# Patient Record
Sex: Female | Born: 1960 | Race: White | Hispanic: No | Marital: Single | Smoking: Never smoker
Health system: Southern US, Community
[De-identification: ages and names within clinical notes are randomized; demographics above are authoritative.]

## PROBLEM LIST (undated history)

## (undated) ENCOUNTER — Emergency Department (HOSPITAL_COMMUNITY)
Admission: EM | Disposition: A | Discharge status: Home / Self Care | Payer: Medicare Other | Attending: Otolaryngology | Admitting: Otolaryngology

## (undated) DIAGNOSIS — R51 Headache: Secondary | ICD-10-CM

## (undated) DIAGNOSIS — R0602 Shortness of breath: Secondary | ICD-10-CM

## (undated) DIAGNOSIS — M797 Fibromyalgia: Secondary | ICD-10-CM

## (undated) DIAGNOSIS — G4730 Sleep apnea, unspecified: Secondary | ICD-10-CM

## (undated) DIAGNOSIS — G709 Myoneural disorder, unspecified: Secondary | ICD-10-CM

## (undated) DIAGNOSIS — F32A Depression, unspecified: Secondary | ICD-10-CM

## (undated) DIAGNOSIS — N179 Acute kidney failure, unspecified: Secondary | ICD-10-CM

## (undated) DIAGNOSIS — M1990 Unspecified osteoarthritis, unspecified site: Secondary | ICD-10-CM

## (undated) DIAGNOSIS — J189 Pneumonia, unspecified organism: Secondary | ICD-10-CM

## (undated) DIAGNOSIS — G8929 Other chronic pain: Secondary | ICD-10-CM

## (undated) DIAGNOSIS — F419 Anxiety disorder, unspecified: Secondary | ICD-10-CM

## (undated) DIAGNOSIS — F329 Major depressive disorder, single episode, unspecified: Secondary | ICD-10-CM

## (undated) DIAGNOSIS — J069 Acute upper respiratory infection, unspecified: Secondary | ICD-10-CM

## (undated) DIAGNOSIS — C539 Malignant neoplasm of cervix uteri, unspecified: Secondary | ICD-10-CM

## (undated) HISTORY — PX: TRACHEOSTOMY: SUR1362

## (undated) HISTORY — PX: NECK SURGERY: SHX720

---

## 2001-10-10 ENCOUNTER — Encounter: Payer: Self-pay | Admitting: General Practice

## 2001-10-10 ENCOUNTER — Ambulatory Visit (HOSPITAL_COMMUNITY): Admission: RE | Admit: 2001-10-10 | Discharge: 2001-10-10 | Payer: Self-pay | Admitting: General Practice

## 2001-11-15 ENCOUNTER — Ambulatory Visit (HOSPITAL_COMMUNITY): Admission: RE | Admit: 2001-11-15 | Discharge: 2001-11-15 | Payer: Self-pay | Admitting: Orthopedic Surgery

## 2002-09-14 ENCOUNTER — Inpatient Hospital Stay (HOSPITAL_COMMUNITY): Admission: EM | Admit: 2002-09-14 | Discharge: 2002-09-16 | Payer: Self-pay | Admitting: Emergency Medicine

## 2002-09-14 ENCOUNTER — Encounter: Payer: Self-pay | Admitting: Emergency Medicine

## 2006-09-22 ENCOUNTER — Emergency Department (HOSPITAL_COMMUNITY): Admission: EM | Admit: 2006-09-22 | Discharge: 2006-09-22 | Payer: Self-pay | Admitting: Emergency Medicine

## 2006-09-24 ENCOUNTER — Inpatient Hospital Stay (HOSPITAL_COMMUNITY): Admission: EM | Admit: 2006-09-24 | Discharge: 2006-09-27 | Payer: Self-pay | Admitting: Emergency Medicine

## 2006-09-24 ENCOUNTER — Ambulatory Visit: Payer: Self-pay | Admitting: Family Medicine

## 2006-11-01 ENCOUNTER — Ambulatory Visit (HOSPITAL_COMMUNITY): Admission: RE | Admit: 2006-11-01 | Discharge: 2006-11-01 | Payer: Self-pay | Admitting: Neurological Surgery

## 2008-10-03 ENCOUNTER — Inpatient Hospital Stay (HOSPITAL_COMMUNITY): Admission: EM | Admit: 2008-10-03 | Discharge: 2008-10-06 | Payer: Self-pay | Admitting: Emergency Medicine

## 2008-10-03 ENCOUNTER — Ambulatory Visit: Payer: Self-pay | Admitting: Family Medicine
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
## 2011-03-15 NOTE — Discharge Summary (Signed)
NAMESHAMAR, KRACKE             ACCOUNT NO.:  [id number]   MEDICAL RECORD NO.:  [id number]          PATIENT TYPE:  INP   LOCATION:  5532                         FACILITY:  [facility]   PHYSICIAN:  Pearlean Brownie, M.D.DATE OF BIRTH:  Jul 12, 1961   DATE OF ADMISSION:  10/03/2008  DATE OF DISCHARGE:  10/05/2008                               DISCHARGE SUMMARY   REASON FOR HOSPITALIZATION:  Presyncopal event, myalgias, possible  aspiration pneumonia, and dehydration.   FOLLOW UP APPOINTMENTS:  The patient is to follow up with Dr. Steva Ready,  phone number is [phone].  She will need an appointment in 1-3 days  after discharge.  I did call the office and leave a message regarding  this since it was a Sunday.  The patient was also instructed to call  tomorrow for an appointment.   DISCHARGE MEDICATIONS:  1. Percocet 10/325 mg 1 tablet three times a day.  2. Zanaflex 8 mg 1 tablet three times a day.  3. MS Contin 30 mg 1 tablet two times a day.  4. Valium 5 mg 1 tablet three times a day.   These prescriptions were given as she has a pain contract with her  orthopedist and she is to be continued on all of her home medications,  he is the one who prescribed this.  The patient is to stop taking her  Celebrex as well as her Toprol-XL.  She will need to discuss taking the  Toprol-XL with her primary care doctor as her blood pressure was low  during this admission.  We want to make sure that blood pressure does  not drop because she was taking this medicine.   LABORATORY DATA:  The patient does have two blood cultures that were  drawn, December 4, that are currently no growth to date.  A BMET drawn  December 6 on discharge was completely normal.  Sodium 142, potassium  4.3, chloride 109, bicarb 27, glucose 88, BUN 6, creatinine 0.92,  calcium 8.9.  CBC, on December 4 was normal.  White blood cell count  9.1, her hemoglobin was a little bit low at 11.4, platelets were 283.  Urinalysis showed  small leukocyte esterase, no nitrites, large blood, 15  ketones, small bilirubin, specific gravity 1.035.  Microscopic showed  many squamous epithelial cells, many bacteria, hyaline casts, and  oxalate crystals.  Urine pregnancy test was negative.  Chest x-ray done  on December 4, showed right middle and lower lobe air space disease  consistent with pneumonia.  Follow up plain films recommended.  Films on  December 5, showed minimal improvement in right lower lobe pneumonia,  subsegmental atelectasis, right middle lobe was again noted.  Head CT  done December 4, was negative for bleed or other acute intracranial  process.  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   DISCHARGE DIAGNOSES:  Dehydration, hypotension, questionable pneumonia,  chronic pain, and acute renal failure.   DISCHARGE CONDITION:  The patient is stable.  Upon discharge, she says  that she is quite sleepy; however, she is afebrile and off antibiotics,  and feels like  she could take her medicine and sleep at home as well as  here in the hospital.   HOSPITAL COURSE:  1. Presyncope/dehydration/hypotension.  The patient was admitted for a      presyncopal episode, also was found to be very dehydrated.  She was      rehydrated with a few boluses as well as IV fluids, and currently      is taking p.o. quite well, and drinking normally and eating      normally.  She was a little hypotensive on admission; however, this      resolved status post hydration.  This was presumed to be due to      dehydration.  2. Possible pneumonia.  The patient did have chest x-rays consistent      with pneumonia; however, she was given a Z-Pak the week prior to      her admission.  She was given a couple days of azithromycin and      ceftriaxone here, but they were stopped the day prior to discharge      as she remained afebrile with a normal white blood cell count.  It      is possible that she has a virus  that is causing her coarse cough,      making her look sleepy and tired.  Discussed with Dr. Deirdre Priest      prior to discharge.  We will not send her home on any more      antibiotics as she was previously treated for this and seems to be      doing better in regards to breathing and having no fever.  She did      receive the pneumonia vaccine here in the hospital.  3. Acute renal failure.  The patient's creatinine initially was 1.6;      however, on discharge was 0.92 and resolved after rehydration, this      is likely secondary to dehydration.  4. Chronic pain.  The patient with a pain contract with her      orthopedist.  We continued her on home medications.  She will be      discharged on her home medications as well without any      prescriptions as she has already had these at home.  These      medications are, Percocet 10/325 three times a day, Zanaflex 8 mg      three times a day, MS Contin 30 mg two times a day, and Valium 5 mg      three times a day.  For prophylaxis, the patient was put on Lovenox      here in the hospital.   DISPOSITION:  The patient did ambulate here with Dr. Deirdre Priest without  too much of a problem and the decision was made to discharge her home  today.  She will follow up with Dr. Steva Ready, her primary doctor.   FOLLOW UP ISSUES:  1. The patient had blood cultures to follow in the hospital.  2. The patient will need follow up in the next couple days for her      possible pneumonia/cough just to make sure she is doing okay.  The      patient was informed that she does have fevers let us know.   DISCHARGE MEDICATIONS:  1. Percocet 10/325 mg 3 tablets a day.  2. Zanaflex 8 mg 1 tablet three times a day.  3. MS Contin 30 mg 1 tablet 3  times a day.  4. Valium 5 mg 1 tablet three times a day.      Alanda Amass, M.D.  Electronically Signed      Pearlean Brownie, M.D.  Electronically Signed    JH/MEDQ  D:  10/05/2008  T:  10/05/2008  Job:   [id number]

## 2011-03-15 NOTE — Discharge Summary (Signed)
NAMETRULEE, HAMSTRA             ACCOUNT NO.:  [id number]   MEDICAL RECORD NO.:  [id number]          PATIENT TYPE:  INP   LOCATION:  5532                         FACILITY:  [facility]   PHYSICIAN:  Pearlean Brownie, M.D.DATE OF BIRTH:  29-Mar-1961   DATE OF ADMISSION:  10/03/2008  DATE OF DISCHARGE:  10/06/2008                               DISCHARGE SUMMARY   ADDENDUM:  The patient was scheduled to be discharged on October 05, 2008, but was held overnight due to her elevated blood pressures of  192/102, 172/96, and 173/98.  She was started on metoprolol 25 mg b.i.d.  with a subsequent morning blood pressure of 151/100.  After speaking  with her primary care physician's office, we did restart her amlodipine  5 mg 1-1/2 tablets per day as well as adding lisinopril 10 mg daily for  blood pressure control.  We decided not to increase the metoprolol dose  to 50 mg b.i.d. as her previous prescription indicated as her heart rate  was maintained in the 60s-70s.  I did speak with Ms. Knapper's primary  care physician, Dr. Richardean Sale office who indicated that she was seen on  July 21, 2008.  Prescriptions prescribed by Dr. Steva Ready include  pravastatin, metoprolol 50 mg b.i.d., and amlodipine 5 mg 1-1/2 tablets  daily.  I did make a followup appointment for her for Wednesday,  October 08, 2008, at 9:45 a.m.  We spoke with Dr. Thad Ranger at [facility] and verified that Ms. Samet has a Optician, dispensing with him and  that he prescribed the following:  1. Celebrex 200 mg daily.  2. MS Contin 30 mg p.o. b.i.d.  3. Zanaflex 8 mg t.i.d.  4. Oxycodone 10/325 one p.o. t.i.d. #90.   Ms. Michalski was last seen on September 22, 2008, at his office.  I  discussed with Ms. Manson Passey and her husband her blood pressure and blood  pressure medicines, explained her new regimen until she sees her primary  care physician.  She denied any chest pain, any increased shortness of  breath, any headaches, or any  vision changes.  She was instructed to  call the emergency department, call her PCP, or go to the emergency  department if she experiences chest pain, shortness of breath,  headaches, vision changes, or any other concerns.      Helane Rima, MD  Electronically Signed      Pearlean Brownie, M.D.  Electronically Signed    EW/MEDQ  D:  10/06/2008  T:  10/07/2008  Job:  [id number]
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
## 2011-03-18 NOTE — Op Note (Signed)
[facility]  Patient:    Whitney, Marks Visit Number: [id number] MRN: [id number]          Service Type: DSU Location: DAY Attending Physician:  Skip Mayer Dictated by:   Georges Lynch Darrelyn Hillock, M.D. Proc. Date: 11/15/01 Admit Date:  11/15/2001 Discharge Date: 11/15/2001                             Operative Report  SURGEON:  Windy Fast A. Darrelyn Hillock, M.D.  ASSISTANT:  Nurse.  PREOPERATIVE DIAGNOSIS:  Adhesive capsulitis with bursitis, left shoulder.  POSTOPERATIVE DIAGNOSIS:  Adhesive capsulitis with bursitis, left shoulder.  OPERATION: 1. Closed manipulation under general anesthesia of the left shoulder. 2. Injection left shoulder with 1 cc of Depo-Medrol, 5 cc of 0.5% Marcaine    with epinephrine.  DESCRIPTION OF PROCEDURE:  Under general anesthesia, a closed manipulation was carried out of the left shoulder with ease. Following this, I then did a sterile prep of her right shoulder and injected her with 1 cc of Depo-Medrol, 5 cc of 0.5% Marcaine. She did return to the recovery room with no problems. She will be placed in a sling and have ice to the shoulder and she will be seen in the office to start therapy on Monday, January 20, or prior to that for some problems. Dictated by:   Georges Lynch Darrelyn Hillock, M.D. Attending Physician:  Skip Mayer DD:  11/15/01 TD:  11/18/01 Job: [id number]
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
## 2011-08-04 LAB — BASIC METABOLIC PANEL
BUN: 11 mg/dL (ref 6–23)
Calcium: 8.9 mg/dL (ref 8.4–10.5)
Calcium: 9.1 mg/dL (ref 8.4–10.5)
Chloride: 112 mEq/L (ref 96–112)
Creatinine, Ser: 0.84 mg/dL (ref 0.4–1.2)
Creatinine, Ser: 0.85 mg/dL (ref 0.4–1.2)
GFR calc Af Amer: >60 mL/min (ref >60)
Glucose, Bld: 116 mg/dL — ABNORMAL HIGH (ref 70–99)
Potassium: 3.6 mEq/L (ref 3.5–5.1)
Potassium: 4.2 mEq/L (ref 3.5–5.1)
Potassium: 4.3 mEq/L (ref 3.5–5.1)

## 2011-08-04 LAB — URINALYSIS, ROUTINE W REFLEX MICROSCOPIC
Glucose, UA: NEGATIVE mg/dL
Ketones, ur: 15 mg/dL — AB
Specific Gravity, Urine: 1.035 — ABNORMAL HIGH (ref 1.005–1.030)
Urobilinogen, UA: 1 mg/dL (ref 0.0–1.0)
pH: 6 (ref 5.0–8.0)

## 2011-08-04 LAB — CULTURE, BLOOD (ROUTINE X 2): Culture: NO GROWTH

## 2011-08-04 LAB — CBC
MCHC: 33.9 g/dL (ref 30.0–36.0)
MCHC: 34.8 g/dL (ref 30.0–36.0)
MCV: 90.3 fL (ref 78.0–100.0)
RBC: 3.73 MIL/uL — ABNORMAL LOW (ref 3.87–5.11)
RDW: 13 % (ref 11.5–15.5)
RDW: 13.1 % (ref 11.5–15.5)
WBC: 9.1 10*3/uL (ref 4.0–10.5)

## 2011-08-04 LAB — POCT I-STAT, CHEM 8
BUN: 22 mg/dL (ref 6–23)
Creatinine, Ser: 1.6 mg/dL — ABNORMAL HIGH (ref 0.4–1.2)
Hemoglobin: 13.9 g/dL (ref 12.0–15.0)
Potassium: 3 mEq/L — ABNORMAL LOW (ref 3.5–5.1)
Sodium: 141 mEq/L (ref 135–145)

## 2011-08-04 LAB — DIFFERENTIAL
Eosinophils Absolute: 0.7 10*3/uL (ref 0.0–0.7)
Lymphocytes Relative: 30 % (ref 12–46)
Neutrophils Relative %: 58 % (ref 43–77)

## 2011-08-04 LAB — POCT PREGNANCY, URINE: Preg Test, Ur: NEGATIVE

## 2011-10-30 ENCOUNTER — Emergency Department (HOSPITAL_COMMUNITY): Payer: Medicare Other

## 2011-10-30 ENCOUNTER — Other Ambulatory Visit: Payer: Self-pay

## 2011-10-30 ENCOUNTER — Inpatient Hospital Stay (HOSPITAL_COMMUNITY): Payer: Medicare Other

## 2011-10-30 ENCOUNTER — Inpatient Hospital Stay (HOSPITAL_COMMUNITY)
Admission: EM | Admit: 2011-10-30 | Discharge: 2011-11-04 | DRG: 871 | Disposition: A | Discharge status: Home Health | Payer: Medicare Other | Attending: Internal Medicine | Admitting: Internal Medicine

## 2011-10-30 DIAGNOSIS — G9340 Encephalopathy, unspecified: Secondary | ICD-10-CM

## 2011-10-30 DIAGNOSIS — R6520 Severe sepsis without septic shock: Secondary | ICD-10-CM

## 2011-10-30 DIAGNOSIS — J690 Pneumonitis due to inhalation of food and vomit: Secondary | ICD-10-CM

## 2011-10-30 DIAGNOSIS — S279XXS Injury of unspecified intrathoracic organ, sequela: Secondary | ICD-10-CM

## 2011-10-30 DIAGNOSIS — R4182 Altered mental status, unspecified: Secondary | ICD-10-CM | POA: Diagnosis present

## 2011-10-30 DIAGNOSIS — R5081 Fever presenting with conditions classified elsewhere: Secondary | ICD-10-CM | POA: Diagnosis present

## 2011-10-30 DIAGNOSIS — J988 Other specified respiratory disorders: Secondary | ICD-10-CM | POA: Diagnosis present

## 2011-10-30 DIAGNOSIS — A419 Sepsis, unspecified organism: Secondary | ICD-10-CM

## 2011-10-30 DIAGNOSIS — Z79899 Other long term (current) drug therapy: Secondary | ICD-10-CM

## 2011-10-30 DIAGNOSIS — Z8701 Personal history of pneumonia (recurrent): Secondary | ICD-10-CM

## 2011-10-30 DIAGNOSIS — J9600 Acute respiratory failure, unspecified whether with hypoxia or hypercapnia: Secondary | ICD-10-CM

## 2011-10-30 DIAGNOSIS — M129 Arthropathy, unspecified: Secondary | ICD-10-CM | POA: Diagnosis present

## 2011-10-30 DIAGNOSIS — J13 Pneumonia due to Streptococcus pneumoniae: Secondary | ICD-10-CM | POA: Diagnosis present

## 2011-10-30 DIAGNOSIS — J9503 Malfunction of tracheostomy stoma: Secondary | ICD-10-CM

## 2011-10-30 DIAGNOSIS — G8921 Chronic pain due to trauma: Secondary | ICD-10-CM

## 2011-10-30 DIAGNOSIS — R6521 Severe sepsis with septic shock: Secondary | ICD-10-CM

## 2011-10-30 DIAGNOSIS — J189 Pneumonia, unspecified organism: Secondary | ICD-10-CM

## 2011-10-30 DIAGNOSIS — R0603 Acute respiratory distress: Secondary | ICD-10-CM

## 2011-10-30 DIAGNOSIS — J129 Viral pneumonia, unspecified: Secondary | ICD-10-CM

## 2011-10-30 DIAGNOSIS — IMO0002 Reserved for concepts with insufficient information to code with codable children: Secondary | ICD-10-CM | POA: Diagnosis not present

## 2011-10-30 DIAGNOSIS — I959 Hypotension, unspecified: Secondary | ICD-10-CM | POA: Diagnosis present

## 2011-10-30 DIAGNOSIS — R509 Fever, unspecified: Secondary | ICD-10-CM

## 2011-10-30 DIAGNOSIS — J80 Acute respiratory distress syndrome: Secondary | ICD-10-CM

## 2011-10-30 DIAGNOSIS — J984 Other disorders of lung: Secondary | ICD-10-CM

## 2011-10-30 DIAGNOSIS — J398 Other specified diseases of upper respiratory tract: Secondary | ICD-10-CM | POA: Diagnosis present

## 2011-10-30 HISTORY — DX: Pneumonia, unspecified organism: J18.9

## 2011-10-30 HISTORY — DX: Acute upper respiratory infection, unspecified: J06.9

## 2011-10-30 HISTORY — DX: Sleep apnea, unspecified: G47.30

## 2011-10-30 HISTORY — DX: Malignant neoplasm of cervix uteri, unspecified: C53.9

## 2011-10-30 HISTORY — DX: Unspecified osteoarthritis, unspecified site: M19.90

## 2011-10-30 HISTORY — DX: Depression, unspecified: F32.A

## 2011-10-30 HISTORY — DX: Major depressive disorder, single episode, unspecified: F32.9

## 2011-10-30 HISTORY — DX: Headache: R51

## 2011-10-30 HISTORY — DX: Fibromyalgia: M79.7

## 2011-10-30 HISTORY — DX: Shortness of breath: R06.02

## 2011-10-30 HISTORY — DX: Anxiety disorder, unspecified: F41.9

## 2011-10-30 HISTORY — DX: Myoneural disorder, unspecified: G70.9

## 2011-10-30 HISTORY — DX: Other chronic pain: G89.29

## 2011-10-30 LAB — PRO B NATRIURETIC PEPTIDE: Pro B Natriuretic peptide (BNP): 1850 pg/mL — ABNORMAL HIGH (ref 0–125)

## 2011-10-30 LAB — BLOOD GAS, ARTERIAL
Acid-base deficit: 4.3 mmol/L — ABNORMAL HIGH (ref 0.0–2.0)
Acid-base deficit: 7.4 mmol/L — ABNORMAL HIGH (ref 0.0–2.0)
Acid-base deficit: 7.4 mmol/L — ABNORMAL HIGH (ref 0.0–2.0)
Bicarbonate: 19.6 mEq/L — ABNORMAL LOW (ref 20.0–24.0)
Bicarbonate: 17 mEq/L — ABNORMAL LOW (ref 20.0–24.0)
Drawn by: 347671
FIO2: 1 %
FIO2: 0.4 %
FIO2: 0.4 %
MECHVT: 500 mL
MECHVT: 400 mL
MECHVT: 350 mL
O2 Saturation: 98.5 %
O2 Saturation: 99.4 %
Patient temperature: 98.6
RATE: 32 resp/min
TCO2: 18 mmol/L (ref 0–100)
TCO2: 15.8 mmol/L (ref 0–100)
TCO2: 15.9 mmol/L (ref 0–100)
TCO2: 16.3 mmol/L (ref 0–100)
pCO2 arterial: 34.1 mmHg — ABNORMAL LOW (ref 35.0–45.0)
pCO2 arterial: 30.9 mmHg — ABNORMAL LOW (ref 35.0–45.0)
pCO2 arterial: 32.4 mmHg — ABNORMAL LOW (ref 35.0–45.0)
pH, Arterial: 7.378 (ref 7.350–7.400)
pH, Arterial: 7.358 (ref 7.350–7.400)
pH, Arterial: 7.314 — ABNORMAL LOW (ref 7.350–7.400)
pO2, Arterial: 302 mmHg — ABNORMAL HIGH (ref 80.0–100.0)
pO2, Arterial: 128 mmHg — ABNORMAL HIGH (ref 80.0–100.0)

## 2011-10-30 LAB — CARDIAC PANEL(CRET KIN+CKTOT+MB+TROPI)
CK, MB: 8.8 ng/mL (ref 0.3–4.0)
Total CK: 193 U/L — ABNORMAL HIGH (ref 7–177)
Troponin I: 1.27 ng/mL (ref <0.30)

## 2011-10-30 LAB — BASIC METABOLIC PANEL
CO2: 18 mEq/L — ABNORMAL LOW (ref 19–32)
Calcium: 6.9 mg/dL — ABNORMAL LOW (ref 8.4–10.5)
GFR calc non Af Amer: 48 mL/min — ABNORMAL LOW (ref >90)
Sodium: 139 mEq/L (ref 135–145)

## 2011-10-30 LAB — URINALYSIS, ROUTINE W REFLEX MICROSCOPIC
Ketones, ur: NEGATIVE mg/dL
Leukocytes, UA: NEGATIVE
Nitrite: NEGATIVE
Protein, ur: NEGATIVE mg/dL
Urobilinogen, UA: 0.2 mg/dL (ref 0.0–1.0)

## 2011-10-30 LAB — INFLUENZA PANEL BY PCR (TYPE A & B)
Influenza A By PCR: NEGATIVE
Influenza B By PCR: NEGATIVE

## 2011-10-30 LAB — COMPREHENSIVE METABOLIC PANEL
ALT: 12 U/L (ref 0–35)
Albumin: 3.1 g/dL — ABNORMAL LOW (ref 3.5–5.2)
Alkaline Phosphatase: 58 U/L (ref 39–117)
BUN: 24 mg/dL — ABNORMAL HIGH (ref 6–23)
Chloride: 102 mEq/L (ref 96–112)
GFR calc Af Amer: 31 mL/min — ABNORMAL LOW (ref >90)
Glucose, Bld: 75 mg/dL (ref 70–99)
Potassium: 5.8 mEq/L — ABNORMAL HIGH (ref 3.5–5.1)
Sodium: 135 mEq/L (ref 135–145)
Total Bilirubin: 0.4 mg/dL (ref 0.3–1.2)
Total Protein: 7.3 g/dL (ref 6.0–8.3)

## 2011-10-30 LAB — MRSA PCR SCREENING: MRSA by PCR: POSITIVE — AB

## 2011-10-30 LAB — PROTIME-INR: INR: 1.31 (ref 0.00–1.49)

## 2011-10-30 LAB — CORTISOL: Cortisol, Plasma: 24.6 ug/dL

## 2011-10-30 LAB — PROCALCITONIN: Procalcitonin: 27.86 ng/mL

## 2011-10-30 LAB — CARBOXYHEMOGLOBIN: Methemoglobin: 1.5 % (ref 0.0–1.5)

## 2011-10-30 LAB — CBC
HCT: 33.9 % — ABNORMAL LOW (ref 36.0–46.0)
Hemoglobin: 10.7 g/dL — ABNORMAL LOW (ref 12.0–15.0)
WBC: 20 10*3/uL — ABNORMAL HIGH (ref 4.0–10.5)

## 2011-10-30 LAB — LACTIC ACID, PLASMA: Lactic Acid, Venous: 1.7 mmol/L (ref 0.5–2.2)

## 2011-10-30 LAB — APTT: aPTT: 32 seconds (ref 24–37)

## 2011-10-30 LAB — AMYLASE: Amylase: 140 U/L — ABNORMAL HIGH (ref 0–105)

## 2011-10-30 MED ORDER — NOREPINEPHRINE BITARTRATE 1 MG/ML IJ SOLN
2.0000 ug/min | INTRAMUSCULAR | Status: DC
  Administered 2011-10-30: 2 ug/min via INTRAVENOUS
  Filled 2011-10-30: qty 4

## 2011-10-30 MED ORDER — PIPERACILLIN-TAZOBACTAM 3.375 G IVPB: 3.3750 g | Freq: Once | INTRAVENOUS | Status: DC

## 2011-10-30 MED ORDER — MIDAZOLAM HCL 2 MG/2ML IJ SOLN
INTRAMUSCULAR | Status: AC
  Administered 2011-10-30: 2 mg via INTRAVENOUS
  Filled 2011-10-30: qty 2

## 2011-10-30 MED ORDER — SUCCINYLCHOLINE CHLORIDE 20 MG/ML IJ SOLN
INTRAMUSCULAR | Status: AC
  Administered 2011-10-30: 120 mg
  Filled 2011-10-30: qty 5

## 2011-10-30 MED ORDER — VANCOMYCIN HCL IN DEXTROSE 1-5 GM/200ML-% IV SOLN
1000.0000 mg | Freq: Once | INTRAVENOUS | Status: AC
  Administered 2011-10-30: 1000 mg via INTRAVENOUS
  Filled 2011-10-30: qty 200

## 2011-10-30 MED ORDER — MIDAZOLAM HCL 2 MG/2ML IJ SOLN
2.0000 mg | Freq: Once | INTRAMUSCULAR | Status: AC
  Administered 2011-10-30: 2 mg via INTRAVENOUS

## 2011-10-30 MED ORDER — FENTANYL BOLUS VIA INFUSION
50.0000 ug | Freq: Four times a day (QID) | INTRAVENOUS | Status: DC | PRN
  Filled 2011-10-30: qty 100

## 2011-10-30 MED ORDER — HEPARIN SODIUM (PORCINE) 5000 UNIT/ML IJ SOLN
5000.0000 [IU] | Freq: Three times a day (TID) | INTRAMUSCULAR | Status: DC
  Administered 2011-10-30 – 2011-11-04 (×15): 5000 [IU] via SUBCUTANEOUS
  Filled 2011-10-30 (×19): qty 1

## 2011-10-30 MED ORDER — PROPOFOL 10 MG/ML IV EMUL
INTRAVENOUS | Status: AC
  Administered 2011-10-30: 11.13 ug/kg/min via INTRAVENOUS
  Filled 2011-10-30: qty 100

## 2011-10-30 MED ORDER — NALOXONE HCL 0.4 MG/ML IJ SOLN
INTRAMUSCULAR | Status: AC
  Filled 2011-10-30: qty 1

## 2011-10-30 MED ORDER — LIDOCAINE HCL (CARDIAC) 20 MG/ML IV SOLN
INTRAVENOUS | Status: AC
  Administered 2011-10-30: 14:00:00
  Filled 2011-10-30: qty 5

## 2011-10-30 MED ORDER — PIPERACILLIN-TAZOBACTAM 3.375 G IVPB 30 MIN
3.3750 g | Freq: Once | INTRAVENOUS | Status: AC
  Administered 2011-10-30: 3.375 g via INTRAVENOUS
  Filled 2011-10-30: qty 50

## 2011-10-30 MED ORDER — VANCOMYCIN HCL 1000 MG IV SOLR
750.0000 mg | INTRAVENOUS | Status: DC
  Administered 2011-10-31: 750 mg via INTRAVENOUS
  Filled 2011-10-30: qty 750

## 2011-10-30 MED ORDER — ROCURONIUM BROMIDE 50 MG/5ML IV SOLN
50.0000 mg | Freq: Once | INTRAVENOUS | Status: AC
  Administered 2011-10-30: 10 mg via INTRAVENOUS

## 2011-10-30 MED ORDER — LIDOCAINE HCL (CARDIAC) 20 MG/ML IV SOLN
INTRAVENOUS | Status: AC
  Filled 2011-10-30: qty 5

## 2011-10-30 MED ORDER — SODIUM CHLORIDE 0.9 % IV BOLUS (SEPSIS)
1000.0000 mL | Freq: Once | INTRAVENOUS | Status: AC
  Administered 2011-10-30: 1000 mL via INTRAVENOUS

## 2011-10-30 MED ORDER — ROCURONIUM BROMIDE 50 MG/5ML IV SOLN: 60.0000 mg | Freq: Once | INTRAVENOUS | Status: DC

## 2011-10-30 MED ORDER — NALOXONE HCL 1 MG/ML IJ SOLN
INTRAMUSCULAR | Status: AC
  Administered 2011-10-30: 09:00:00
  Filled 2011-10-30: qty 2

## 2011-10-30 MED ORDER — SODIUM CHLORIDE 0.9 % IV SOLN
250.0000 mL | INTRAVENOUS | Status: DC | PRN
  Administered 2011-10-30 – 2011-11-01 (×2): 250 mL via INTRAVENOUS

## 2011-10-30 MED ORDER — FENTANYL CITRATE 0.05 MG/ML IJ SOLN
INTRAMUSCULAR | Status: AC
  Administered 2011-10-30: 100 ug via INTRAVENOUS
  Filled 2011-10-30: qty 2

## 2011-10-30 MED ORDER — ROCURONIUM BROMIDE 50 MG/5ML IV SOLN
INTRAVENOUS | Status: AC
  Filled 2011-10-30: qty 2

## 2011-10-30 MED ORDER — PHENYLEPHRINE HCL 10 MG/ML IJ SOLN
30.0000 ug/min | INTRAVENOUS | Status: DC
  Administered 2011-10-30: 40 ug/min via INTRAVENOUS
  Filled 2011-10-30 (×2): qty 1

## 2011-10-30 MED ORDER — DEXTROSE 5 % IV SOLN
500.0000 mg | INTRAVENOUS | Status: DC
  Administered 2011-10-31 – 2011-11-02 (×3): 500 mg via INTRAVENOUS
  Filled 2011-10-30 (×4): qty 500

## 2011-10-30 MED ORDER — HYDROCORTISONE SOD SUCCINATE 100 MG IJ SOLR
50.0000 mg | Freq: Four times a day (QID) | INTRAMUSCULAR | Status: DC
  Administered 2011-10-30 – 2011-11-01 (×8): 50 mg via INTRAVENOUS
  Filled 2011-10-30 (×10): qty 2

## 2011-10-30 MED ORDER — SODIUM CHLORIDE 0.9 % IV SOLN
50.0000 ug/h | INTRAVENOUS | Status: DC
  Administered 2011-10-30 – 2011-10-31 (×3): 100 ug/h via INTRAVENOUS
  Filled 2011-10-30 (×2): qty 50

## 2011-10-30 MED ORDER — ROCURONIUM BROMIDE 50 MG/5ML IV SOLN
INTRAVENOUS | Status: AC
  Administered 2011-10-30: 10 mg via INTRAVENOUS
  Filled 2011-10-30: qty 2

## 2011-10-30 MED ORDER — PANTOPRAZOLE SODIUM 40 MG IV SOLR
40.0000 mg | INTRAVENOUS | Status: DC
  Administered 2011-10-31 (×2): 40 mg via INTRAVENOUS
  Filled 2011-10-30 (×2): qty 40

## 2011-10-30 MED ORDER — PROPOFOL 10 MG/ML IV EMUL
5.0000 ug/kg/min | INTRAVENOUS | Status: DC
  Administered 2011-10-30: 11.13 ug/kg/min via INTRAVENOUS

## 2011-10-30 MED ORDER — DEXTROSE 5 % IV SOLN: 1.0000 g | Freq: Once | INTRAVENOUS | Status: DC

## 2011-10-30 MED ORDER — MIDAZOLAM BOLUS VIA INFUSION
1.0000 mg | INTRAVENOUS | Status: DC | PRN
  Filled 2011-10-30: qty 2

## 2011-10-30 MED ORDER — SODIUM CHLORIDE 0.9 % IV BOLUS (SEPSIS)
2000.0000 mL | Freq: Once | INTRAVENOUS | Status: AC
  Administered 2011-10-30: 2000 mL via INTRAVENOUS

## 2011-10-30 MED ORDER — ETOMIDATE 2 MG/ML IV SOLN
INTRAVENOUS | Status: AC
  Filled 2011-10-30: qty 20

## 2011-10-30 MED ORDER — ETOMIDATE 2 MG/ML IV SOLN
INTRAVENOUS | Status: AC
  Administered 2011-10-30: 20 mg
  Filled 2011-10-30: qty 20

## 2011-10-30 MED ORDER — SODIUM CHLORIDE 0.9 % IV SOLN
INTRAVENOUS | Status: DC
  Administered 2011-10-30 (×2): via INTRAVENOUS

## 2011-10-30 MED ORDER — PIPERACILLIN-TAZOBACTAM 3.375 G IVPB
3.3750 g | Freq: Three times a day (TID) | INTRAVENOUS | Status: DC
  Administered 2011-10-30 – 2011-11-02 (×9): 3.375 g via INTRAVENOUS
  Filled 2011-10-30 (×11): qty 50

## 2011-10-30 MED ORDER — SODIUM CHLORIDE 0.9 % IV SOLN
2.0000 mg/h | INTRAVENOUS | Status: DC
  Administered 2011-10-30 – 2011-10-31 (×2): 2 mg/h via INTRAVENOUS
  Filled 2011-10-30 (×3): qty 10

## 2011-10-30 MED ORDER — SUCCINYLCHOLINE CHLORIDE 20 MG/ML IJ SOLN
INTRAMUSCULAR | Status: AC
  Filled 2011-10-30: qty 5

## 2011-10-30 NOTE — ED Provider Notes (Signed)
History     CSN: [id number]  Arrival date & time 10/30/11  0827   First MD Initiated Contact with Patient 10/30/11 [phone]      Chief Complaint  Patient presents with  . Shortness of Breath    (Consider location/radiation/quality/duration/timing/severity/associated sxs/prior treatment) HPI A LEVEL 5 CAVEAT PERTAINS DUE TO ALTERED MENTAL STATUS, RESPIRATORY DISTRESS, URGENT NEED FOR INTERVENTION Patient presents via EMS with altered mental status. Per EMS they had been called out once earlier in the night for high fever. Patient has a history of prior tracheostomy and had pneumonia 2 weeks ago but had been recovering. Last night her husband state she began to have fever and chills and was more sleepy than usual. He gave Tylenol and ibuprofen and then early this morning was not able to awaken her. Patient is not able to contribute to the history to 2 altered mental status. Per EMS her O2 sat was 68% upon their arrival. She had increased to 100% on a nonrebreather facemask oxygen.  Past Medical History  Diagnosis Date  . Pneumonia   . Chronic pain   . Arthritis     Past Surgical History  Procedure Date  . Neck surgery     No family history on file.  History  Substance Use Topics  . Smoking status: Not on file  . Smokeless tobacco: Not on file  . Alcohol Use:     OB History    Grav Para Term Preterm Abortions TAB SAB Ect Mult Living                  Review of Systems ROS reviewed and otherwise negative except for mentioned in HPI  Allergies  Codeine; Talwin; and Zantac  Home Medications  No current outpatient prescriptions on file.  BP 92/61  Pulse 75  Temp(Src) 98.6 F (37 C) (Rectal)  Resp 18  Ht 5\' 2"  (1.575 m)  Wt 139 lb 5.3 oz (63.2 kg)  BMI 25.48 kg/m2  SpO2 98% rectal temp 99.7 Vitals reviewed Physical Exam Physical Examination: General appearance - somnolent, ill appearing, and in moderate distress Mental status - somnolent, minimally  responsive Eyes - pupils equal and reactive, no scleral icterus Mouth - mucous membranes moist, pharynx normal without lesions, prulent sputum/vomitus in mouth and around mouth Chest - decreased breath sounds bilaterally, course rhonchi bilaterally, symmetric air entry Heart - normal rate, regular rhythm, normal S1, S2, no murmurs, rubs, clicks or gallops Abdomen - soft, nontender, nondistended, no masses or organomegaly Neurological - not responding to questions, moving all extremities, does respond to pain Musculoskeletal - no joint tenderness, deformity or swelling Extremities - peripheral pulses normal, no pedal edema, no clubbing or cyanosis Skin - normal coloration and turgor, no rashes  ED Course  INTUBATION Date/Time: 10/30/2011 3:52 PM Performed by: Ethelda Chick Authorized by: Ethelda Chick Consent: Verbal consent not obtained. The procedure was performed in an emergent situation. Patient identity confirmed: anonymous protocol, patient vented/unresponsive Time out: Immediately prior to procedure a "time out" was called to verify the correct patient, procedure, equipment, support staff and site/side marked as required. Indications: respiratory distress, respiratory failure and airway protection Intubation method: direct Patient status: paralyzed (RSI) Preoxygenation: BVM and nonrebreather mask Sedatives: etomidate Paralytic: succinylcholine Laryngoscope size: Mac 4 Tube size: 6.0 mm Tube type: cuffed Number of attempts: 3 Ventilation between attempts: BVM Cricoid pressure: no Cords visualized: yes Post-procedure assessment: chest rise and ETCO2 monitor Breath sounds: equal Cuff inflated: yes Tube secured with: adhesive  tape Chest x-ray interpreted by me and radiologist. Chest x-ray findings: endotracheal tube in appropriate position Patient tolerance: Patient tolerated the procedure well with no immediate complications. Comments: 100% visualization of glottic  opening and cords on each intubation attempt- however 7.5 and 7.0 ETTs would not pass, pt successfully intubated with 6.0 tube.  No desaturation between attempts.  Pt has hx of tracheostomy and likely tracheal narrowing.    (including critical care time) 9:36 AM- pt intubated by me, and I have discussed the case with Dr. Molli Knock, pulmonary critical care- PCCM will see patient in the ED  1:32 PM pt evaluated multiple times.  Had become hypotensive on propofol, this was stopped and left subclavian placed by Dr. Marchelle Gearing, pt started on levophed drip and has received approx 4L NS.  SBP 99 on 13mcg/min levophed.  When returning from CT apparently subclavian line was caught per report from staff and may have become dislodged.  Fluid and blood leaking from line.  Fluid stopped, portable CXR ordered by me to confirm line placement or complication.  Nursing is paging PCCM now as well.   CRITICAL CARE Performed by: Ethelda Chick   Total critical care time: 75  Critical care time was exclusive of separately billable procedures and treating other patients.  Critical care was necessary to treat or prevent imminent or life-threatening deterioration.  Critical care was time spent personally by me on the following activities: development of treatment plan with patient and/or surrogate as well as nursing, discussions with consultants, evaluation of patient's response to treatment, examination of patient, obtaining history from patient or surrogate, ordering and performing treatments and interventions, ordering and review of laboratory studies, ordering and review of radiographic studies, pulse oximetry and re-evaluation of patient's condition.  Labs Reviewed  CBC - Abnormal; Notable for the following:    WBC 20.0 (*)    RBC 3.71 (*)    Hemoglobin 10.7 (*)    HCT 33.9 (*)    All other components within normal limits  COMPREHENSIVE METABOLIC PANEL - Abnormal; Notable for the following:    Potassium 5.8 (*)  MODERATE HEMOLYSIS   BUN 24 (*)    Creatinine, Ser 2.08 (*)    Albumin 3.1 (*)    GFR calc non Af Amer 27 (*)    GFR calc Af Amer 31 (*)    All other components within normal limits  LACTIC ACID, PLASMA - Abnormal; Notable for the following:    Lactic Acid, Venous 3.9 (*)    All other components within normal limits  BLOOD GAS, ARTERIAL - Abnormal; Notable for the following:    pCO2 arterial 34.1 (*)    pO2, Arterial 302.0 (*)    Bicarbonate 19.6 (*)    Acid-base deficit 4.3 (*)    All other components within normal limits  PHOSPHORUS - Abnormal; Notable for the following:    Phosphorus 5.3 (*)    All other components within normal limits  AMYLASE - Abnormal; Notable for the following:    Amylase 140 (*)    All other components within normal limits  PRO B NATRIURETIC PEPTIDE - Abnormal; Notable for the following:    Pro B Natriuretic peptide (BNP) 1850.0 (*)    All other components within normal limits  PROTIME-INR - Abnormal; Notable for the following:    Prothrombin Time 16.5 (*)    All other components within normal limits  PROCALCITONIN  URINALYSIS, ROUTINE W REFLEX MICROSCOPIC  MAGNESIUM  LIPASE, BLOOD  APTT  CULTURE, BLOOD (ROUTINE  X 2)  CULTURE, BLOOD (ROUTINE X 2)  URINE CULTURE  INFLUENZA PANEL BY PCR  CARDIAC PANEL(CRET KIN+CKTOT+MB+TROPI)  CARDIAC PANEL(CRET KIN+CKTOT+MB+TROPI)  CORTISOL  URINE CULTURE  CULTURE, RESPIRATORY  URINALYSIS, ROUTINE W REFLEX MICROSCOPIC  STREP PNEUMONIAE URINARY ANTIGEN  LEGIONELLA ANTIGEN, URINE  BLOOD GAS, ARTERIAL  CARBOXYHEMOGLOBIN  CARBOXYHEMOGLOBIN  MRSA PCR SCREENING   Ct Head Wo Contrast  10/30/2011  *RADIOLOGY REPORT*  Clinical Data: Altered mental status.  Fever.  Respiratory failure.  CT HEAD WITHOUT CONTRAST  Technique:  Contiguous axial images were obtained from the base of the skull through the vertex without contrast.  Comparison: 10/03/2008  Findings: There is evidence of old bilateral cerebellar infarcts as  well as old bilateral posterior parietal infarcts with secondary encephalomalacia.  No acute intracranial hemorrhage, infarction, or mass lesion.  There is an air-fluid level right maxillary sinus with slight mucosal thickening in the ethmoid and left maxillary sinuses.  IMPRESSION:  1.  Right maxillary sinusitis. 2.  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  Original Report Authenticated By: Gwynn Burly, M.D.   Dg Chest Portable 1 View  10/30/2011  *RADIOLOGY REPORT*  Clinical Data: Possible central line complication  PORTABLE CHEST - 1 VIEW  Comparison: Prior films same day  Findings: Stable NG tube and endotracheal tube position.  Patchy airspace disease left greater than right again noted.  The left subclavian central line has changed position.  The line is retracted with tip of the line curved just inferior to left clavicle at the junction with second rib. Reposition of the lung is recommended.  No definite diagnostic pneumothorax.  IMPRESSION: Again noted bilateral airspace disease left greater than right. Stable endotracheal and NG tube position.  There is retraction of the left subclavian central line with tip just inferior to mid left clavicle at the junction with the rib cage.  Reposition of the line is recommended.  No definite diagnostic pneumothorax.  I discussed with Dr. Karma Ganja.  Original Report Authenticated By: Natasha Mead, M.D.   Dg Chest Port 1 View  10/30/2011  *RADIOLOGY REPORT*  Clinical Data: Left subclavian central line placement  PORTABLE CHEST - 1 VIEW  Comparison: Prior films same day  Findings: Stable endotracheal and NG tube position.  There is a left subclavian central line with tip in SVC.  No diagnostic pneumothorax.  Again noted bilateral patchy asymmetric airspace disease left greater than right.  IMPRESSION:  There is a left subclavian central line with tip in SVC.  No diagnostic pneumothorax.  Again noted bilateral patchy asymmetric airspace disease left greater than right.   Original Report Authenticated By: Natasha Mead, M.D.   Dg Chest Portable 1 View  10/30/2011  *RADIOLOGY REPORT*  Clinical Data: Fever, respiratory distress  PORTABLE CHEST - 1 VIEW  Comparison: 10/04/2008  Findings: Cardiomediastinal silhouette is stable.  Endotracheal tube in place with tip 2.2 cm above the carina. NG tube in place. There is patchy asymmetric airspace disease left greater than right.  This most likely due to asymmetric pneumonia rather than pulmonary edema.  Clinical correlation is necessary.  No diagnostic pneumothorax.  IMPRESSION: . There is patchy asymmetric airspace disease left greater than right.  This most likely due to asymmetric pneumonia rather than pulmonary edema.  Clinical correlation is necessary.  Original Report Authenticated By: Natasha Mead, M.D.     1. Respiratory distress   2. Fever   3. Sepsis       MDM  Pt seen immediately upon arrival.  Pt somnolent, 100%  on NRB face mask.  Placed on monitor, O2 continued, second IV placed.  Rectal temp 99.7.  Pt noted to have sputum/vomitus around mouth, suctioned, then with weak cough had increased sputum in mouth. Pt intubated by me for airway protection.  Unable to pass larger tubes- 7.5/7.0 would not pass with 100% glottic view, pt successfully intubated with 6.0 ETT.  Code sepsis called.  Labs drawn including cultures, broad spectrum antibiotics given.  Pt to be admitted to [facility].          Ethelda Chick, MD 10/30/11 1556

## 2011-10-30 NOTE — ED Notes (Signed)
ED staff preparing patient to get intubated right now
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
## 2011-10-30 NOTE — ED Notes (Signed)
Portable chest xray at bedside. RT at bedside drawing ABG

## 2011-10-30 NOTE — ED Notes (Signed)
EDP, 2RNs, Consulting civil engineer, and RT at bedside.

## 2011-10-30 NOTE — Procedures (Signed)
Central Venous Catheter Insertion Procedure Note Whitney Marks [id number] 23-Dec-1960  Procedure: Insertion of Central Venous Catheter Indications: Drug and/or fluid administration  Procedure Details Consent: Risks of procedure as well as the alternatives and risks of each were explained to the (patient/caregiver).  Consent for procedure obtained. Time Out: Verified patient identification, verified procedure, site/side was marked, verified correct patient position, special equipment/implants available, medications/allergies/relevent history reviewed, required imaging and test results available.  Performed  Maximum sterile technique was used including antiseptics, cap, gloves, gown, hand hygiene, mask and sheet. Skin prep: Chlorhexidine; local anesthetic administered A antimicrobial bonded/coated triple lumen catheter was placed in the left subclavian vein using the Seldinger technique.  Evaluation Blood flow good Complications: No apparent complications Patient did tolerate procedure well. Chest X-ray ordered to verify placement.  CXR: pending.  Whitney Marks 10/30/2011, 7:33 PM

## 2011-10-30 NOTE — Procedures (Signed)
Central Venous Catheter Insertion Procedure Note Whitney Marks [id number] 1961-02-25  Procedure: Insertion of Central Venous Catheter Indications: Assessment of intravascular volume, Drug and/or fluid administration and Frequent blood sampling  Procedure Details Consent: Risks of procedure as well as the alternatives and risks of each were explained to the (patient/caregiver).  Consent for procedure obtained. Time Out: Verified patient identification, verified procedure, site/side was marked, verified correct patient position, special equipment/implants available, medications/allergies/relevent history reviewed, required imaging and test results available.  Performed  Maximum sterile technique was used including antiseptics, cap, gloves, gown, hand hygiene, mask and sheet. Skin prep: Chlorhexidine; local anesthetic administered A antimicrobial bonded/coated triple lumen catheter was placed in the left subclavian vein using the Seldinger technique.  Evaluation Blood flow good Complications: No apparent complications Patient did tolerate procedure well. Chest X-ray ordered to verify placement.  CXR: pending.  Whitney Marks 10/30/2011, 12:18 PM  Left IJ failed cannulation under [place] x 3 attempts. So blind left subclavian attempted x 1 with success immediately

## 2011-10-30 NOTE — ED Notes (Signed)
EDP at bedside  

## 2011-10-30 NOTE — ED Notes (Addendum)
Per ems pt from home, sat at 67% room air at home. Pt on NRB. BP 87.52, sat 100& on NRB, and HR 89, sinus. Coarse crackle heard in bilateral lung. Pt appeared lethargic, able to open eye, responsive to touch and pain

## 2011-10-30 NOTE — Progress Notes (Addendum)
ANTIBIOTIC CONSULT NOTE - INITIAL  Pharmacy Consult for Vanc/Zosyn/Azithro Indication: aspiration v post flu CAP  No Known Allergies  Patient Measurements: Weight: 132 lb (59.875 kg) Adjusted Body Weight:   Vital Signs: Temp: 99.7 F (37.6 C) (12/30 0928) Temp src: Rectal (12/30 0928) BP: 87/52 mmHg (12/30 0838) Pulse Rate: 91  (12/30 0838) Intake/Output from previous day:   Intake/Output from this shift:    Labs: No results found for this basename: WBC:3,HGB:3,PLT:3,LABCREA:3,CREATININE:3 in the last 72 hours CrCl is unknown because there is no height on file for the current visit. No results found for this basename: VANCOTROUGH:2,VANCOPEAK:2,VANCORANDOM:2,GENTTROUGH:2,GENTPEAK:2,GENTRANDOM:2,TOBRATROUGH:2,TOBRAPEAK:2,TOBRARND:2,AMIKACINPEAK:2,AMIKACINTROU:2,AMIKACIN:2, in the last 72 hours   Microbiology: No results found for this or any previous visit (from the past 720 hour(s)).  Medical History: Past Medical History  Diagnosis Date  . Pneumonia   . Chronic pain    Medications:  Scheduled:    . etomidate      . heparin  5,000 Units Subcutaneous Q8H  . lidocaine (cardiac) 100 mg/60ml      . midazolam  2 mg Intravenous Once  . naloxone      . pantoprazole (PROTONIX) IV  40 mg Intravenous Q24H  . piperacillin-tazobactam  3.375 g Intravenous Once  . rocuronium      . sodium chloride  1,000 mL Intravenous Once  . sodium chloride  1,000 mL Intravenous Once  . succinylcholine      . vancomycin  1,000 mg Intravenous Once  . DISCONTD: cefTRIAXone (ROCEPHIN)  IV  1 g Intravenous Once  . DISCONTD: piperacillin-tazobactam (ZOSYN)  IV  3.375 g Intravenous Once   Infusions:    . fentaNYL infusion INTRAVENOUS    . midazolam (VERSED) infusion    . DISCONTD: propofol 11.13 mcg/kg/min (10/30/11 1610)   Assessment:  50 YOF admitted this AM with acute respiratory failure requiring intubation  Patient is to start vanc/zosyn/azithro for aspiration vs post flu CAP  Vanc  1gm and Zosyn 3.375 gm x 1 sent to the ED at 0900 this AM.  Due to access issues, pt is now receiving zosyn and 1st vanc dose will follow shortly.  Tmax 99.7, WBC 20, reported Wt 59.9 kg, Scr pending  Ucx and Bcx collected  Goal of Therapy:  Vancomycin trough level 15-20 mcg/ml  Plan:   Azithromycin 500 mg IV q24h, first dose now  Will follow up BMET for Scr and redose vanc/zosyn when lab resulted.  Geoffry Paradise Thi 10/30/2011,10:53 AM   Addendum:  1:25PM:  Scr 2.08, Norm CrCl 37 ml/min, CG CrCl 31 ml/min.    Plan:  Vancomycin 750 mg IV q24h, Zosyn 3.375gm VI q8h - infuse over 4 hours.  Follow renal function and adjust as needed.

## 2011-10-30 NOTE — ED Notes (Signed)
Blood cultures were obtains prior to start of antibiotics.

## 2011-10-30 NOTE — ED Notes (Signed)
Narcan 1mg given

## 2011-10-31 ENCOUNTER — Inpatient Hospital Stay (HOSPITAL_COMMUNITY): Payer: Medicare Other

## 2011-10-31 DIAGNOSIS — J988 Other specified respiratory disorders: Secondary | ICD-10-CM

## 2011-10-31 DIAGNOSIS — J398 Other specified diseases of upper respiratory tract: Secondary | ICD-10-CM

## 2011-10-31 LAB — CARDIAC PANEL(CRET KIN+CKTOT+MB+TROPI)
CK, MB: 10.2 ng/mL (ref 0.3–4.0)
Relative Index: 5.7 — ABNORMAL HIGH (ref 0.0–2.5)
Total CK: 133 U/L (ref 7–177)

## 2011-10-31 LAB — MAGNESIUM: Magnesium: 1.8 mg/dL (ref 1.5–2.5)

## 2011-10-31 LAB — BASIC METABOLIC PANEL
Chloride: 112 mEq/L (ref 96–112)
GFR calc Af Amer: 80 mL/min — ABNORMAL LOW (ref >90)
Potassium: 4.2 mEq/L (ref 3.5–5.1)
Sodium: 139 mEq/L (ref 135–145)

## 2011-10-31 LAB — BLOOD GAS, ARTERIAL
Acid-base deficit: 7.8 mmol/L — ABNORMAL HIGH (ref 0.0–2.0)
Drawn by: 340271
FIO2: 0.4 %
MECHVT: 300 mL
Mode: POSITIVE
PEEP: 5 cmH2O
Patient temperature: 98.6
Pressure support: 5 cmH2O
TCO2: 15.9 mmol/L (ref 0–100)
pCO2 arterial: 37.6 mmHg (ref 35.0–45.0)
pH, Arterial: 7.314 — ABNORMAL LOW (ref 7.350–7.400)
pO2, Arterial: 119 mmHg — ABNORMAL HIGH (ref 80.0–100.0)

## 2011-10-31 LAB — LEGIONELLA ANTIGEN, URINE: Legionella Antigen, Urine: NEGATIVE

## 2011-10-31 LAB — URINE CULTURE
Colony Count: NO GROWTH
Culture  Setup Time: 201212301853
Culture: NO GROWTH

## 2011-10-31 LAB — CBC
Platelets: 241 10*3/uL (ref 150–400)
RDW: 14.8 % (ref 11.5–15.5)
WBC: 16 10*3/uL — ABNORMAL HIGH (ref 4.0–10.5)

## 2011-10-31 MED ORDER — DIAZEPAM 5 MG PO TABS: 5.0000 mg | ORAL_TABLET | Freq: Three times a day (TID) | ORAL | Status: DC | PRN

## 2011-10-31 MED ORDER — CHLORHEXIDINE GLUCONATE 0.12 % MT SOLN
15.0000 mL | Freq: Two times a day (BID) | OROMUCOSAL | Status: DC
  Administered 2011-10-31 – 2011-11-02 (×5): 15 mL via OROMUCOSAL
  Filled 2011-10-31 (×6): qty 15

## 2011-10-31 MED ORDER — VANCOMYCIN HCL 1000 MG IV SOLR
750.0000 mg | Freq: Three times a day (TID) | INTRAVENOUS | Status: DC
  Administered 2011-10-31 – 2011-11-01 (×2): 750 mg via INTRAVENOUS
  Filled 2011-10-31 (×5): qty 750

## 2011-10-31 MED ORDER — BIOTENE DRY MOUTH MT LIQD: 15.0000 mL | OROMUCOSAL | Status: DC | PRN

## 2011-10-31 MED ORDER — PREGABALIN 50 MG PO CAPS: 150.0000 mg | ORAL_CAPSULE | Freq: Two times a day (BID) | ORAL | Status: DC

## 2011-10-31 MED ORDER — VITAMINS A & D EX OINT
TOPICAL_OINTMENT | CUTANEOUS | Status: AC
  Filled 2011-10-31: qty 10

## 2011-10-31 MED ORDER — OXYCODONE-ACETAMINOPHEN 10-325 MG PO TABS: 1.0000 | ORAL_TABLET | Freq: Three times a day (TID) | ORAL | Status: DC

## 2011-10-31 MED ORDER — PREGABALIN 75 MG PO CAPS
150.0000 mg | ORAL_CAPSULE | Freq: Two times a day (BID) | ORAL | Status: DC
  Administered 2011-10-31 – 2011-11-04 (×8): 150 mg via ORAL
  Filled 2011-10-31: qty 1
  Filled 2011-10-31: qty 3
  Filled 2011-10-31: qty 1
  Filled 2011-10-31: qty 2
  Filled 2011-10-31: qty 1
  Filled 2011-10-31 (×2): qty 2
  Filled 2011-10-31: qty 1
  Filled 2011-10-31: qty 3

## 2011-10-31 MED ORDER — OXYCODONE-ACETAMINOPHEN 5-325 MG PO TABS
1.0000 | ORAL_TABLET | Freq: Three times a day (TID) | ORAL | Status: DC
  Administered 2011-10-31 – 2011-11-03 (×7): 1 via ORAL
  Filled 2011-10-31 (×8): qty 1

## 2011-10-31 MED ORDER — TRAZODONE HCL 100 MG PO TABS
100.0000 mg | ORAL_TABLET | Freq: Every day | ORAL | Status: DC
  Administered 2011-10-31 – 2011-11-03 (×4): 100 mg via ORAL
  Filled 2011-10-31 (×5): qty 1

## 2011-10-31 MED ORDER — OXYCODONE HCL 5 MG PO TABS
5.0000 mg | ORAL_TABLET | Freq: Three times a day (TID) | ORAL | Status: DC
  Administered 2011-10-31 – 2011-11-03 (×7): 5 mg via ORAL
  Filled 2011-10-31 (×7): qty 1

## 2011-10-31 MED ORDER — BIOTENE DRY MOUTH MT LIQD
15.0000 mL | Freq: Four times a day (QID) | OROMUCOSAL | Status: DC
  Administered 2011-10-31 – 2011-11-01 (×3): 15 mL via OROMUCOSAL
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
## 2011-10-31 NOTE — Progress Notes (Signed)
ANTIBIOTIC CONSULT NOTE - FOLLOW UP  Pharmacy Consult for Vancomycin/Zosyn Indication: Aspiration PNA, CAP  Allergies  Allergen Reactions  . Codeine   . Talwin Other (See Comments)    hallucinates  . Zantac Other (See Comments)    blisters    Patient Measurements: Height: 5\' 2"  (157.5 cm) Weight: 146 lb 9.7 oz (66.5 kg) IBW/kg (Calculated) : 50.1  Adjusted Body Weight:   Vital Signs: Temp: 98.1 F (36.7 C) (12/31 1300) Temp src: Core (Comment) (12/31 0400) BP: 137/83 mmHg (12/31 1300) Pulse Rate: 65  (12/31 1300) Intake/Output from previous day: 12/30 0701 - 12/31 0700 In: 4992 [I.V.:4792; NG/GT:150; IV Piggyback:50] Out: 650 [Urine:650] Intake/Output from this shift: Total I/O In: 1340.5 [I.V.:928; IV Piggyback:412.5] Out: 175 [Urine:175]  Labs:  [facility] 10/31/11 0440 10/30/11 1635 10/30/11 1000  WBC 16.0* -- 20.0*  HGB 10.0* -- 10.7*  PLT 241 -- 256  LABCREA -- -- --  CREATININE 0.95 1.28* 2.08*   Estimated Creatinine Clearance: 64.1 ml/min (by C-G formula based on Cr of 0.95).  CrCl 80 ml/min (normalized)   Microbiology: Blood x2  12/30: ngtd Urine  12/30: pending MRSA 12/30: positive Sputum 12/30: pending  Anti-infectives     Start     Dose/Rate Route Frequency Ordered Stop   10/31/11 1100   vancomycin (VANCOCIN) 750 mg in sodium chloride 0.9 % 150 mL IVPB        750 mg 150 mL/hr over 60 Minutes Intravenous Every 24 hours 10/30/11 1329     10/30/11 1900  piperacillin-tazobactam (ZOSYN) IVPB 3.375 g       3.375 g 12.5 mL/hr over 240 Minutes Intravenous Every 8 hours 10/30/11 1330     10/30/11 1200   azithromycin (ZITHROMAX) 500 mg in dextrose 5 % 250 mL IVPB        500 mg 250 mL/hr over 60 Minutes Intravenous Every 24 hours 10/30/11 1127     10/30/11 1030   cefTRIAXone (ROCEPHIN) 1 g in dextrose 5 % 50 mL IVPB  Status:  Discontinued        1 g 100 mL/hr over 30 Minutes Intravenous  Once 10/30/11 1021 10/30/11 1047   10/30/11 0915   vancomycin  (VANCOCIN) IVPB 1000 mg/200 mL premix        1,000 mg 200 mL/hr over 60 Minutes Intravenous  Once 10/30/11 0905 10/30/11 1346   10/30/11 0915   piperacillin-tazobactam (ZOSYN) IVPB 3.375 g  Status:  Discontinued        3.375 g 12.5 mL/hr over 240 Minutes Intravenous  Once 10/30/11 0905 10/30/11 0907   10/30/11 0915  piperacillin-tazobactam (ZOSYN) IVPB 3.375 g       3.375 g 100 mL/hr over 30 Minutes Intravenous  Once 10/30/11 0908 10/30/11 1248          Assessment: 50 YOF admitted this AM with acute respiratory failure requiring intubation  Day #2 vanc/zosyn/azithro for aspiration vs post flu CAP  Scr is significantly improving, CrCl now 80 ml/min Tmax 99.7, WBC down 16k  Sputum, Ucx and Bcx pending, MRSA PCR positive   Goal of Therapy:  Vancomycin trough level 15-20 mcg/ml  Plan:   Increase vancomycin 750mg  IV q8h  Check trough at steady state  Continue Zosyn 3.375gm IV q8h (4hr extended infusions)  Follow renal function & cultures  Rollene Fare 10/31/2011,1:34 PM Pager: [phone]
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
## 2011-10-31 NOTE — Progress Notes (Signed)
Pt extubated to 2L [place] at 1615 - no stridor - coarse bbs - hr 67 - Sat 100% - strong productive cough  Can Lucci rrt, rcp

## 2011-11-01 ENCOUNTER — Encounter (HOSPITAL_COMMUNITY): Payer: Self-pay

## 2011-11-01 ENCOUNTER — Inpatient Hospital Stay (HOSPITAL_COMMUNITY): Payer: Medicare Other

## 2011-11-01 DIAGNOSIS — J9600 Acute respiratory failure, unspecified whether with hypoxia or hypercapnia: Secondary | ICD-10-CM

## 2011-11-01 DIAGNOSIS — J988 Other specified respiratory disorders: Secondary | ICD-10-CM

## 2011-11-01 DIAGNOSIS — J398 Other specified diseases of upper respiratory tract: Secondary | ICD-10-CM

## 2011-11-01 DIAGNOSIS — J129 Viral pneumonia, unspecified: Secondary | ICD-10-CM

## 2011-11-01 LAB — BASIC METABOLIC PANEL
CO2: 22 mEq/L (ref 19–32)
Chloride: 110 mEq/L (ref 96–112)
Sodium: 139 mEq/L (ref 135–145)

## 2011-11-01 LAB — CBC
Platelets: 240 10*3/uL (ref 150–400)
RBC: 3.27 MIL/uL — ABNORMAL LOW (ref 3.87–5.11)
WBC: 12.8 10*3/uL — ABNORMAL HIGH (ref 4.0–10.5)

## 2011-11-01 MED ORDER — MORPHINE SULFATE CR 15 MG PO TB12
15.0000 mg | ORAL_TABLET | Freq: Two times a day (BID) | ORAL | Status: DC
  Administered 2011-11-01 – 2011-11-04 (×6): 15 mg via ORAL
  Filled 2011-11-01 (×6): qty 1

## 2011-11-01 MED ORDER — FUROSEMIDE 10 MG/ML IJ SOLN
20.0000 mg | Freq: Once | INTRAMUSCULAR | Status: AC
  Administered 2011-11-01: 20 mg via INTRAVENOUS
  Filled 2011-11-01: qty 2

## 2011-11-01 MED ORDER — HYDROCORTISONE SOD SUCCINATE 100 MG IJ SOLR
50.0000 mg | Freq: Two times a day (BID) | INTRAMUSCULAR | Status: DC
  Administered 2011-11-01 – 2011-11-02 (×2): 50 mg via INTRAVENOUS
  Filled 2011-11-01 (×3): qty 1

## 2011-11-01 NOTE — Progress Notes (Signed)
Pt sleeping/resting comfortably, HR staying in the 40's SB, Pt asymptomatic, Notified Elink MD.  Dellia Cloud RN
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
## 2011-11-01 NOTE — Progress Notes (Signed)
Pt gave verbal consent for Pari Lombard, pt's mother, to have information on her care.  Pt's mother lives in [place] and is unable to come to the hospital because she currently has a stomach virus.  Spoke with mary Pagel and updated her on pt's status.  Dellia Cloud RN

## 2011-11-02 MED ORDER — HYDROCORTISONE SOD SUCCINATE 100 MG IJ SOLR
50.0000 mg | Freq: Every day | INTRAMUSCULAR | Status: DC
  Administered 2011-11-03: 50 mg via INTRAVENOUS
  Filled 2011-11-02: qty 1

## 2011-11-02 MED ORDER — MUPIROCIN 2 % EX OINT
1.0000 "application " | TOPICAL_OINTMENT | Freq: Two times a day (BID) | CUTANEOUS | Status: DC
  Administered 2011-11-02 – 2011-11-04 (×5): 1 via NASAL
  Filled 2011-11-02 (×2): qty 22

## 2011-11-02 MED ORDER — CHLORHEXIDINE GLUCONATE CLOTH 2 % EX PADS
6.0000 | MEDICATED_PAD | Freq: Every day | CUTANEOUS | Status: DC
  Administered 2011-11-03 – 2011-11-04 (×2): 6 via TOPICAL

## 2011-11-02 MED ORDER — DEXTROSE 5 % IV SOLN
1.0000 g | INTRAVENOUS | Status: DC
  Administered 2011-11-02 – 2011-11-03 (×2): 1 g via INTRAVENOUS
  Filled 2011-11-02 (×3): qty 10

## 2011-11-02 MED ORDER — VITAMINS A & D EX OINT
TOPICAL_OINTMENT | CUTANEOUS | Status: AC
  Administered 2011-11-02: 19:00:00
  Filled 2011-11-02: qty 5

## 2011-11-02 MED ORDER — BIOTENE DRY MOUTH MT LIQD
15.0000 mL | Freq: Two times a day (BID) | OROMUCOSAL | Status: DC
  Administered 2011-11-02 – 2011-11-04 (×4): 15 mL via OROMUCOSAL

## 2011-11-02 MED ORDER — STARCH (THICKENING) PO POWD: ORAL | Status: DC | PRN

## 2011-11-02 MED ORDER — AZITHROMYCIN 500 MG PO TABS
500.0000 mg | ORAL_TABLET | ORAL | Status: DC
  Administered 2011-11-03: 500 mg via ORAL
  Filled 2011-11-02: qty 1

## 2011-11-02 MED ORDER — FOOD THICKENER (THICKENUP CLEAR)
1.0000 | ORAL | Status: DC | PRN
  Filled 2011-11-02: qty 1.4

## 2011-11-02 MED ORDER — INFLUENZA VIRUS VACC SPLIT PF IM SUSP
0.5000 mL | INTRAMUSCULAR | Status: AC
  Administered 2011-11-03: 0.5 mL via INTRAMUSCULAR
  Filled 2011-11-02: qty 0.5
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
## 2011-11-02 NOTE — Plan of Care (Signed)
Problem: Phase I Progression Outcomes Goal: Tolerating diet Bedside swallow assessment completed.  See full report for details.  Breck Coons [place].Ed [employer] [phone]  11/02/2011
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
## 2011-11-02 NOTE — Progress Notes (Signed)
CARE MANAGEMENT NOTE 11/02/2011  Patient:  FERNANDO, TORRY   Account Number:  [id number]  Date Initiated:  11/02/2011  Documentation initiated by:  DAVIS,RHONDA  Subjective/Objective Assessment:   pt with stridor and respiratory distress admitted and required intubation     Action/Plan:   lives at home with spouse   Anticipated DC Date:  11/05/2011   Anticipated DC Plan:  HOME W HOME HEALTH SERVICES  In-house referral  NA      DC Planning Services  NA  NA      [facility] Choice  NA   Choice offered to / List presented to:  NA   DME arranged  NA  NA      DME agency  NA     HH arranged  NA      HH agency  NA   Status of service:  In process, will continue to follow Medicare Important Message given?  NA - LOS <3 / Initial given by admissions (If response is "NO", the following Medicare IM given date fields will be blank) Date Medicare IM given:   Date Additional Medicare IM given:    Discharge Disposition:    Per UR Regulation:  Reviewed for med. necessity/level of care/duration of stay  Comments:  [id number]/Rhonda Earlene Plater, RN, BSN, CCM/CHART REVIEW FOR UR PERFORMED.

## 2011-11-03 DIAGNOSIS — J398 Other specified diseases of upper respiratory tract: Secondary | ICD-10-CM

## 2011-11-03 DIAGNOSIS — J9600 Acute respiratory failure, unspecified whether with hypoxia or hypercapnia: Secondary | ICD-10-CM

## 2011-11-03 DIAGNOSIS — J129 Viral pneumonia, unspecified: Secondary | ICD-10-CM

## 2011-11-03 DIAGNOSIS — J988 Other specified respiratory disorders: Secondary | ICD-10-CM

## 2011-11-03 LAB — BASIC METABOLIC PANEL
BUN: 15 mg/dL (ref 6–23)
Chloride: 108 mEq/L (ref 96–112)
GFR calc Af Amer: 81 mL/min — ABNORMAL LOW (ref >90)
Glucose, Bld: 92 mg/dL (ref 70–99)
Potassium: 2.9 mEq/L — ABNORMAL LOW (ref 3.5–5.1)

## 2011-11-03 LAB — CBC
HCT: 30.5 % — ABNORMAL LOW (ref 36.0–46.0)
Hemoglobin: 10 g/dL — ABNORMAL LOW (ref 12.0–15.0)
MCHC: 32.8 g/dL (ref 30.0–36.0)

## 2011-11-03 MED ORDER — OXYCODONE-ACETAMINOPHEN 5-325 MG PO TABS: 1.0000 | ORAL_TABLET | Freq: Three times a day (TID) | ORAL | Status: DC

## 2011-11-03 MED ORDER — OXYCODONE HCL 5 MG PO TABS
5.0000 mg | ORAL_TABLET | ORAL | Status: DC
  Administered 2011-11-03 – 2011-11-04 (×7): 5 mg via ORAL
  Filled 2011-11-03 (×7): qty 1

## 2011-11-03 MED ORDER — OXYCODONE-ACETAMINOPHEN 5-325 MG PO TABS
1.0000 | ORAL_TABLET | ORAL | Status: DC
  Administered 2011-11-03 – 2011-11-04 (×7): 1 via ORAL
  Filled 2011-11-03 (×7): qty 1

## 2011-11-03 MED ORDER — OXYCODONE HCL 5 MG PO TABS: 5.0000 mg | ORAL_TABLET | ORAL | Status: DC

## 2011-11-03 NOTE — Progress Notes (Signed)
Physical Therapy Treatment Patient Details Name: Whitney Marks MRN: [id number] DOB: [birth year] Today's Date: 11/03/2011 1110-1140 1GT, 1SC  PT Assessment/Plan  PT - Assessment/Plan Comments on Treatment Session: Patient improved with activity tolerance and independence.  She blames loss of balance on having had all three pain meds at once which she never does at home.  Prolonged intereaction with patient relating multiple family members deaths recently and other stressor of having a niece in high school for whom she is helping to plan a wedding for this Feb.  Encouraged patient to talk with neice about scheduling for later this or next year. PT Plan: Discharge plan remains appropriate PT Frequency: Min 3X/week Follow Up Recommendations: None;Home health PT (depending on progress) Equipment Recommended: Gilmer Mor (will assess with cane for safety before ordering for home) PT Goals  Acute Rehab PT Goals Pt will go Supine/Side to Sit: Independently PT Goal: Supine/Side to Sit - Progress: Met Pt will go Sit to Stand: with supervision PT Goal: Sit to Stand - Progress: Met Pt will go Stand to Sit: with supervision PT Goal: Stand to Sit - Progress: Progressing toward goal Pt will Ambulate: >150 feet;with supervision PT Goal: Ambulate - Progress: Progressing toward goal  PT Treatment Precautions/Restrictions  Precautions Precautions: Fall Restrictions Weight Bearing Restrictions: No Mobility (including Balance) Bed Mobility Supine to Sit: 6: Modified independent (Device/Increase time) Sitting - Scoot to Edge of Bed: 6: Modified independent (Device/Increase time) Transfers Sit to Stand: 6: Modified independent (Device/Increase time);From bed;With upper extremity assist Stand to Sit: 4: Min assist;With upper extremity assist;To chair/3-in-1 Stand to Sit Details: for safety Ambulation/Gait Ambulation/Gait: Yes Ambulation/Gait Assistance: 4: Min assist Ambulation/Gait Assistance Details  (indicate cue type and reason): initially supervision then LOB to left with min assist to recover and minguard assist for remainder of session Ambulation Distance (Feet): 400 Feet Assistive device: None Gait Pattern: Step-through pattern Gait velocity: good steady pace  Posture/Postural Control Posture/Postural Control: No significant limitations Balance Balance Assessed: Yes Static Standing Balance Static Standing - Balance Support: No upper extremity supported Static Standing - Level of Assistance: 6: Modified independent (Device/Increase time) Dynamic Standing Balance Dynamic Standing - Balance Support: No upper extremity supported Dynamic Standing - Level of Assistance: 4: Min assist (during ambulation) Exercise    End of Session PT - End of Session Activity Tolerance: Patient tolerated treatment well Patient left: in chair General Behavior During Session: [facility] for tasks performed Cognition: [facility] for tasks performed  [facility] 11/03/2011, 12:14 PM

## 2011-11-03 NOTE — Progress Notes (Signed)
Speech Language/Pathology  Pt. Seen yesterday for bedside swallow assessment.  Recommend MBS to determine safest liquid consistency.  Improvements likely in swallow function as edema/trauma subsides from recent difficult intubation. PLAN:  MBS sometime this morning.  Whitney Marks.Ed [employer] [phone]  11/03/2011
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
## 2011-11-03 NOTE — Progress Notes (Addendum)
Speech Language/Pathology Speech Pathology: Dysphagia Treatment Note  Patient was observed with :  Thin liquids to reassess for possible upgrade  Patient was noted to have s/s of aspiration : Yes:    Lung Sounds:  Upper coarse, lower diminshed Temperature:  98  Clinical Impression:  Coughing with thin water (immediate). Pt. Was scheduled for an MBS, however, pt. Is politely refusing to have it done.  SLP discussed with pt. And she reports "I'm still coughing with thin liquids today so I know the results will be that I need to have the thick liquids."  Pt. Also reported "they told me I needed it before and it's just sitting in the cases at home."  SLP agreed with pt. That she will likely need to continue with thick liquids after the test and explained reason for MBS would be to determine if pt. may be able to safely consume thinner consistency while utilizing various head positions etc. (to have her diet least restrictive as possible).  She reports wanting to stay on thickener.  SLP notified pt. Of the new thickener that is available "Thicken Up Clear"  At local drug stores ([facility] just began using new thickener) and is zanthum gum based versus corn starch.  Written information given to her and she appeared receptive.      Recommendations:  Also recommended pt. Try nectar thick at home in the next couple weeks to see how she tolerated as SLP feels she will return to her baseline swallow function as tracheal edema lessens.  If she continues to have difficulty, she can have an outpatient MBS done if desired.  No further ST services needed while in acute care or after discharge   Pain:   none Intervention Required:   No   Goals: No Goals Met  Royce Macadamia M.Ed [employer] [phone]  11/03/2011

## 2011-11-04 LAB — CULTURE, RESPIRATORY W GRAM STAIN: Special Requests: NORMAL

## 2011-11-04 MED ORDER — FOOD THICKENER (THICKENUP CLEAR): 1.0000 | ORAL | Status: DC | PRN

## 2011-11-04 MED ORDER — MOXIFLOXACIN HCL 400 MG PO TABS: 400.0000 mg | ORAL_TABLET | Freq: Every day | ORAL | Status: AC

## 2011-11-04 NOTE — Progress Notes (Signed)
11/04/11, Haven Foss RNC-MNN, [phone], CM received referral and met with pt. and her husband on 11/03/11.  Pt.  offered choice for [facility] services ordered.  Pt. has chosen [facility].  Eber Jones at [facility] contacted with order for PT per MD.  All requested information faxed to include demographics, H & P, order, PT eval. and progress notes-confirmation received.  Pt states  that she has no equipment needs.  PT recommended cane-pt and husband sate they have many canes at home.  Will follow for any other orders.
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
## 2011-11-04 NOTE — Progress Notes (Signed)
Physical Therapy Treatment Patient Details Name: Whitney Marks MRN: [id number] DOB: Nov 28, 1960 Today's Date: 11/04/2011 Time: 1020-1041  1 G PT Assessment/Plan  PT - Assessment/Plan Comments on Treatment Session: Pt still demonstrating some instability during ambulation. Pt attributes this to recenty being medicated. continue to recommend HHPT for balance training.  PT Plan: Discharge plan remains appropriate Follow Up Recommendations: Home health PT Equipment Recommended: None recommended by PT (pt states they have access to DME) PT Goals  Acute Rehab PT Goals PT Goal: Supine/Side to Sit - Progress: Met PT Goal: Sit to Stand - Progress: Met PT Goal: Stand to Sit - Progress: Met PT Goal: Ambulate - Progress: Progressing toward goal  PT Treatment Precautions/Restrictions  Precautions Precautions: Fall Restrictions Weight Bearing Restrictions: No Mobility (including Balance) Bed Mobility Bed Mobility: Yes Supine to Sit: 7: Independent Sitting - Scoot to Edge of Bed: 7: Independent Transfers Transfers: Yes Sit to Stand: 5: Supervision Stand to Sit: 5: Supervision Ambulation/Gait Ambulation/Gait: Yes Ambulation/Gait Assistance Details (indicate cue type and reason): Min-guard assist. VCs safety. Pt intermittently unsteady but no LOB Ambulation Distance (Feet): 400 Feet Assistive device: None Gait Pattern: Step-through pattern  Static Standing Balance Static Standing - Comment/# of Minutes: Had pt peform static standing wit EO/EC, narrow BOS-slight increase in wavering but no LOB Dynamic Standing Balance Dynamic Standing - Comments: had pt peform 360 degree turns-no difficulty/LOB Exercise    End of Session PT - End of Session Equipment Utilized During Treatment: Gait belt Activity Tolerance: Patient tolerated treatment well Patient left: in bed;with call bell in reach General Behavior During Session: [facility] for tasks performed Cognition: [facility] for tasks  performed  Rebeca Alert [facility] 11/04/2011, 10:47 AM [phone]

## 2011-11-04 NOTE — Progress Notes (Signed)
Patient discharged home, all discharge medications and instructions reviewed and questions answered. Pt encouraged to call for wheelchair assistance to vehicle.

## 2011-11-05 LAB — CULTURE, BLOOD (ROUTINE X 2)
Culture  Setup Time: 201212301853
Culture: NO GROWTH
Culture: NO GROWTH

## 2012-06-20 ENCOUNTER — Telehealth: Payer: Self-pay | Admitting: Internal Medicine

## 2012-06-20 NOTE — Telephone Encounter (Signed)
LMTCB

## 2012-06-20 NOTE — Telephone Encounter (Signed)
Pt stated that the student Id number should be [id number].  Letter should be faxed to Financial Aid Office @ [employer] at [phone].   Whitney Marks

## 2012-06-21 ENCOUNTER — Telehealth: Payer: Self-pay | Admitting: Internal Medicine

## 2012-06-21 NOTE — Telephone Encounter (Signed)
Returning call.  Please [phone]
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
## 2012-06-21 NOTE — Telephone Encounter (Signed)
Discharge summary faxed per pt's request.

## 2012-06-21 NOTE — Telephone Encounter (Signed)
I spoke with pt and she stated she is needing a letter from MR for her daughter Jacalyn Lefevre Renteria. She stated when she was in the hospital back end of December and beginning of January her daughter had to miss school since she was pt's only transportation. Since pt had financial aid and had to withdrawal classes her daughters financial aid advisor is requiring a letter from MR stating when the pt was in the hospital (dates), why she was in the hospital, and the outcome/resolution from the hospital stay. She is needing to have the daughter student ID # on the letter [id number]. Please advise MR thanks

## 2012-06-22 ENCOUNTER — Encounter: Payer: Self-pay | Admitting: *Deleted

## 2012-06-22 NOTE — Telephone Encounter (Signed)
Pt called to get a status update.  Whitney Marks

## 2012-06-22 NOTE — Telephone Encounter (Signed)
Letter has been printed and given to jennifer to have MR sign this.  Will fax this once this is completed. [email or web address] for the pt to make her aware.

## 2012-06-22 NOTE — Telephone Encounter (Signed)
Letter is in triage attached to cork board. Await call back. Carron Curie, CMA

## 2012-06-25 NOTE — Telephone Encounter (Signed)
I spoke with the pt and she states she needs letter faxed to Financial Aid Office at [facility] at [phone]. Letter Faxed. Carron Curie, CMA

## 2012-07-06 ENCOUNTER — Telehealth: Payer: Self-pay | Admitting: Internal Medicine

## 2012-07-06 NOTE — Telephone Encounter (Signed)
i have only called this pt once before and that was from the phone note from august but this was taken care of.  lmom for pt to call back if anything is needed.  Will sign off of this message.

## 2012-07-16 ENCOUNTER — Telehealth: Payer: Self-pay | Admitting: Internal Medicine

## 2012-07-16 NOTE — Telephone Encounter (Signed)
Whitney Dike, do you know if MR or yourself tried calling this patient? I do not see anything in EPIC. Thanks.

## 2012-07-17 NOTE — Telephone Encounter (Signed)
No I did not.  

## 2012-07-17 NOTE — Telephone Encounter (Signed)
I have not tried to call the pt. Pt was advised. Carron Curie, CMA

## 2014-01-09 ENCOUNTER — Inpatient Hospital Stay (HOSPITAL_COMMUNITY)
Admission: EM | Admit: 2014-01-09 | Discharge: 2014-01-16 | DRG: 004 | Disposition: A | Discharge status: Other Acute Inpatient Hospital | Payer: Medicare Other | Attending: Pulmonary Disease | Admitting: Pulmonary Disease

## 2014-01-09 ENCOUNTER — Encounter (HOSPITAL_COMMUNITY): Payer: Self-pay | Admitting: Emergency Medicine

## 2014-01-09 ENCOUNTER — Emergency Department (HOSPITAL_COMMUNITY): Payer: Medicare Other

## 2014-01-09 ENCOUNTER — Inpatient Hospital Stay (HOSPITAL_COMMUNITY): Payer: Medicare Other

## 2014-01-09 DIAGNOSIS — R0602 Shortness of breath: Secondary | ICD-10-CM

## 2014-01-09 DIAGNOSIS — Z888 Allergy status to other drugs, medicaments and biological substances status: Secondary | ICD-10-CM

## 2014-01-09 DIAGNOSIS — F29 Unspecified psychosis not due to a substance or known physiological condition: Secondary | ICD-10-CM | POA: Diagnosis not present

## 2014-01-09 DIAGNOSIS — R6520 Severe sepsis without septic shock: Secondary | ICD-10-CM

## 2014-01-09 DIAGNOSIS — E872 Acidosis, unspecified: Secondary | ICD-10-CM | POA: Diagnosis present

## 2014-01-09 DIAGNOSIS — J80 Acute respiratory distress syndrome: Secondary | ICD-10-CM

## 2014-01-09 DIAGNOSIS — G8921 Chronic pain due to trauma: Secondary | ICD-10-CM

## 2014-01-09 DIAGNOSIS — Z79899 Other long term (current) drug therapy: Secondary | ICD-10-CM

## 2014-01-09 DIAGNOSIS — Z885 Allergy status to narcotic agent status: Secondary | ICD-10-CM

## 2014-01-09 DIAGNOSIS — F3289 Other specified depressive episodes: Secondary | ICD-10-CM | POA: Diagnosis present

## 2014-01-09 DIAGNOSIS — G4730 Sleep apnea, unspecified: Secondary | ICD-10-CM | POA: Diagnosis present

## 2014-01-09 DIAGNOSIS — G8929 Other chronic pain: Secondary | ICD-10-CM | POA: Diagnosis present

## 2014-01-09 DIAGNOSIS — Z8541 Personal history of malignant neoplasm of cervix uteri: Secondary | ICD-10-CM

## 2014-01-09 DIAGNOSIS — R6521 Severe sepsis with septic shock: Secondary | ICD-10-CM

## 2014-01-09 DIAGNOSIS — N179 Acute kidney failure, unspecified: Secondary | ICD-10-CM | POA: Diagnosis present

## 2014-01-09 DIAGNOSIS — J9589 Other postprocedural complications and disorders of respiratory system, not elsewhere classified: Secondary | ICD-10-CM

## 2014-01-09 DIAGNOSIS — J9600 Acute respiratory failure, unspecified whether with hypoxia or hypercapnia: Secondary | ICD-10-CM

## 2014-01-09 DIAGNOSIS — F329 Major depressive disorder, single episode, unspecified: Secondary | ICD-10-CM | POA: Diagnosis present

## 2014-01-09 DIAGNOSIS — D649 Anemia, unspecified: Secondary | ICD-10-CM | POA: Diagnosis present

## 2014-01-09 DIAGNOSIS — D696 Thrombocytopenia, unspecified: Secondary | ICD-10-CM | POA: Diagnosis not present

## 2014-01-09 DIAGNOSIS — J13 Pneumonia due to Streptococcus pneumoniae: Secondary | ICD-10-CM | POA: Diagnosis present

## 2014-01-09 DIAGNOSIS — J189 Pneumonia, unspecified organism: Secondary | ICD-10-CM | POA: Diagnosis present

## 2014-01-09 DIAGNOSIS — M129 Arthropathy, unspecified: Secondary | ICD-10-CM | POA: Diagnosis present

## 2014-01-09 DIAGNOSIS — E860 Dehydration: Secondary | ICD-10-CM | POA: Diagnosis present

## 2014-01-09 DIAGNOSIS — F411 Generalized anxiety disorder: Secondary | ICD-10-CM | POA: Diagnosis present

## 2014-01-09 DIAGNOSIS — A419 Sepsis, unspecified organism: Principal | ICD-10-CM | POA: Diagnosis present

## 2014-01-09 DIAGNOSIS — IMO0001 Reserved for inherently not codable concepts without codable children: Secondary | ICD-10-CM | POA: Diagnosis present

## 2014-01-09 DIAGNOSIS — J690 Pneumonitis due to inhalation of food and vomit: Secondary | ICD-10-CM

## 2014-01-09 DIAGNOSIS — G9340 Encephalopathy, unspecified: Secondary | ICD-10-CM | POA: Diagnosis present

## 2014-01-09 DIAGNOSIS — Z23 Encounter for immunization: Secondary | ICD-10-CM

## 2014-01-09 DIAGNOSIS — J9503 Malfunction of tracheostomy stoma: Secondary | ICD-10-CM | POA: Diagnosis present

## 2014-01-09 DIAGNOSIS — R0902 Hypoxemia: Secondary | ICD-10-CM

## 2014-01-09 DIAGNOSIS — F1120 Opioid dependence, uncomplicated: Secondary | ICD-10-CM | POA: Diagnosis present

## 2014-01-09 LAB — CBC WITH DIFFERENTIAL/PLATELET
BASOS ABS: 0 10*3/uL (ref 0.0–0.1)
BASOS PCT: 0 % (ref 0–1)
Basophils Absolute: 0 10*3/uL (ref 0.0–0.1)
Basophils Relative: 0 % (ref 0–1)
EOS PCT: 0 % (ref 0–5)
Eosinophils Absolute: 0 10*3/uL (ref 0.0–0.7)
Eosinophils Absolute: 0 10*3/uL (ref 0.0–0.7)
Eosinophils Relative: 0 % (ref 0–5)
HCT: 33.7 % — ABNORMAL LOW (ref 36.0–46.0)
HCT: 31.9 % — ABNORMAL LOW (ref 36.0–46.0)
HEMOGLOBIN: 11.2 g/dL — AB (ref 12.0–15.0)
Hemoglobin: 10.3 g/dL — ABNORMAL LOW (ref 12.0–15.0)
LYMPHS PCT: 11 % — AB (ref 12–46)
LYMPHS PCT: 12 % (ref 12–46)
Lymphs Abs: 0.9 10*3/uL (ref 0.7–4.0)
Lymphs Abs: 1.1 10*3/uL (ref 0.7–4.0)
MCH: 30.1 pg (ref 26.0–34.0)
MCH: 29.8 pg (ref 26.0–34.0)
MCHC: 33.2 g/dL (ref 30.0–36.0)
MCHC: 32.3 g/dL (ref 30.0–36.0)
MCV: 90.6 fL (ref 78.0–100.0)
MCV: 92.2 fL (ref 78.0–100.0)
MONOS PCT: 2 % — AB (ref 3–12)
Monocytes Absolute: 0.2 10*3/uL (ref 0.1–1.0)
Monocytes Absolute: 0.1 10*3/uL (ref 0.1–1.0)
Monocytes Relative: 2 % — ABNORMAL LOW (ref 3–12)
NEUTROS ABS: 7.2 10*3/uL (ref 1.7–7.7)
NEUTROS PCT: 86 % — AB (ref 43–77)
NEUTROS PCT: 86 % — AB (ref 43–77)
Neutro Abs: 7.5 10*3/uL (ref 1.7–7.7)
Platelets: 229 10*3/uL (ref 150–400)
Platelets: 212 10*3/uL (ref 150–400)
RBC: 3.72 MIL/uL — ABNORMAL LOW (ref 3.87–5.11)
RBC: 3.46 MIL/uL — AB (ref 3.87–5.11)
RDW: 15 % (ref 11.5–15.5)
RDW: 15.3 % (ref 11.5–15.5)
WBC: 8.3 10*3/uL (ref 4.0–10.5)
WBC: 8.7 10*3/uL (ref 4.0–10.5)

## 2014-01-09 LAB — BLOOD GAS, ARTERIAL
Acid-base deficit: 8 mmol/L — ABNORMAL HIGH (ref 0.0–2.0)
Bicarbonate: 19.6 mEq/L — ABNORMAL LOW (ref 20.0–24.0)
DRAWN BY: 39866
FIO2: 1 %
LHR: 30 {breaths}/min
MECHVT: 320 mL
O2 SAT: 99.8 %
PATIENT TEMPERATURE: 98.8
PEEP: 5 cmH2O
TCO2: 21.5 mmol/L (ref 0–100)
pCO2 arterial: 61.1 mmHg (ref 35.0–45.0)
pH, Arterial: 7.134 — CL (ref 7.350–7.450)
pO2, Arterial: 309 mmHg — ABNORMAL HIGH (ref 80.0–100.0)

## 2014-01-09 LAB — URINALYSIS, ROUTINE W REFLEX MICROSCOPIC
BILIRUBIN URINE: NEGATIVE
Bilirubin Urine: NEGATIVE
Glucose, UA: NEGATIVE mg/dL
Glucose, UA: NEGATIVE mg/dL
KETONES UR: NEGATIVE mg/dL
KETONES UR: NEGATIVE mg/dL
LEUKOCYTES UA: NEGATIVE
Leukocytes, UA: NEGATIVE
NITRITE: NEGATIVE
Nitrite: NEGATIVE
PH: 5.5 (ref 5.0–8.0)
PROTEIN: 30 mg/dL — AB
Protein, ur: 30 mg/dL — AB
Specific Gravity, Urine: 1.014 (ref 1.005–1.030)
Specific Gravity, Urine: 1.013 (ref 1.005–1.030)
UROBILINOGEN UA: 0.2 mg/dL (ref 0.0–1.0)
Urobilinogen, UA: 0.2 mg/dL (ref 0.0–1.0)
pH: 5.5 (ref 5.0–8.0)

## 2014-01-09 LAB — POCT I-STAT 3, ART BLOOD GAS (G3+)
ACID-BASE DEFICIT: 8 mmol/L — AB (ref 0.0–2.0)
Bicarbonate: 19.9 mEq/L — ABNORMAL LOW (ref 20.0–24.0)
O2 SAT: 100 %
PO2 ART: 330 mmHg — AB (ref 80.0–100.0)
Patient temperature: 98.8
TCO2: 22 mmol/L (ref 0–100)
pCO2 arterial: 52.4 mmHg — ABNORMAL HIGH (ref 35.0–45.0)
pH, Arterial: 7.189 — CL (ref 7.350–7.450)

## 2014-01-09 LAB — PRO B NATRIURETIC PEPTIDE: Pro B Natriuretic peptide (BNP): 4590 pg/mL — ABNORMAL HIGH (ref 0–125)

## 2014-01-09 LAB — PHOSPHORUS: PHOSPHORUS: 6.2 mg/dL — AB (ref 2.3–4.6)

## 2014-01-09 LAB — I-STAT ARTERIAL BLOOD GAS, ED
Acid-base deficit: 10 mmol/L — ABNORMAL HIGH (ref 0.0–2.0)
Bicarbonate: 18.1 mEq/L — ABNORMAL LOW (ref 20.0–24.0)
O2 Saturation: 90 %
TCO2: 20 mmol/L (ref 0–100)
pCO2 arterial: 46.6 mmHg — ABNORMAL HIGH (ref 35.0–45.0)
pH, Arterial: 7.197 — CL (ref 7.350–7.450)
pO2, Arterial: 72 mmHg — ABNORMAL LOW (ref 80.0–100.0)

## 2014-01-09 LAB — PROTIME-INR
INR: 1.08 (ref 0.00–1.49)
Prothrombin Time: 13.8 seconds (ref 11.6–15.2)

## 2014-01-09 LAB — COMPREHENSIVE METABOLIC PANEL
ALK PHOS: 83 U/L (ref 39–117)
ALT: 6 U/L (ref 0–35)
AST: 31 U/L (ref 0–37)
Albumin: 3 g/dL — ABNORMAL LOW (ref 3.5–5.2)
BUN: 47 mg/dL — ABNORMAL HIGH (ref 6–23)
CO2: 16 mEq/L — ABNORMAL LOW (ref 19–32)
Calcium: 7.8 mg/dL — ABNORMAL LOW (ref 8.4–10.5)
Chloride: 104 mEq/L (ref 96–112)
Creatinine, Ser: 3.36 mg/dL — ABNORMAL HIGH (ref 0.50–1.10)
GFR calc non Af Amer: 15 mL/min — ABNORMAL LOW (ref >90)
GFR, EST AFRICAN AMERICAN: 17 mL/min — AB (ref >90)
Glucose, Bld: 126 mg/dL — ABNORMAL HIGH (ref 70–99)
Potassium: 4.2 mEq/L (ref 3.7–5.3)
SODIUM: 139 meq/L (ref 137–147)
Total Bilirubin: 0.3 mg/dL (ref 0.3–1.2)
Total Protein: 7.7 g/dL (ref 6.0–8.3)

## 2014-01-09 LAB — BASIC METABOLIC PANEL
BUN: 50 mg/dL — ABNORMAL HIGH (ref 6–23)
CHLORIDE: 102 meq/L (ref 96–112)
CO2: 17 meq/L — AB (ref 19–32)
Calcium: 8.3 mg/dL — ABNORMAL LOW (ref 8.4–10.5)
Creatinine, Ser: 3.41 mg/dL — ABNORMAL HIGH (ref 0.50–1.10)
GFR calc non Af Amer: 14 mL/min — ABNORMAL LOW (ref >90)
GFR, EST AFRICAN AMERICAN: 17 mL/min — AB (ref >90)
Glucose, Bld: 109 mg/dL — ABNORMAL HIGH (ref 70–99)
POTASSIUM: 4 meq/L (ref 3.7–5.3)
Sodium: 138 mEq/L (ref 137–147)

## 2014-01-09 LAB — URINE MICROSCOPIC-ADD ON

## 2014-01-09 LAB — TROPONIN I: Troponin I: <0.3 ng/mL (ref <0.30)

## 2014-01-09 LAB — LACTIC ACID, PLASMA: Lactic Acid, Venous: 0.8 mmol/L (ref 0.5–2.2)

## 2014-01-09 LAB — I-STAT TROPONIN, ED: TROPONIN I, POC: 0.1 ng/mL — AB (ref 0.00–0.08)

## 2014-01-09 LAB — APTT: aPTT: 39 seconds — ABNORMAL HIGH (ref 24–37)

## 2014-01-09 LAB — MAGNESIUM: MAGNESIUM: 2.2 mg/dL (ref 1.5–2.5)

## 2014-01-09 MED ORDER — DEXTROSE 5 % IV SOLN: 500.0000 mg | Freq: Once | INTRAVENOUS | Status: DC

## 2014-01-09 MED ORDER — SODIUM CHLORIDE 0.9 % IV BOLUS (SEPSIS): 1000.0000 mL | Freq: Once | INTRAVENOUS | Status: DC

## 2014-01-09 MED ORDER — BIOTENE DRY MOUTH MT LIQD
15.0000 mL | Freq: Four times a day (QID) | OROMUCOSAL | Status: DC
  Administered 2014-01-10 – 2014-01-16 (×27): 15 mL via OROMUCOSAL

## 2014-01-09 MED ORDER — ALBUTEROL SULFATE (2.5 MG/3ML) 0.083% IN NEBU
INHALATION_SOLUTION | RESPIRATORY_TRACT | Status: AC
  Filled 2014-01-09: qty 3

## 2014-01-09 MED ORDER — FENTANYL CITRATE 0.05 MG/ML IJ SOLN: 50.0000 ug | Freq: Once | INTRAMUSCULAR | Status: DC

## 2014-01-09 MED ORDER — SODIUM CHLORIDE 0.9 % IV SOLN
0.0000 ug/h | INTRAVENOUS | Status: DC
  Administered 2014-01-09: 100 ug/h via INTRAVENOUS
  Administered 2014-01-10: 250 ug/h via INTRAVENOUS
  Administered 2014-01-11: 400 ug/h via INTRAVENOUS
  Filled 2014-01-09 (×5): qty 50

## 2014-01-09 MED ORDER — SODIUM CHLORIDE 0.9 % IV BOLUS (SEPSIS)
1000.0000 mL | Freq: Once | INTRAVENOUS | Status: AC
  Administered 2014-01-09: 1000 mL via INTRAVENOUS

## 2014-01-09 MED ORDER — HEPARIN SODIUM (PORCINE) 5000 UNIT/ML IJ SOLN
5000.0000 [IU] | Freq: Three times a day (TID) | INTRAMUSCULAR | Status: DC
  Administered 2014-01-09 – 2014-01-13 (×11): 5000 [IU] via SUBCUTANEOUS
  Filled 2014-01-09 (×14): qty 1

## 2014-01-09 MED ORDER — DEXTROSE 5 % IV SOLN: 1.0000 g | Freq: Once | INTRAVENOUS | Status: DC

## 2014-01-09 MED ORDER — LATANOPROST 0.005 % OP SOLN
1.0000 [drp] | Freq: Every day | OPHTHALMIC | Status: DC
  Administered 2014-01-10 – 2014-01-15 (×7): 1 [drp] via OPHTHALMIC
  Filled 2014-01-09 (×2): qty 2.5

## 2014-01-09 MED ORDER — FENTANYL BOLUS VIA INFUSION
50.0000 ug | INTRAVENOUS | Status: DC | PRN
  Filled 2014-01-09: qty 100

## 2014-01-09 MED ORDER — MIDAZOLAM HCL 2 MG/2ML IJ SOLN: 2.0000 mg | Freq: Once | INTRAMUSCULAR | Status: DC

## 2014-01-09 MED ORDER — CHLORHEXIDINE GLUCONATE 0.12 % MT SOLN: 15.0000 mL | Freq: Two times a day (BID) | OROMUCOSAL | Status: DC

## 2014-01-09 MED ORDER — SODIUM CHLORIDE 0.9 % IV SOLN: 750.0000 mL | INTRAVENOUS | Status: DC | PRN

## 2014-01-09 MED ORDER — PANTOPRAZOLE SODIUM 40 MG IV SOLR
40.0000 mg | Freq: Every day | INTRAVENOUS | Status: DC
  Administered 2014-01-09 – 2014-01-11 (×3): 40 mg via INTRAVENOUS
  Filled 2014-01-09 (×3): qty 40

## 2014-01-09 MED ORDER — BIOTENE DRY MOUTH MT LIQD: 15.0000 mL | Freq: Four times a day (QID) | OROMUCOSAL | Status: DC

## 2014-01-09 MED ORDER — IPRATROPIUM BROMIDE 0.02 % IN SOLN
0.5000 mg | Freq: Once | RESPIRATORY_TRACT | Status: AC
  Administered 2014-01-09: 0.5 mg via RESPIRATORY_TRACT
  Filled 2014-01-09: qty 2.5

## 2014-01-09 MED ORDER — ALBUTEROL SULFATE (2.5 MG/3ML) 0.083% IN NEBU
5.0000 mg | INHALATION_SOLUTION | Freq: Once | RESPIRATORY_TRACT | Status: AC
  Administered 2014-01-09: 5 mg via RESPIRATORY_TRACT
  Filled 2014-01-09: qty 6

## 2014-01-09 MED ORDER — DEXTROSE 5 % IV SOLN
INTRAVENOUS | Status: DC
  Administered 2014-01-10: 10:00:00 via INTRAVENOUS

## 2014-01-09 MED ORDER — MIDAZOLAM HCL 2 MG/2ML IJ SOLN
INTRAMUSCULAR | Status: AC
  Administered 2014-01-09: 2 mg
  Filled 2014-01-09: qty 2

## 2014-01-09 MED ORDER — ROCURONIUM BROMIDE 50 MG/5ML IV SOLN: 40.0000 mg/kg | Freq: Once | INTRAVENOUS | Status: DC

## 2014-01-09 MED ORDER — SODIUM CHLORIDE 0.9 % IV BOLUS (SEPSIS): 500.0000 mL | Freq: Once | INTRAVENOUS | Status: DC

## 2014-01-09 MED ORDER — CHLORHEXIDINE GLUCONATE 0.12 % MT SOLN
15.0000 mL | Freq: Two times a day (BID) | OROMUCOSAL | Status: DC
  Administered 2014-01-10 – 2014-01-16 (×14): 15 mL via OROMUCOSAL
  Filled 2014-01-09 (×14): qty 15

## 2014-01-09 MED ORDER — FENTANYL CITRATE 0.05 MG/ML IJ SOLN
INTRAMUSCULAR | Status: AC
  Filled 2014-01-09: qty 2

## 2014-01-09 MED ORDER — ETOMIDATE 2 MG/ML IV SOLN: 20.0000 mg | Freq: Once | INTRAVENOUS | Status: DC

## 2014-01-09 MED ORDER — FENTANYL CITRATE 0.05 MG/ML IJ SOLN
50.0000 ug | Freq: Once | INTRAMUSCULAR | Status: AC
  Administered 2014-01-09: 50 ug via INTRAVENOUS

## 2014-01-09 NOTE — Progress Notes (Signed)
PULMONARY / CRITICAL CARE MEDICINE   Name: Whitney Marks MRN: [id number] DOB: [birth year]    ADMISSION DATE:  01/09/2014  REFERRING MD :  EDP PRIMARY SERVICE: PCCM  CHIEF COMPLAINT:  Dyspnea  BRIEF PATIENT DESCRIPTION: 53 yo with history of tracheostomy, tracheal stenosis previous admissions for aspiration pneumonia, RADS, sepsis and opioid addiction presented 3/12 with dyspnea, worsening cough and fever.  SIGNIFICANT EVENTS / STUDIES:   LINES / TUBES:  CULTURES: 3/12  Blood >>> 3/12  Urine >> 3/12  Sputum >>> 3/12 Bronchial washings >>   ANTIBIOTICS: Vancomyicn 3/12 >>> Zosyn 3/12 >>> Levaquin 3/12 >>>  Subjective Increased WOB  VITAL SIGNS: Temp:  [98.8 F (37.1 C)] 98.8 F (37.1 C) (03/12 1242) Pulse Rate:  [80-110] 108 (03/12 2030) Resp:  [15-31] 26 (03/12 2030) BP: (73-105)/(41-75) 105/42 mmHg (03/12 2025) SpO2:  [62 %-100 %] 99 % (03/12 2030)  HEMODYNAMICS:   VENTILATOR SETTINGS:   INTAKE / OUTPUT: Intake/Output   None     PHYSICAL EXAMINATION: General:  Acutely ill appearing, increased WOB, accessory muscle use Neuro:  Awake, alert but providing limited history, not protecting airway HEENT:  No JVD, midline tracheostomy scar, gargling respirations, weak cough Cardiovascular:  Regular, tachycardic Lungs:  Bilateral rhonchi/ rales, worsening accessory muscle use.  Abdomen:  Soft, non tender Musculoskeletal:  Moves all extremities Skin:  [place]  LABS:  CBC  Recent Labs Lab 01/09/14 1306  WBC 8.3  HGB 11.2*  HCT 33.7*  PLT 229   Coag's No results found for this basename: APTT, INR,  in the last 168 hours BMET  Recent Labs Lab 01/09/14 1306 01/09/14 1701  NA 138 139  K 4.0 4.2  CL 102 104  CO2 17* 16*  BUN 50* 47*  CREATININE 3.41* 3.36*  GLUCOSE 109* 126*   Electrolytes  Recent Labs Lab 01/09/14 1306 01/09/14 1701  CALCIUM 8.3* 7.8*  MG  --  2.2  PHOS  --  6.2*   Sepsis Markers No results found for this basename:  LATICACIDVEN, PROCALCITON, O2SATVEN,  in the last 168 hours ABG  Recent Labs Lab 01/09/14 1750 01/09/14 2050  PHART 7.197* 7.189*  PCO2ART 46.6* 52.4*  PO2ART 72.0* 330.0*   Liver Enzymes  Recent Labs Lab 01/09/14 1701  AST 31  ALT 6  ALKPHOS 83  BILITOT 0.3  ALBUMIN 3.0*   Cardiac Enzymes  Recent Labs Lab 01/09/14 1705  TROPONINI <0.30  PROBNP 4590.0*   Glucose No results found for this basename: GLUCAP,  in the last 168 hours  Imaging Dg Chest Port 1 View  01/09/2014   CLINICAL DATA:  Weakness in legs, cough and congestion  EXAM: PORTABLE CHEST - 1 VIEW  COMPARISON:  DG CHEST 1V PORT dated 11/01/2011; DG CHEST 1V PORT dated 10/31/2011; DG CHEST 1V PORT dated 10/30/2011  FINDINGS: Mild cardiac enlargement stable. Mild vascular congestion new from prior study. Mild interstitial change, more prominent when compared to the prior study. Lines and tubes has been removed.  IMPRESSION: Findings suggest mild interstitial pulmonary edema. Atypical bilateral pneumonia is possible but considered less likely.   Electronically Signed   By: Skipper Cliche M.D.   On: 01/09/2014 14:56   ASSESSMENT / PLAN:  PULMONARY A: Acute respiratory failure  B infiltrates, Aspiration pneumonia / pneumonitis, inhaled exposure (UDS pending) History of tracheostomy, ? Tracheal stenosis Concerned that this is going to progress to ARDS. resp status worse.  P:   Intubate ARDS protocol PAD protocol  Ck sputum / BAL  CARDIOVASCULAR A: SIRS / sepsis P:  Place CVL May need vasopressors Trend troponin / lactate  RENAL A: Acute renal failure Severe dehydration Metabolic acidosis P:   Trend BMP Avoid hypotension Transduce CVP   GASTROINTESTINAL A: Suspect dysphagia and chronic aspiration GI Px is not required P:   NPO Place G tube Swallow evaluation when able  HEMATOLOGIC A:   Mild anemia VTE Px P:  Trend CBC Heparin  INFECTIOUS A: Aspiration pneumonia P:   Cx / abx as  above Send sputum  Cycle PCT  ENDOCRINE A: No active issues P:   No intervention required  NEUROLOGIC A: Acute encephalopathy Suspect opioid addiction / dependence  P:   Drug screen pending Tylenol / ASA level PAD protocol: goal RASS -2   Have had long discussion w/ pt and family. Our window is closing in re: how much reserve she has to tolerate intubation. Given her increased work of breathing and likelihood for getting worse we will go ahead and intubate.   CRITICAL CARE:  The patient is critically ill with multiple organ systems failure and requires high complexity decision making for assessment and support, frequent evaluation and titration of therapies, application of advanced monitoring technologies and extensive interpretation of multiple databases. Critical Care Time devoted to patient care services described in this note is 60 minutes.   Marni Griffon, MD Pulmonary and [place] Pager: [phone] 01/09/2014, 9:09 PM  Baltazar Apo, MD, PhD 01/09/2014, 9:30 PM [place] [facility] [phone] or if no answer [phone]

## 2014-01-09 NOTE — ED Notes (Signed)
Dr. Earnest Conroy in to assess pt at this time.

## 2014-01-09 NOTE — ED Provider Notes (Signed)
I saw and evaluated the patient, reviewed the resident's note and I agree with the findings and plan.   EKG Interpretation   Date/Time:  Thursday January 09 2014 12:39:44 EDT Ventricular Rate:  96 PR Interval:  119 QRS Duration: 89 QT Interval:  352 QTC Calculation: 445 R Axis:   76 Text Interpretation:  Sinus rhythm Borderline short PR interval No  significant change since last tracing Confirmed by Morse Brueggemann,  DO, Haruko Mersch  [id number]) on 01/09/2014 12:51:09 PM      Pt is a 53 y.o. 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 Here she is extremely hypoxic and hypotensive. Chest x-ray shows bilateral pulmonary edema with atypical pneumonia is also possible. She has no leukocytosis. We'll give broad-spectrum antibiotic. Patient will need IV fluids due to her hypotension. She also has elevated BUN and creatinine. Patient will need admission.  [place], DO 01/09/14 [phone]

## 2014-01-09 NOTE — ED Provider Notes (Signed)
CSN: [id number]     Arrival date & time 01/09/14  1238 History   First MD Initiated Contact with Patient 01/09/14 1239     Chief Complaint  Patient presents with  . Shortness of Breath     (Consider location/radiation/quality/duration/timing/severity/associated sxs/prior Treatment) HPI Comments: Patient with h/o aspiration PNA, tracheal stenosis 2/2 previous tracheostomy -- presents with c/o worsening cough for several weeks but worsening shortness of breath for 2 days. She has felt extremely weak as well, stating she cannot walk because her legs give out on here. She had subjective fever yesterday. No URI sx, CP, N/V/D, urinary symptoms. Cough productive of yellow sputum. No treatments PTA. Transported by EMS, received albuterol en route. Found to be hypoxic upon arrival to ED. No home O2. Patient denies risk factors for pulmonary embolism including: unilateral leg swelling, history of DVT/PE/other blood clots, use of estrogens, recent immobilizations, recent surgery, recent travel (>4hr segment), malignancy, hemoptysis. The onset of this condition was acute. The course is worsening. Aggravating factors: none. Alleviating factors: none.    Patient is a 53 y.o. female presenting with shortness of breath. The history is provided by the patient.  Shortness of Breath Associated symptoms: cough and fever   Associated symptoms: no abdominal pain, no chest pain, no headaches, no rash, no sore throat and no vomiting     Past Medical History  Diagnosis Date  . Pneumonia   . Chronic pain   . Arthritis   . Difficult intubation   . Shortness of breath   . Sleep apnea   . Cancer of cervix   . Recurrent upper respiratory infection (URI)   . Headache(784.0)   . Anxiety   . Neuromuscular disorder   . Fibromyalgia   . Depression    Past Surgical History  Procedure Laterality Date  . Neck surgery     No family history on file. History  Substance Use Topics  . Smoking status: Never Smoker    . Smokeless tobacco: Not on file  . Alcohol Use: Yes   OB History   Grav Para Term Preterm Abortions TAB SAB Ect Mult Living                 Review of Systems  Constitutional: Positive for fever and fatigue.  HENT: Negative for rhinorrhea and sore throat.   Eyes: Negative for redness.  Respiratory: Positive for cough and shortness of breath.   Cardiovascular: Negative for chest pain.  Gastrointestinal: Negative for nausea, vomiting, abdominal pain and diarrhea.  Genitourinary: Negative for dysuria.  Musculoskeletal: Negative for myalgias.  Skin: Negative for rash.  Neurological: Negative for headaches.    Allergies  Codeine; Pentazocine lactate; and Ranitidine hcl  Home Medications   Current Outpatient Rx  Name  Route  Sig  Dispense  Refill  . diazepam (VALIUM) 5 MG tablet   Oral   Take 5 mg by mouth every 8 (eight) hours as needed. anxiety          . food thickener (RESOURCE THICKENUP CLEAR) POWD   Oral   Take 1 scoop by mouth as needed (for honey thick liquids).   1 Can   1   . Multiple Vitamin (MULITIVITAMIN WITH MINERALS) TABS   Oral   Take 1 tablet by mouth daily.           Marland Kitchen oxyCODONE-acetaminophen (PERCOCET) 10-325 MG per tablet   Oral   Take 1 tablet by mouth 3 (three) times daily.           Marland Kitchen  oxymorphone (OPANA ER) 20 MG 12 hr tablet   Oral   Take 20 mg by mouth every 12 (twelve) hours.           . pravastatin (PRAVACHOL) 20 MG tablet   Oral   Take 20 mg by mouth daily.           . pregabalin (LYRICA) 150 MG capsule   Oral   Take 150 mg by mouth 2 (two) times daily.           . traZODone (DESYREL) 50 MG tablet   Oral   Take 100 mg by mouth at bedtime.            BP 84/49  Temp(Src) 98.8 F (37.1 C) (Oral)  Resp 27  SpO2 88%  Physical Exam  Nursing note and vitals reviewed. Constitutional: She appears well-developed and well-nourished.  HENT:  Head: Normocephalic and atraumatic.  Mouth/Throat: Oropharynx is clear and  moist.  Eyes: Conjunctivae are normal. Right eye exhibits no discharge. Left eye exhibits no discharge.  Neck: Normal range of motion. Neck supple.  Previous tracheostomy scar.   Cardiovascular: Normal rate, regular rhythm and normal heart sounds.   Pulmonary/Chest: Effort normal. No respiratory distress. She has wheezes. She has no rales.  O2 sat to 83% when taken off O2 for 2 minutes.   Abdominal: Soft. There is no tenderness. There is no rebound and no guarding.  Musculoskeletal: She exhibits no edema and no tenderness.  No LE edema.  Neurological: She is alert.  Skin: Skin is warm and dry.  Psychiatric: She has a normal mood and affect.    ED Course  Procedures (including critical care time) Labs Review Labs Reviewed  CBC WITH DIFFERENTIAL - Abnormal; Notable for the following:    RBC 3.72 (*)    Hemoglobin 11.2 (*)    HCT 33.7 (*)    Neutrophils Relative % 86 (*)    Lymphocytes Relative 11 (*)    Monocytes Relative 2 (*)    All other components within normal limits  BASIC METABOLIC PANEL - Abnormal; Notable for the following:    CO2 17 (*)    Glucose, Bld 109 (*)    BUN 50 (*)    Creatinine, Ser 3.41 (*)    Calcium 8.3 (*)    GFR calc non Af Amer 14 (*)    GFR calc Af Amer 17 (*)    All other components within normal limits  I-STAT TROPOININ, ED - Abnormal; Notable for the following:    Troponin i, poc 0.10 (*)    All other components within normal limits  CULTURE, BLOOD (ROUTINE X 2)  CULTURE, BLOOD (ROUTINE X 2)  TROPONIN I  URINALYSIS, ROUTINE W REFLEX MICROSCOPIC  PRO B NATRIURETIC PEPTIDE  I-STAT CG4 LACTIC ACID, ED   Imaging Review Dg Chest Port 1 View  01/09/2014   CLINICAL DATA:  Weakness in legs, cough and congestion  EXAM: PORTABLE CHEST - 1 VIEW  COMPARISON:  DG CHEST 1V PORT dated 11/01/2011; DG CHEST 1V PORT dated 10/31/2011; DG CHEST 1V PORT dated 10/30/2011  FINDINGS: Mild cardiac enlargement stable. Mild vascular congestion new from prior study. Mild  interstitial change, more prominent when compared to the prior study. Lines and tubes has been removed.  IMPRESSION: Findings suggest mild interstitial pulmonary edema. Atypical bilateral pneumonia is possible but considered less likely.   Electronically Signed   By: Skipper Cliche M.D.   On: 01/09/2014 14:56     EKG Interpretation  Date/Time:  Thursday January 09 2014 12:39:44 EDT Ventricular Rate:  96 PR Interval:  119 QRS Duration: 89 QT Interval:  352 QTC Calculation: 445 R Axis:   76 Text Interpretation:  Sinus rhythm Borderline short PR interval No  significant change since last tracing Confirmed by WARD,  DO, KRISTEN  [id number]) on 01/09/2014 12:51:09 PM      Patient seen and examined. Work-up initiated. Medications ordered. Patient d/w and seen by Dr. Leonides Schanz.   Vital signs reviewed and are as follows: Filed Vitals:   01/09/14 1500  BP: 93/48  Pulse: 94  Temp:   Resp: 15   3:37 PM CXR reviewed. AKI noted. IV abx ordered. Clinical picture is more consistent with dehydration and PNA, than CHF or fluid overload.   Will need admission. PCP in [place], [place].   BP continues to be soft, will continue slow fluids. Patient mentating well.   3:59 PM Spoke with Dr. Conley Canal who will see. Lactate is pending.   4:26 PM [facility] has seen patient and feels that patient is most suited for ICU. I spoke with Dr. Lake Bells who states PCCM will see patient.   MDM   Final diagnoses:  Shortness of breath  Hypoxia  AKI (acute kidney injury)       Carlisle Cater, PA-C 01/09/14 1627

## 2014-01-09 NOTE — Procedures (Signed)
Intubation Procedure Note Whitney Marks [id number] [birth year]  Procedure: Intubation Indications: Respiratory insufficiency  Procedure Details Consent: Risks of procedure as well as the alternatives and risks of each were explained to the (patient/caregiver).  Consent for procedure obtained. Time Out: Verified patient identification, verified procedure, site/side was marked, verified correct patient position, special equipment/implants available, medications/allergies/relevent history reviewed, required imaging and test results available.  Performed  Maximum sterile technique was used including antiseptics, cap, gloves, gown, hand hygiene and mask.  MAC 3 glide scope    Evaluation Hemodynamic Status: BP stable throughout; O2 sats: stable throughout Patient's Current Condition: stable Complications: No apparent complications Patient did tolerate procedure well. Chest X-ray ordered to verify placement.  CXR: pending.   BABCOCK,PETE 01/09/2014  Baltazar Apo, MD, PhD 01/09/2014, 9:55 PM [place] [facility] [phone] or if no answer [phone]

## 2014-01-09 NOTE — ED Notes (Signed)
Troponin results given to Dr. Stark Jock

## 2014-01-09 NOTE — ED Notes (Signed)
Phlebotomy called to collect blood cultures. 

## 2014-01-09 NOTE — Procedures (Signed)
Arterial Catheter Insertion Procedure Note Whitney Marks [id number] 05-06-1961  Procedure: Insertion of Arterial Catheter  Indications: Blood pressure monitoring and Frequent blood sampling  Procedure Details Consent: Unable to obtain consent because of altered level of consciousness. Time Out: Verified patient identification, verified procedure, site/side was marked, verified correct patient position, special equipment/implants available, medications/allergies/relevent history reviewed, required imaging and test results available.  Performed  Maximum sterile technique was used including antiseptics, cap, gloves, gown, hand hygiene, mask and sheet. Skin prep: Chlorhexidine; local anesthetic administered 20 gauge catheter was inserted into left radial artery using the Seldinger technique.  Evaluation Blood flow good; BP tracing good. Complications: No apparent complications.   Whitney Marks 01/09/2014

## 2014-01-09 NOTE — ED Notes (Addendum)
resp tx started and fluids started port done per dr ward

## 2014-01-09 NOTE — ED Notes (Addendum)
Sob cough fever and weakness/ stumbling x 3 days pt has had trach surgery she states has normal raspy voice pt given .5 of albuteral HHN and placed on 4 l [place] breathing better now and pulse ox up per [place] ems no iv

## 2014-01-09 NOTE — ED Notes (Signed)
Labs being drawn at this time.  Pt made aware that she is NPO per Dr. Earnest Conroy.

## 2014-01-09 NOTE — ED Notes (Signed)
iV team here to start IV pt states that she has to go every 3 years to have trachea and esophagus stretched and that it is time for her to have this done she know she needs this when she gets sob at timed

## 2014-01-09 NOTE — Progress Notes (Signed)
[facility] Progress Note Patient Name: Whitney Marks DOB: June 01, 1961 MRN: [id number]  Date of Service  01/09/2014   HPI/Events of Note     eICU Interventions  OG tube   Intervention Category Minor Interventions: Routine modifications to care plan (e.g. PRN medications for pain, fever)  Chike Farrington 01/09/2014, 10:18 PM

## 2014-01-09 NOTE — ED Notes (Signed)
Pt's daughter requesting pt get something to eat.  Carlisle Cater PA made aware.  [facility] pt may eat at this time.

## 2014-01-09 NOTE — ED Notes (Signed)
Dr ward into see pt

## 2014-01-09 NOTE — Procedures (Signed)
Central Venous Catheter Insertion Procedure Note Whitney Marks [id number] 17-Nov-1960  Procedure: Insertion of Central Venous Catheter Indications: Assessment of intravascular volume, Drug and/or fluid administration and Frequent blood sampling  Procedure Details Consent: Risks of procedure as well as the alternatives and risks of each were explained to the (patient/caregiver).  Consent for procedure obtained. Time Out: Verified patient identification, verified procedure, site/side was marked, verified correct patient position, special equipment/implants available, medications/allergies/relevent history reviewed, required imaging and test results available.  Performed  Maximum sterile technique was used including antiseptics, cap, gloves, gown, hand hygiene, mask and sheet. Skin prep: Chlorhexidine; local anesthetic administered A antimicrobial bonded/coated triple lumen catheter was placed in the left internal jugular vein using the Seldinger technique.  Evaluation Blood flow good Complications: No apparent complications Patient did tolerate procedure well. Chest X-ray ordered to verify placement.  CXR: pending.  BABCOCK,PETE 01/09/2014, 9:50 PM  Baltazar Apo, MD, PhD 01/09/2014, 9:55 PM [facility] [phone] or if no answer [phone]

## 2014-01-09 NOTE — H&P (Signed)
PULMONARY / CRITICAL CARE MEDICINE   Name: Whitney Marks MRN: [id number] DOB: [birth year]    ADMISSION DATE:  01/09/2014  REFERRING MD :  EDP PRIMARY SERVICE: PCCM  CHIEF COMPLAINT:  Dyspnea  BRIEF PATIENT DESCRIPTION: 53 yo with history of tracheostomy, tracheal stenosis previous admissions for aspiration pneumonia, RADS, sepsis and opioid addiction presented 3/12 with dyspnea, worsening cough and fever.  SIGNIFICANT EVENTS / STUDIES:   LINES / TUBES:  CULTURES: 3/12  Blood >>> 3/12  Urine >> 3/12  Sputum >>>  ANTIBIOTICS: Vancomyicn 3/12 >>> Zosyn 3/12 >>> Levaquin 3/12 >>>  The patient is in respiratory distress and unable to provide history, which was obtained for available medical records.  HISTORY OF PRESENT ILLNESS:  53 yo with history of tracheostomy, tracheal stenosis previous admissions for aspiration pneumonia, RADS, sepsis and opioid addiction presented 3/12 with dyspnea, worsening cough and fever.  PAST MEDICAL HISTORY :  Past Medical History  Diagnosis Date  . Pneumonia   . Chronic pain   . Arthritis   . Difficult intubation   . Shortness of breath   . Sleep apnea   . Cancer of cervix   . Recurrent upper respiratory infection (URI)   . Headache(784.0)   . Anxiety   . Neuromuscular disorder   . Fibromyalgia   . Depression    Past Surgical History  Procedure Laterality Date  . Neck surgery     Prior to Admission medications   Medication Sig Start Date End Date Taking? Authorizing Provider  diazepam (VALIUM) 5 MG tablet Take 5 mg by mouth every 8 (eight) hours as needed. anxiety    Yes Historical Provider, MD  oxyCODONE-acetaminophen (PERCOCET) 10-325 MG per tablet Take 1 tablet by mouth 3 (three) times daily.     Yes Historical Provider, MD  oxymorphone (OPANA ER) 20 MG 12 hr tablet Take 10 mg by mouth every 12 (twelve) hours.    Yes Historical Provider, MD  pregabalin (LYRICA) 150 MG capsule Take 150 mg by mouth 2 (two) times daily.     Yes  Historical Provider, MD  TRAVATAN Z 0.004 % SOLN ophthalmic solution Place 1 drop into both eyes 2 (two) times daily.  01/07/14  Yes Historical Provider, MD  traZODone (DESYREL) 50 MG tablet Take 100 mg by mouth at bedtime.     Yes Historical Provider, MD  VOLTAREN 1 % GEL Apply 2 g topically 3 (three) times daily.  12/12/13  Yes Historical Provider, MD  food thickener (RESOURCE THICKENUP CLEAR) POWD Take 1 scoop by mouth as needed (for honey thick liquids). 11/04/11   Grace Bushy Minor, NP   Allergies  Allergen Reactions  . Codeine Itching  . Pentazocine Lactate Other (See Comments)    hallucinates  . Ranitidine Hcl Other (See Comments)    blisters   FAMILY HISTORY:  No family history on file.  SOCIAL HISTORY:  reports that she has never smoked. She does not have any smokeless tobacco history on file. She reports that she drinks alcohol. She reports that she does not use illicit drugs.  REVIEW OF SYSTEMS:  Unable to provide  VITAL SIGNS: Temp:  [98.8 F (37.1 C)] 98.8 F (37.1 C) (03/12 1242) Pulse Rate:  [80-94] 93 (03/12 1645) Resp:  [15-31] 31 (03/12 1645) BP: (73-94)/(41-66) 94/52 mmHg (03/12 1645) SpO2:  [62 %-100 %] 89 % (03/12 1645)  HEMODYNAMICS:   VENTILATOR SETTINGS:   INTAKE / OUTPUT: Intake/Output   None     PHYSICAL EXAMINATION: General:  Acutely ill appearing, increased WOB, accessory muscle use Neuro:  Awake, alert but providing limited history, not protecting airway HEENT:  No JVD, midline tracheostomy scar, gargling respirations, weak cough Cardiovascular:  Regular, tachycardic Lungs:  Bilateral rhonchi Abdomen:  Soft, non tender Musculoskeletal:  Moves all extremities Skin:  [place]  LABS:  CBC  Recent Labs Lab 01/09/14 1306  WBC 8.3  HGB 11.2*  HCT 33.7*  PLT 229   Coag's No results found for this basename: APTT, INR,  in the last 168 hours BMET  Recent Labs Lab 01/09/14 1306  NA 138  K 4.0  CL 102  CO2 17*  BUN 50*  CREATININE  3.41*  GLUCOSE 109*   Electrolytes  Recent Labs Lab 01/09/14 1306  CALCIUM 8.3*   Sepsis Markers No results found for this basename: LATICACIDVEN, PROCALCITON, O2SATVEN,  in the last 168 hours ABG No results found for this basename: PHART, PCO2ART, PO2ART,  in the last 168 hours Liver Enzymes No results found for this basename: AST, ALT, ALKPHOS, BILITOT, ALBUMIN,  in the last 168 hours Cardiac Enzymes No results found for this basename: TROPONINI, PROBNP,  in the last 168 hours Glucose No results found for this basename: GLUCAP,  in the last 168 hours  Imaging Dg Chest Port 1 View  01/09/2014   CLINICAL DATA:  Weakness in legs, cough and congestion  EXAM: PORTABLE CHEST - 1 VIEW  COMPARISON:  DG CHEST 1V PORT dated 11/01/2011; DG CHEST 1V PORT dated 10/31/2011; DG CHEST 1V PORT dated 10/30/2011  FINDINGS: Mild cardiac enlargement stable. Mild vascular congestion new from prior study. Mild interstitial change, more prominent when compared to the prior study. Lines and tubes has been removed.  IMPRESSION: Findings suggest mild interstitial pulmonary edema. Atypical bilateral pneumonia is possible but considered less likely.   Electronically Signed   By: Skipper Cliche M.D.   On: 01/09/2014 14:56   ASSESSMENT / PLAN:  PULMONARY A: Acute respiratory failure  Aspiration pneumonia History of tracheostomy P:   ABG now Needs intubation - continues to refuse  BiPAP contraindicated Supplemental oxygen PRN  CARDIOVASCULAR A: SIRS / sepsis P:  Refusing CVL May need vasopressors Trend troponin / lactate  RENAL A: Acute renal failure Severe dehydration Metabolic acidosis P:   Trend BMP Received NS 500 x 1 Give additional NS 1000 x 1   GASTROINTESTINAL A: Suspect dysphagia and chronic aspiration GI Px is not required P:   NPO Swallow evaluation when able  HEMATOLOGIC A:   Mild anemia VTE Px P:  Trend CBC Heparin  INFECTIOUS A: Aspiration pneumonia P:   Cx  / abx as above  ENDOCRINE A: No active issues P:   No intervention required  NEUROLOGIC A: Acute encephalopathy Suspect opioid addiction / dependence  P:   Drug screen  Tylenol / ASA level Hold Valium, Percocet, Opana, Lyrica, Trazodone  I have personally obtained history, examined patient, evaluated and interpreted laboratory and imaging results, reviewed medical records, formulated assessment / plan and placed orders.  CRITICAL CARE:  The patient is critically ill with multiple organ systems failure and requires high complexity decision making for assessment and support, frequent evaluation and titration of therapies, application of advanced monitoring technologies and extensive interpretation of multiple databases. Critical Care Time devoted to patient care services described in this note is 50 minutes.   Doree Fudge, MD Pulmonary and [place] Pager: [phone]  01/09/2014, 5:49 PM

## 2014-01-09 NOTE — Procedures (Signed)
Bronchoscopy Procedure Note DAVEDA LAROCK [id number] May 01, 1961  Procedure: Bronchoscopy Indications: Diagnostic evaluation of the airways and Obtain specimens for culture and/or other diagnostic studies  Procedure Details Consent: Risks of procedure as well as the alternatives and risks of each were explained to the (patient/caregiver).  Consent for procedure obtained. Time Out: Verified patient identification, verified procedure, site/side was marked, verified correct patient position, special equipment/implants available, medications/allergies/relevent history reviewed, required imaging and test results available.  Performed  In preparation for procedure, patient was given 100% FiO2 and bronchoscope lubricated. Sedation: Benzodiazepines, Muscle relaxants and Etomidate  Airway entered and the following bronchi were examined: RUL, RML, RLL, LUL and LLL.   Procedures performed: Brushings performed Patient placed back on 100% FiO2 at conclusion of procedure.    Evaluation Hemodynamic Status: BP stable throughout; O2 sats: stable throughout Patient's Current Condition: stable Specimens:  Bronchial washings Complications: No apparent complications Patient did tolerate procedure well.   Baltazar Apo, MD, PhD 01/09/2014, 9:34 PM [facility] [phone] or if no answer [phone]

## 2014-01-09 NOTE — ED Notes (Signed)
attempted IV x 2 w/o success  IV team called to start

## 2014-01-09 NOTE — Progress Notes (Signed)
eLink Physician-Brief Progress Note Patient Name: Whitney Marks DOB: [birth year] MRN: [id number]  Date of Service  01/09/2014   HPI/Events of Note   resp acidosis post intubaation paO2 OK  eICU Interventions  Decrease FiO2/PEEP per ARDS protocol Maintain RR for now Prioritize oxygenation as possible ARDS RASS goal -2 Add prn versed Repeat ABG 2-3 hours after well sedated   Intervention Category Major Interventions: Respiratory failure - evaluation and management  Wilfrid Hyser 01/09/2014, 10:37 PM

## 2014-01-10 ENCOUNTER — Inpatient Hospital Stay (HOSPITAL_COMMUNITY): Payer: Medicare Other

## 2014-01-10 DIAGNOSIS — N179 Acute kidney failure, unspecified: Secondary | ICD-10-CM

## 2014-01-10 DIAGNOSIS — A419 Sepsis, unspecified organism: Secondary | ICD-10-CM

## 2014-01-10 DIAGNOSIS — R6520 Severe sepsis without septic shock: Secondary | ICD-10-CM

## 2014-01-10 DIAGNOSIS — G9340 Encephalopathy, unspecified: Secondary | ICD-10-CM

## 2014-01-10 LAB — POCT I-STAT 3, ART BLOOD GAS (G3+)
ACID-BASE DEFICIT: 7 mmol/L — AB (ref 0.0–2.0)
ACID-BASE DEFICIT: 9 mmol/L — AB (ref 0.0–2.0)
Acid-base deficit: 7 mmol/L — ABNORMAL HIGH (ref 0.0–2.0)
Bicarbonate: 20.2 mEq/L (ref 20.0–24.0)
Bicarbonate: 19.3 mEq/L — ABNORMAL LOW (ref 20.0–24.0)
Bicarbonate: 19.4 mEq/L — ABNORMAL LOW (ref 20.0–24.0)
O2 Saturation: 97 %
O2 Saturation: 96 %
O2 Saturation: 99 %
PH ART: 7.256 — AB (ref 7.350–7.450)
PO2 ART: 108 mmHg — AB (ref 80.0–100.0)
Patient temperature: 99.8
TCO2: 22 mmol/L (ref 0–100)
TCO2: 21 mmol/L (ref 0–100)
TCO2: 21 mmol/L (ref 0–100)
pCO2 arterial: 51.4 mmHg — ABNORMAL HIGH (ref 35.0–45.0)
pCO2 arterial: 52 mmHg — ABNORMAL HIGH (ref 35.0–45.0)
pCO2 arterial: 43.3 mmHg (ref 35.0–45.0)
pH, Arterial: 7.207 — ABNORMAL LOW (ref 7.350–7.450)
pH, Arterial: 7.183 — CL (ref 7.350–7.450)
pO2, Arterial: 118 mmHg — ABNORMAL HIGH (ref 80.0–100.0)
pO2, Arterial: 133 mmHg — ABNORMAL HIGH (ref 80.0–100.0)

## 2014-01-10 LAB — CBC
HEMATOCRIT: 31.1 % — AB (ref 36.0–46.0)
HEMOGLOBIN: 10 g/dL — AB (ref 12.0–15.0)
MCH: 29.7 pg (ref 26.0–34.0)
MCHC: 32.2 g/dL (ref 30.0–36.0)
MCV: 92.3 fL (ref 78.0–100.0)
Platelets: 241 10*3/uL (ref 150–400)
RBC: 3.37 MIL/uL — ABNORMAL LOW (ref 3.87–5.11)
RDW: 15.5 % (ref 11.5–15.5)
WBC: 11.1 10*3/uL — AB (ref 4.0–10.5)

## 2014-01-10 LAB — URINE DRUGS OF ABUSE SCREEN W ALC, ROUTINE (REF LAB)
Amphetamine Screen, Ur: NEGATIVE
Barbiturate Quant, Ur: NEGATIVE
Benzodiazepines.: POSITIVE — AB
COCAINE METABOLITES: NEGATIVE
Creatinine,U: 104.5 mg/dL
Ethyl Alcohol: <10 mg/dL (ref <10)
METHADONE: NEGATIVE
Marijuana Metabolite: NEGATIVE
Opiate Screen, Urine: POSITIVE — AB
PHENCYCLIDINE (PCP): NEGATIVE
Propoxyphene: NEGATIVE

## 2014-01-10 LAB — BLOOD GAS, ARTERIAL
Acid-base deficit: 7.6 mmol/L — ABNORMAL HIGH (ref 0.0–2.0)
Bicarbonate: 19 mEq/L — ABNORMAL LOW (ref 20.0–24.0)
DRAWN BY: 39866
FIO2: 0.6 %
LHR: 30 {breaths}/min
O2 SAT: 99.6 %
PATIENT TEMPERATURE: 100.8
PEEP/CPAP: 5 cmH2O
PH ART: 7.185 — AB (ref 7.350–7.450)
TCO2: 20.6 mmol/L (ref 0–100)
VT: 320 mL
pCO2 arterial: 53.4 mmHg — ABNORMAL HIGH (ref 35.0–45.0)
pO2, Arterial: 210 mmHg — ABNORMAL HIGH (ref 80.0–100.0)

## 2014-01-10 LAB — PROCALCITONIN
Procalcitonin: 5.54 ng/mL
Procalcitonin: 4.63 ng/mL

## 2014-01-10 LAB — GLUCOSE, CAPILLARY
GLUCOSE-CAPILLARY: 105 mg/dL — AB (ref 70–99)
GLUCOSE-CAPILLARY: 142 mg/dL — AB (ref 70–99)
GLUCOSE-CAPILLARY: 103 mg/dL — AB (ref 70–99)
GLUCOSE-CAPILLARY: 93 mg/dL (ref 70–99)
Glucose-Capillary: 116 mg/dL — ABNORMAL HIGH (ref 70–99)
Glucose-Capillary: 124 mg/dL — ABNORMAL HIGH (ref 70–99)
Glucose-Capillary: 130 mg/dL — ABNORMAL HIGH (ref 70–99)

## 2014-01-10 LAB — BASIC METABOLIC PANEL
BUN: 34 mg/dL — ABNORMAL HIGH (ref 6–23)
CHLORIDE: 110 meq/L (ref 96–112)
CO2: 19 mEq/L (ref 19–32)
CREATININE: 1.84 mg/dL — AB (ref 0.50–1.10)
Calcium: 7.6 mg/dL — ABNORMAL LOW (ref 8.4–10.5)
GFR calc non Af Amer: 30 mL/min — ABNORMAL LOW (ref >90)
GFR, EST AFRICAN AMERICAN: 35 mL/min — AB (ref >90)
GLUCOSE: 135 mg/dL — AB (ref 70–99)
POTASSIUM: 4.6 meq/L (ref 3.7–5.3)
Sodium: 142 mEq/L (ref 137–147)

## 2014-01-10 LAB — URINE CULTURE
Colony Count: NO GROWTH
Culture: NO GROWTH

## 2014-01-10 LAB — SALICYLATE LEVEL

## 2014-01-10 LAB — LACTIC ACID, PLASMA: Lactic Acid, Venous: 0.6 mmol/L (ref 0.5–2.2)

## 2014-01-10 LAB — MRSA PCR SCREENING: MRSA BY PCR: POSITIVE — AB

## 2014-01-10 LAB — ACETAMINOPHEN LEVEL

## 2014-01-10 LAB — TROPONIN I: Troponin I: <0.3 ng/mL (ref <0.30)

## 2014-01-10 MED ORDER — VANCOMYCIN HCL IN DEXTROSE 750-5 MG/150ML-% IV SOLN
750.0000 mg | Freq: Once | INTRAVENOUS | Status: AC
  Administered 2014-01-10: 750 mg via INTRAVENOUS
  Filled 2014-01-10: qty 150

## 2014-01-10 MED ORDER — MIDAZOLAM HCL 2 MG/2ML IJ SOLN
INTRAMUSCULAR | Status: AC
  Administered 2014-01-10: 2 mg
  Filled 2014-01-10: qty 2

## 2014-01-10 MED ORDER — ACETAMINOPHEN 160 MG/5ML PO SOLN
650.0000 mg | Freq: Four times a day (QID) | ORAL | Status: DC | PRN
  Administered 2014-01-10: 650 mg
  Filled 2014-01-10: qty 20.3

## 2014-01-10 MED ORDER — OXEPA PO LIQD
1000.0000 mL | ORAL | Status: DC
  Administered 2014-01-10 – 2014-01-14 (×5): 1000 mL
  Filled 2014-01-10 (×8): qty 1000

## 2014-01-10 MED ORDER — PIPERACILLIN-TAZOBACTAM 3.375 G IVPB
3.3750 g | Freq: Three times a day (TID) | INTRAVENOUS | Status: DC
  Administered 2014-01-10 – 2014-01-13 (×8): 3.375 g via INTRAVENOUS
  Filled 2014-01-10 (×10): qty 50

## 2014-01-10 MED ORDER — MIDAZOLAM HCL 2 MG/2ML IJ SOLN
1.0000 mg | INTRAMUSCULAR | Status: DC | PRN
  Administered 2014-01-10: 2 mg via INTRAVENOUS
  Administered 2014-01-10 – 2014-01-13 (×16): 4 mg via INTRAVENOUS
  Filled 2014-01-10 (×14): qty 4
  Filled 2014-01-10: qty 2
  Filled 2014-01-10 (×3): qty 4

## 2014-01-10 MED ORDER — MIDAZOLAM HCL 2 MG/2ML IJ SOLN: 104.0000 mg | INTRAMUSCULAR | Status: DC | PRN

## 2014-01-10 MED ORDER — SODIUM CHLORIDE 0.9 % IV SOLN
INTRAVENOUS | Status: DC
  Administered 2014-01-10 – 2014-01-13 (×2): via INTRAVENOUS

## 2014-01-10 MED ORDER — CHLORHEXIDINE GLUCONATE CLOTH 2 % EX PADS
6.0000 | MEDICATED_PAD | Freq: Every day | CUTANEOUS | Status: AC
  Administered 2014-01-11 – 2014-01-15 (×4): 6 via TOPICAL

## 2014-01-10 MED ORDER — SENNOSIDES-DOCUSATE SODIUM 8.6-50 MG PO TABS
2.0000 | ORAL_TABLET | Freq: Two times a day (BID) | ORAL | Status: DC
  Administered 2014-01-10 – 2014-01-12 (×6): 2 via ORAL
  Filled 2014-01-10 (×8): qty 2

## 2014-01-10 MED ORDER — VANCOMYCIN HCL IN DEXTROSE 750-5 MG/150ML-% IV SOLN: 750.0000 mg | INTRAVENOUS | Status: DC

## 2014-01-10 MED ORDER — VANCOMYCIN HCL 500 MG IV SOLR
500.0000 mg | INTRAVENOUS | Status: DC
  Administered 2014-01-11 – 2014-01-13 (×3): 500 mg via INTRAVENOUS
  Filled 2014-01-10 (×3): qty 500

## 2014-01-10 MED ORDER — NOREPINEPHRINE BITARTRATE 1 MG/ML IJ SOLN
2.0000 ug/min | INTRAMUSCULAR | Status: DC
Start: 2014-01-10 — End: 2014-01-15
  Administered 2014-01-10: 5 ug/min via INTRAVENOUS
  Filled 2014-01-10 (×2): qty 16

## 2014-01-10 MED ORDER — PRO-STAT SUGAR FREE PO LIQD
30.0000 mL | Freq: Every day | ORAL | Status: DC
  Administered 2014-01-10 – 2014-01-14 (×5): 30 mL
  Filled 2014-01-10 (×6): qty 30

## 2014-01-10 MED ORDER — LEVOFLOXACIN IN D5W 500 MG/100ML IV SOLN
500.0000 mg | INTRAVENOUS | Status: DC
  Administered 2014-01-12: 500 mg via INTRAVENOUS
  Filled 2014-01-10: qty 100

## 2014-01-10 MED ORDER — LEVOFLOXACIN IN D5W 750 MG/150ML IV SOLN
750.0000 mg | Freq: Once | INTRAVENOUS | Status: AC
  Administered 2014-01-10: 750 mg via INTRAVENOUS
  Filled 2014-01-10: qty 150

## 2014-01-10 MED ORDER — SODIUM BICARBONATE 8.4 % IV SOLN
INTRAVENOUS | Status: AC
  Filled 2014-01-10: qty 50

## 2014-01-10 MED ORDER — PIPERACILLIN-TAZOBACTAM IN DEX 2-0.25 GM/50ML IV SOLN
2.2500 g | Freq: Four times a day (QID) | INTRAVENOUS | Status: DC
  Administered 2014-01-10 (×3): 2.25 g via INTRAVENOUS
  Filled 2014-01-10 (×4): qty 50

## 2014-01-10 MED ORDER — MUPIROCIN 2 % EX OINT
1.0000 "application " | TOPICAL_OINTMENT | Freq: Two times a day (BID) | CUTANEOUS | Status: AC
  Administered 2014-01-10 – 2014-01-15 (×10): 1 via NASAL
  Filled 2014-01-10 (×2): qty 22

## 2014-01-10 MED ORDER — SODIUM BICARBONATE 8.4 % IV SOLN
100.0000 meq | Freq: Once | INTRAVENOUS | Status: AC
  Administered 2014-01-10: 100 meq via INTRAVENOUS
  Filled 2014-01-10: qty 100

## 2014-01-10 MED ORDER — VITAL AF 1.2 CAL PO LIQD: 1000.0000 mL | ORAL | Status: DC

## 2014-01-10 MED ORDER — MIDAZOLAM HCL 2 MG/2ML IJ SOLN: 2.0000 mg | Freq: Once | INTRAMUSCULAR | Status: AC

## 2014-01-10 MED ORDER — SODIUM CHLORIDE 0.9 % IV BOLUS (SEPSIS)
750.0000 mL | Freq: Once | INTRAVENOUS | Status: AC
  Administered 2014-01-10: 750 mL via INTRAVENOUS

## 2014-01-10 NOTE — Progress Notes (Signed)
PULMONARY / CRITICAL CARE MEDICINE   Name: Whitney Marks MRN: [id number] DOB: 06/17/1961    ADMISSION DATE:  01/09/2014  REFERRING MD :  EDP PRIMARY SERVICE: PCCM  CHIEF COMPLAINT:  Dyspnea  BRIEF PATIENT DESCRIPTION: 53 yo with history of tracheostomy, tracheal stenosis previous admissions for aspiration pneumonia, RADS, sepsis and opioid addiction presented 3/12 with dyspnea, worsening cough and fever.   has a past medical history of Pneumonia; Chronic pain; Arthritis; Difficult intubation; Shortness of breath; Sleep apnea; Cancer of cervix; Recurrent upper respiratory infection (URI); Headache(784.0); Anxiety; Neuromuscular disorder; Fibromyalgia; and Depression.   has past surgical history that includes Neck surgery.   SIGNIFICANT EVENTS / STUDIES:   LINES / TUBES: ETT 01/09/14 >> CVL 01/09/14 R IJ Aline   CULTURES: 3/12  Blood >>> 3/12  Urine >> 3/12  Sputum >>> 01/09/14 MRSA PCr - POSITIVE   ANTIBIOTICS: Vancomyicn 3/12 >>> Zosyn 3/12 >>> Levaquin 3/12 >>>   SUBJECTIVE/OVERNIGHT/INTERVAL HX 01/10/14: Now intubated. On perssors. Septic shock persists. Renal function improving.    VITAL SIGNS: Temp:  [98.8 F (37.1 C)-100.8 F (38.2 C)] 99.8 F (37.7 C) (03/13 0845) Pulse Rate:  [73-110] 80 (03/13 1000) Resp:  [15-31] 30 (03/13 1000) BP: (73-131)/(30-109) 99/52 mmHg (03/13 1000) SpO2:  [62 %-100 %] 99 % (03/13 1000) Arterial Line BP: (76-162)/(36-66) 124/61 mmHg (03/13 1000) FiO2 (%):  [40 %-100 %] 40 % (03/13 1000) Weight:  [54.1 kg (119 lb 4.3 oz)] 54.1 kg (119 lb 4.3 oz) (03/13 0500)  HEMODYNAMICS: CVP:  [9 mmHg-10 mmHg] 10 mmHg VENTILATOR SETTINGS: Vent Mode:  [-] PRVC FiO2 (%):  [40 %-100 %] 40 % Set Rate:  [30 bmp] 30 bmp Vt Set:  [320 mL] 320 mL PEEP:  [5 cmH20] 5 cmH20 Plateau Pressure:  [19 cmH20-24 cmH20] 19 cmH20 INTAKE / OUTPUT: Intake/Output     03/12 0701 - 03/13 0700 03/13 0701 - 03/14 0700   I.V. (mL/kg) 346.5 (6.4) 596.7 (11)   IV Piggyback 1150    Total Intake(mL/kg) 1496.5 (27.7) 596.7 (11)   Urine (mL/kg/hr) 925 250 (1)   Total Output 925 250   Net +571.5 +346.7          PHYSICAL EXAMINATION: General:  Critically ill appearing. Sedated Neuro:  RASS -4 on fent gtt  HEENT:  ET Ttube +  midline tracheostomy scar,  Cardiovascular:  Regular, Normal heart sounds Lungs:  Sync with vent. LLL crack;es + Abdomen:  Soft, non tender Musculoskeletal: grossly normal Skin:  No rash  LABS: PULMONARY  Recent Labs Lab 01/09/14 1750 01/09/14 2050 01/09/14 2220 01/10/14 0130  PHART 7.197* 7.189* 7.134* 7.185*  PCO2ART 46.6* 52.4* 61.1* 53.4*  PO2ART 72.0* 330.0* 309.0* 210.0*  HCO3 18.1* 19.9* 19.6* 19.0*  TCO2 20 22 21.5 20.6  O2SAT 90.0 100.0 99.8 99.6    CBC  Recent Labs Lab 01/09/14 1306 01/09/14 1745 01/10/14 0456  HGB 11.2* 10.3* 10.0*  HCT 33.7* 31.9* 31.1*  WBC 8.3 8.7 11.1*  PLT 229 212 241    COAGULATION  Recent Labs Lab 01/09/14 1745  INR 1.08    CARDIAC   Recent Labs Lab 01/09/14 1705 01/10/14 0456  TROPONINI <0.30 <0.30    Recent Labs Lab 01/09/14 1705  PROBNP 4590.0*     CHEMISTRY  Recent Labs Lab 01/09/14 1306 01/09/14 1701 01/10/14 0456  NA 138 139 142  K 4.0 4.2 4.6  CL 102 104 110  CO2 17* 16* 19  GLUCOSE 109* 126* 135*  BUN 50* 47* 34*  CREATININE 3.41* 3.36* 1.84*  CALCIUM 8.3* 7.8* 7.6*  MG  --  2.2  --   PHOS  --  6.2*  --    Estimated Creatinine Clearance: 28.1 ml/min (by C-G formula based on Cr of 1.84).   LIVER  Recent Labs Lab 01/09/14 1701 01/09/14 1745  AST 31  --   ALT 6  --   ALKPHOS 83  --   BILITOT 0.3  --   PROT 7.7  --   ALBUMIN 3.0*  --   INR  --  1.08     INFECTIOUS  Recent Labs Lab 01/09/14 2101 01/09/14 2315 01/10/14 0456 01/10/14 0501  LATICACIDVEN  --  0.8  --  0.6  PROCALCITON 5.54  --  4.63  --      ENDOCRINE CBG (last 3)   Recent Labs  01/10/14 0024 01/10/14 0349 01/10/14 0834  GLUCAP  116* 142* 103*         IMAGING x48h  Dg Chest Port 1 View  01/09/2014   CLINICAL DATA:  Intubation, central line placement.  EXAM: PORTABLE CHEST - 1 VIEW  COMPARISON:  01/09/2014  FINDINGS: Endotracheal tube tip approximately 3.8 cm proximal to the carina. Left IJ catheter tip projects over the proximal to mid SVC. NG tube descends below the level of the image. Interstitial prominence. No pneumothorax or pleural effusion. Cardiomediastinal contours are similar to prior, within normal range. No acute osseous finding.  IMPRESSION: Endotracheal tube tip 3.8 cm proximal to the carina.  Left IJ catheter tip projects over the proximal to mid SVC. No pneumothorax.  Interstitial prominence persists; interstitial edema versus atypical pneumonia.   Electronically Signed   By: Carlos Levering M.D.   On: 01/09/2014 23:51   Dg Chest Port 1 View  01/09/2014   CLINICAL DATA:  Weakness in legs, cough and congestion  EXAM: PORTABLE CHEST - 1 VIEW  COMPARISON:  DG CHEST 1V PORT dated 11/01/2011; DG CHEST 1V PORT dated 10/31/2011; DG CHEST 1V PORT dated 10/30/2011  FINDINGS: Mild cardiac enlargement stable. Mild vascular congestion new from prior study. Mild interstitial change, more prominent when compared to the prior study. Lines and tubes has been removed.  IMPRESSION: Findings suggest mild interstitial pulmonary edema. Atypical bilateral pneumonia is possible but considered less likely.   Electronically Signed   By: Skipper Cliche M.D.   On: 01/09/2014 14:56       ASSESSMENT / PLAN:  PULMONARY A: Acute respiratory failure s/p intubation 312!5 Aspiration pneumonia History of tracheostomy   - on 40% fio2/peep 5  P:   Full vent support CXR and aBg stat   CARDIOVASCULAR A: Septic shock  P:  Septic shock protocol  RENAL A: Acute renal failure Severe dehydration Metabolic acidosis   - improving P:   Support with septic shock protocl  GASTROINTESTINAL A: Suspect dysphagia and  chronic aspiration  P:   Tube feeds per nutrition NPO PPI  HEMATOLOGIC A:   Mild anemia VTE Px P:  Trend CBC Heparin for prophylaxis PRBC for hgb < 7gm%  INFECTIOUS A: Aspiration pneumonia with possible ALI P:   Cx / abx as above  ENDOCRINE A: No active issues P:   No intervention required  NEUROLOGIC A: Acute encephalopathy Suspect opioid addiction / dependence : U tolx positive for benzo and opiopids. ETOH negative, ASA level and tylenol negative  P:   Fentanyl gtt Hold Valium, Percocet, Opana, Lyrica, Trazodone  GLOBAL 3/13: no family at bedside. Consult social work and Clinical biochemist  for suppport  I have personally obtained history, examined patient, evaluated and interpreted laboratory and imaging results, reviewed medical records, formulated assessment / plan and placed orders.  CRITICAL CARE:  The patient is critically ill with multiple organ systems failure and requires high complexity decision making for assessment and support, frequent evaluation and titration of therapies, application of advanced monitoring technologies and extensive interpretation of multiple databases. Critical Care Time devoted to patient care services described in this note is 35 minutes.     Dr. Brand Males, M.D., [facility].C.P Pulmonary and Critical Care Medicine Staff Physician [place] [facility] Pager: [phone], If no answer or between  15:00h - 7:00h: call [phone]  01/10/2014 11:47 AM

## 2014-01-10 NOTE — Progress Notes (Signed)
Dr. Leonidas Romberg made aware positive MRSA PCR.  Desmond Dike RN

## 2014-01-10 NOTE — Progress Notes (Signed)
INITIAL NUTRITION ASSESSMENT  DOCUMENTATION CODES Per approved criteria  -Not Applicable   INTERVENTION:  Utilize 54M PEPuP Protocol: initiate TF via OGT with Oxepa at 25 ml/h with Prostat 30 ml once daily on day 1; on day 2, increase to goal rate of 40 ml/h (960 ml per day) to provide 1540 kcals, 75 gm protein, 754 ml free water daily.  NUTRITION DIAGNOSIS: Inadequate oral intake related to inability to eat as evidenced by NPO status.   Goal: Intake to meet >90% of estimated nutrition needs.  Monitor:  TF tolerance/adequacy, weight trend, labs, vent status.  Reason for Assessment: MD Consult for TF initiation and management.  53 y.o. female  Admitting Dx: Dyspnea  ASSESSMENT: Patient is a 53 yo female with history of tracheostomy, tracheal stenosis previous admissions for aspiration pneumonia, RADS, sepsis and opioid addiction presented 3/12 with dyspnea, worsening cough and fever.  S/P intubation and bronchoscopy on 3/12. Received MD Consult for TF initiation and management. Currently on ARDS protocol. Dysphagia suspected per admission H&P.  Patient is currently intubated on ventilator support.  MV: 10.1 L/min Temp (24hrs), Avg:100 F (37.8 C), Min:99.5 F (37.5 C), Max:100.8 F (38.2 C)   Height: Ht Readings from Last 1 Encounters:  01/10/14 5' 1.81" (1.57 m)    Weight: Wt Readings from Last 1 Encounters:  01/10/14 119 lb 4.3 oz (54.1 kg)    Ideal Body Weight: 50 kg  % Ideal Body Weight: 108%  Wt Readings from Last 10 Encounters:  01/10/14 119 lb 4.3 oz (54.1 kg)  11/04/11 134 lb 4.2 oz (60.9 kg)    Usual Body Weight: 134 lb (> 1 year ago)  % Usual Body Weight: 89%  BMI:  Body mass index is 21.95 kg/(m^2).  Estimated Nutritional Needs: Kcal: 1545 Protein: 75-90 gm Fluid: 1.6 L  Skin: no wounds  Diet Order: NPO  EDUCATION NEEDS: -Education not appropriate at this time   Intake/Output Summary (Last 24 hours) at 01/10/14 1319 Last data filed  at 01/10/14 1000  Gross per 24 hour  Intake 2033.14 ml  Output   1175 ml  Net 858.14 ml    Last BM: none documented   Labs:   Recent Labs Lab 01/09/14 1306 01/09/14 1701 01/10/14 0456  NA 138 139 142  K 4.0 4.2 4.6  CL 102 104 110  CO2 17* 16* 19  BUN 50* 47* 34*  CREATININE 3.41* 3.36* 1.84*  CALCIUM 8.3* 7.8* 7.6*  MG  --  2.2  --   PHOS  --  6.2*  --   GLUCOSE 109* 126* 135*    CBG (last 3)   Recent Labs  01/10/14 0349 01/10/14 0834 01/10/14 1254  GLUCAP 142* 103* 130*    Scheduled Meds: . antiseptic oral rinse  15 mL Mouth Rinse QID  . chlorhexidine  15 mL Mouth Rinse BID  . fentaNYL  50 mcg Intravenous Once  . heparin subcutaneous  5,000 Units Subcutaneous 3 times per day  . latanoprost  1 drop Both Eyes QHS  . [START ON 01/12/2014] levofloxacin (LEVAQUIN) IV  500 mg Intravenous Q48H  . pantoprazole (PROTONIX) IV  40 mg Intravenous Daily  . piperacillin-tazobactam (ZOSYN)  IV  2.25 g Intravenous 4 times per day  . senna-docusate  2 tablet Oral BID  . [START ON 01/11/2014] vancomycin  500 mg Intravenous Q24H    Continuous Infusions: . sodium chloride    . fentaNYL infusion INTRAVENOUS 200 mcg/hr (01/10/14 0825)  . norepinephrine (LEVOPHED) Adult infusion 6  mcg/min (01/10/14 1033)    Past Medical History  Diagnosis Date  . Pneumonia   . Chronic pain   . Arthritis   . Difficult intubation   . Shortness of breath   . Sleep apnea   . Cancer of cervix   . Recurrent upper respiratory infection (URI)   . Headache(784.0)   . Anxiety   . Neuromuscular disorder   . Fibromyalgia   . Depression     Past Surgical History  Procedure Laterality Date  . Neck surgery      Molli Barrows, RD, LDN, [place] Pager [phone] After Hours Pager [phone]

## 2014-01-10 NOTE — Progress Notes (Addendum)
ANTIBIOTIC CONSULT NOTE - INITIAL  Pharmacy Consult for Vancocin, Zosyn, and Levaquin Indication: rule out pneumonia and rule out sepsis  Allergies  Allergen Reactions  . Codeine Itching  . Pentazocine Lactate Other (See Comments)    hallucinates  . Ranitidine Hcl Other (See Comments)    blisters    Patient Measurements: Weight: 119 lb 4.3 oz (54.1 kg)  Vital Signs: Temp: 100.8 F (38.2 C) (03/13 0026) Temp src: Oral (03/13 0026) BP: 95/42 mmHg (03/12 2339) Pulse Rate: 75 (03/13 0045)  Labs:  Recent Labs  01/09/14 1306 01/09/14 1701 01/09/14 1745  WBC 8.3  --  8.7  HGB 11.2*  --  10.3*  PLT 229  --  212  CREATININE 3.41* 3.36*  --      Microbiology: No results found for this or any previous visit (from the past 720 hour(s)).  Medical History: Past Medical History  Diagnosis Date  . Pneumonia   . Chronic pain   . Arthritis   . Difficult intubation   . Shortness of breath   . Sleep apnea   . Cancer of cervix   . Recurrent upper respiratory infection (URI)   . Headache(784.0)   . Anxiety   . Neuromuscular disorder   . Fibromyalgia   . Depression     Medications:  Prescriptions prior to admission  Medication Sig Dispense Refill  . diazepam (VALIUM) 5 MG tablet Take 5 mg by mouth every 8 (eight) hours as needed. anxiety       . oxyCODONE-acetaminophen (PERCOCET) 10-325 MG per tablet Take 1 tablet by mouth 3 (three) times daily.        Marland Kitchen oxymorphone (OPANA ER) 20 MG 12 hr tablet Take 10 mg by mouth every 12 (twelve) hours.       . pregabalin (LYRICA) 150 MG capsule Take 150 mg by mouth 2 (two) times daily.        . TRAVATAN Z 0.004 % SOLN ophthalmic solution Place 1 drop into both eyes 2 (two) times daily.       . traZODone (DESYREL) 50 MG tablet Take 100 mg by mouth at bedtime.        . VOLTAREN 1 % GEL Apply 2 g topically 3 (three) times daily.       . food thickener (RESOURCE THICKENUP CLEAR) POWD Take 1 scoop by mouth as needed (for honey thick  liquids).  1 Can  1   Scheduled:  . antiseptic oral rinse  15 mL Mouth Rinse QID  . chlorhexidine  15 mL Mouth Rinse BID  . etomidate  20 mg Intravenous Once  . fentaNYL  50 mcg Intravenous Once  . heparin subcutaneous  5,000 Units Subcutaneous 3 times per day  . latanoprost  1 drop Both Eyes QHS  . midazolam  2 mg Intravenous Once  . pantoprazole (PROTONIX) IV  40 mg Intravenous Daily  . sodium chloride  1,000 mL Intravenous Once  . sodium chloride  750 mL Intravenous Once   Infusions:  . dextrose    . fentaNYL infusion INTRAVENOUS 100 mcg/hr (01/09/14 2205)  . norepinephrine (LEVOPHED) Adult infusion      Assessment: 53yo female c/o cough, fever, and weakness x3d, progressed to SOB w/ pulse ox improving after albuterol and 4L O2, found w/ AKI (SCr 3.4, most recent SCr ~1 38yr ago) and CXR concerning for edema vs atypical PNA, to begin IV ABX.  Goal of Therapy:  Vancomycin trough level 15-20 mcg/ml  Plan:  Will begin vancomycin  750mg  IV Q48H, Zosyn 2.25g IV Q6H, and Levaquin 750mg  IV x1 followed by 500mg  IV Q48H and monitor CBC, Cx, CrCl, levels prn.  Wynona Neat, PharmD, BCPS  01/10/2014,12:59 AM   Addendum: SCr improving. Estimated CrCl ~28.70mL/min. UOP ~0.7 cc/kg/hr.  Sputum gm stain with GPC in pairs. Culture no growth to date.  WBC up to 11.1. Tmax 99.8.  Plan: Adjust Zosyn to 3.375g IV q8h- 4hr EI. Adjust Vancomycin to 500mg  IV q24h. Continue Levaquin at current dose. Follow-up renal function, culture data, and further adjust therapy as needed.  Sloan Leiter, PharmD, BCPS Clinical Pharmacist [phone], 01/10/2014, 1:13 PM

## 2014-01-10 NOTE — Progress Notes (Signed)
eLink Physician-Brief Progress Note Patient Name: Whitney Marks DOB: October 13, 1961 MRN: [id number]  Date of Service  01/10/2014   HPI/Events of Note  Hypotension Presumed severe sepsis - no abx currently ordererd   eICU Interventions  NS 500 cc NE to maintain MAP > 60 mmHg Vanc/Zosyn/levoflox ordered as indicated in Admission note   Intervention Category Major Interventions: Sepsis - evaluation and management  Merton Border 01/10/2014, 12:54 AM

## 2014-01-11 DIAGNOSIS — R6521 Severe sepsis with septic shock: Secondary | ICD-10-CM

## 2014-01-11 DIAGNOSIS — J9503 Malfunction of tracheostomy stoma: Secondary | ICD-10-CM

## 2014-01-11 DIAGNOSIS — A419 Sepsis, unspecified organism: Secondary | ICD-10-CM

## 2014-01-11 LAB — POCT I-STAT 3, ART BLOOD GAS (G3+)
BICARBONATE: 24.3 meq/L — AB (ref 20.0–24.0)
O2 Saturation: 98 %
TCO2: 25 mmol/L (ref 0–100)
pCO2 arterial: 35.8 mmHg (ref 35.0–45.0)
pH, Arterial: 7.437 (ref 7.350–7.450)
pO2, Arterial: 94 mmHg (ref 80.0–100.0)

## 2014-01-11 LAB — CBC WITH DIFFERENTIAL/PLATELET
BASOS ABS: 0 10*3/uL (ref 0.0–0.1)
Basophils Relative: 0 % (ref 0–1)
EOS ABS: 0 10*3/uL (ref 0.0–0.7)
Eosinophils Relative: 0 % (ref 0–5)
HCT: 27.4 % — ABNORMAL LOW (ref 36.0–46.0)
Hemoglobin: 8.8 g/dL — ABNORMAL LOW (ref 12.0–15.0)
Lymphocytes Relative: 45 % (ref 12–46)
Lymphs Abs: 2 10*3/uL (ref 0.7–4.0)
MCH: 29.1 pg (ref 26.0–34.0)
MCHC: 32.1 g/dL (ref 30.0–36.0)
MCV: 90.7 fL (ref 78.0–100.0)
Monocytes Absolute: 0.3 10*3/uL (ref 0.1–1.0)
Monocytes Relative: 6 % (ref 3–12)
NEUTROS PCT: 49 % (ref 43–77)
Neutro Abs: 2.1 10*3/uL (ref 1.7–7.7)
PLATELETS: 173 10*3/uL (ref 150–400)
RBC: 3.02 MIL/uL — ABNORMAL LOW (ref 3.87–5.11)
RDW: 15.6 % — AB (ref 11.5–15.5)
WBC: 4.4 10*3/uL (ref 4.0–10.5)

## 2014-01-11 LAB — BASIC METABOLIC PANEL
BUN: 19 mg/dL (ref 6–23)
CALCIUM: 7.7 mg/dL — AB (ref 8.4–10.5)
CO2: 22 mEq/L (ref 19–32)
Chloride: 110 mEq/L (ref 96–112)
Creatinine, Ser: 1.27 mg/dL — ABNORMAL HIGH (ref 0.50–1.10)
GFR calc Af Amer: 55 mL/min — ABNORMAL LOW (ref >90)
GFR, EST NON AFRICAN AMERICAN: 48 mL/min — AB (ref >90)
GLUCOSE: 109 mg/dL — AB (ref 70–99)
Potassium: 3.8 mEq/L (ref 3.7–5.3)
Sodium: 144 mEq/L (ref 137–147)

## 2014-01-11 LAB — PRO B NATRIURETIC PEPTIDE: Pro B Natriuretic peptide (BNP): 1067 pg/mL — ABNORMAL HIGH (ref 0–125)

## 2014-01-11 LAB — PHOSPHORUS: Phosphorus: 1.4 mg/dL — ABNORMAL LOW (ref 2.3–4.6)

## 2014-01-11 LAB — GLUCOSE, CAPILLARY
GLUCOSE-CAPILLARY: 105 mg/dL — AB (ref 70–99)
GLUCOSE-CAPILLARY: 113 mg/dL — AB (ref 70–99)
Glucose-Capillary: 108 mg/dL — ABNORMAL HIGH (ref 70–99)
Glucose-Capillary: 143 mg/dL — ABNORMAL HIGH (ref 70–99)
Glucose-Capillary: 149 mg/dL — ABNORMAL HIGH (ref 70–99)
Glucose-Capillary: 170 mg/dL — ABNORMAL HIGH (ref 70–99)

## 2014-01-11 LAB — PROCALCITONIN: Procalcitonin: 2.24 ng/mL

## 2014-01-11 LAB — PROTIME-INR
INR: 1.12 (ref 0.00–1.49)
Prothrombin Time: 14.2 seconds (ref 11.6–15.2)

## 2014-01-11 LAB — MAGNESIUM: Magnesium: 1.5 mg/dL (ref 1.5–2.5)

## 2014-01-11 MED ORDER — PNEUMOCOCCAL VAC POLYVALENT 25 MCG/0.5ML IJ INJ
0.5000 mL | INJECTION | INTRAMUSCULAR | Status: AC
Start: 2014-01-12 — End: 2014-01-12
  Administered 2014-01-12: 0.5 mL via INTRAMUSCULAR
  Filled 2014-01-11: qty 0.5

## 2014-01-11 MED ORDER — DEXMEDETOMIDINE HCL IN NACL 200 MCG/50ML IV SOLN
0.0000 ug/kg/h | INTRAVENOUS | Status: DC
  Administered 2014-01-11 (×2): 1.2 ug/kg/h via INTRAVENOUS
  Administered 2014-01-11 (×2): 0.5 ug/kg/h via INTRAVENOUS
  Administered 2014-01-11: 1 ug/kg/h via INTRAVENOUS
  Administered 2014-01-12 (×3): 1.2 ug/kg/h via INTRAVENOUS
  Filled 2014-01-11 (×9): qty 50

## 2014-01-11 MED ORDER — INFLUENZA VAC SPLIT QUAD 0.5 ML IM SUSP
0.5000 mL | INTRAMUSCULAR | Status: AC
  Administered 2014-01-12: 0.5 mL via INTRAMUSCULAR
  Filled 2014-01-11: qty 0.5

## 2014-01-11 MED ORDER — POTASSIUM PHOSPHATE DIBASIC 3 MMOLE/ML IV SOLN
24.0000 mmol | Freq: Once | INTRAVENOUS | Status: AC
  Administered 2014-01-11: 24 mmol via INTRAVENOUS
  Filled 2014-01-11: qty 8

## 2014-01-11 MED ORDER — MAGNESIUM SULFATE 40 MG/ML IJ SOLN
2.0000 g | Freq: Once | INTRAMUSCULAR | Status: AC
  Administered 2014-01-11: 2 g via INTRAVENOUS
  Filled 2014-01-11: qty 50

## 2014-01-11 MED ORDER — FENTANYL CITRATE 0.05 MG/ML IJ SOLN
100.0000 ug | INTRAMUSCULAR | Status: DC | PRN
  Administered 2014-01-11 – 2014-01-12 (×2): 100 ug via INTRAVENOUS
  Filled 2014-01-11 (×6): qty 2

## 2014-01-11 NOTE — Progress Notes (Signed)
PULMONARY / CRITICAL CARE MEDICINE   Name: Whitney Marks MRN: [id number] DOB: July 19, 1961    ADMISSION DATE:  01/09/2014  REFERRING MD :  EDP PRIMARY SERVICE: PCCM  CHIEF COMPLAINT:  Dyspnea  BRIEF PATIENT DESCRIPTION: 53 yo with history of tracheostomy, tracheal stenosis previous admissions for aspiration pneumonia, RADS, sepsis and opioid addiction presented 3/12 with dyspnea, worsening cough and fever.   has a past medical history of Pneumonia; Chronic pain; Arthritis; Difficult intubation; Shortness of breath; Sleep apnea; Cancer of cervix; Recurrent upper respiratory infection (URI); Headache(784.0); Anxiety; Neuromuscular disorder; Fibromyalgia; and Depression.   has past surgical history that includes Neck surgery.   SIGNIFICANT EVENTS / STUDIES:   LINES / TUBES: ETT 01/09/14 >> CVL 01/09/14 R IJ Aline   CULTURES: 3/12  Blood >>> 3/12  Urine >> 3/12  Sputum >>> 01/09/14 MRSA PCr - POSITIVE 3/12  BAL - mixed Results for orders placed during the hospital encounter of 01/09/14  CULTURE, BLOOD (ROUTINE X 2)     Status: None   Collection Time    01/09/14  5:05 PM      Result Value Ref Range Status   Specimen Description BLOOD ARM LEFT   Final   Special Requests BOTTLES DRAWN AEROBIC AND ANAEROBIC 5CC   Final   Culture  Setup Time     Final   Value: 01/09/2014 22:28     Performed at [facility]   Culture     Final   Value:        BLOOD CULTURE RECEIVED NO GROWTH TO DATE CULTURE WILL BE HELD FOR 5 DAYS BEFORE ISSUING A FINAL NEGATIVE REPORT     Performed at [facility]   Report Status PENDING   Incomplete  URINE CULTURE     Status: None   Collection Time    01/09/14  6:13 PM      Result Value Ref Range Status   Specimen Description URINE, CLEAN CATCH   Final   Special Requests NONE   Final   Culture  Setup Time     Final   Value: 01/09/2014 19:39     Performed at [place]     Final   Value: NO GROWTH     Performed at  [facility]   Culture     Final   Value: NO GROWTH     Performed at [facility]   Report Status 01/10/2014 FINAL   Final  CULTURE, RESPIRATORY (NON-EXPECTORATED)     Status: None   Collection Time    01/09/14  9:35 PM      Result Value Ref Range Status   Specimen Description BRONCHIAL ALVEOLAR LAVAGE   Final   Special Requests Normal   Final   Gram Stain     Final   Value: ABUNDANT WBC PRESENT, PREDOMINANTLY PMN     NO SQUAMOUS EPITHELIAL CELLS SEEN     ABUNDANT GRAM POSITIVE COCCI IN PAIRS     Performed at [facility]   Culture PENDING   Incomplete   Report Status PENDING   Incomplete  CULTURE, BAL-QUANTITATIVE     Status: None   Collection Time    01/09/14  9:35 PM      Result Value Ref Range Status   Specimen Description BRONCHIAL ALVEOLAR LAVAGE   Final   Special Requests Normal   Final   Gram Stain     Final   Value: ABUNDANT WBC PRESENT,  PREDOMINANTLY PMN     NO SQUAMOUS EPITHELIAL CELLS SEEN     RARE GRAM POSITIVE COCCI IN PAIRS     RARE GRAM NEGATIVE RODS     Performed at [place] PENDING   Incomplete   Culture PENDING   Incomplete   Report Status PENDING   Incomplete  MRSA PCR SCREENING     Status: Abnormal   Collection Time    01/09/14 10:11 PM      Result Value Ref Range Status   MRSA by PCR POSITIVE (*) NEGATIVE Final   Comment:            The GeneXpert MRSA Assay (FDA     approved for NASAL specimens     only), is one component of a     comprehensive MRSA colonization     surveillance program. It is not     intended to diagnose MRSA     infection nor to guide or     monitor treatment for     MRSA infections.     RESULT CALLED TO, READ BACK BY AND VERIFIED WITH:     J CLAUDIO,RN 01/10/14 0127 SHIPMAN M     ANTIBIOTICS: Vancomyicn 3/12 >>> Zosyn 3/12 >>> Levaquin 3/12 >>>  Anti-infectives   Start     Dose/Rate Route Frequency Ordered Stop   01/12/14 0000  levofloxacin (LEVAQUIN) IVPB 500 mg      500 mg 100 mL/hr over 60 Minutes Intravenous Every 48 hours 01/10/14 0107     01/11/14 2200  vancomycin (VANCOCIN) IVPB 750 mg/150 ml premix  Status:  Discontinued     750 mg 150 mL/hr over 60 Minutes Intravenous Every 48 hours 01/10/14 0107 01/10/14 1315   01/11/14 1000  vancomycin (VANCOCIN) 500 mg in sodium chloride 0.9 % 100 mL IVPB     500 mg 100 mL/hr over 60 Minutes Intravenous Every 24 hours 01/10/14 1315     01/10/14 2200  piperacillin-tazobactam (ZOSYN) IVPB 3.375 g     3.375 g 12.5 mL/hr over 240 Minutes Intravenous 3 times per day 01/10/14 1321     01/10/14 0115  vancomycin (VANCOCIN) IVPB 750 mg/150 ml premix     750 mg 150 mL/hr over 60 Minutes Intravenous  Once 01/10/14 0107 01/10/14 0233   01/10/14 0115  levofloxacin (LEVAQUIN) IVPB 750 mg     750 mg 100 mL/hr over 90 Minutes Intravenous  Once 01/10/14 0107 01/10/14 0304   01/10/14 0115  piperacillin-tazobactam (ZOSYN) IVPB 2.25 g  Status:  Discontinued     2.25 g 100 mL/hr over 30 Minutes Intravenous 4 times per day 01/10/14 0107 01/10/14 1321   01/09/14 1530  cefTRIAXone (ROCEPHIN) 1 g in dextrose 5 % 50 mL IVPB  Status:  Discontinued     1 g 100 mL/hr over 30 Minutes Intravenous  Once 01/09/14 1524 01/09/14 1701   01/09/14 1530  azithromycin (ZITHROMAX) 500 mg in dextrose 5 % 250 mL IVPB  Status:  Discontinued     500 mg 250 mL/hr over 60 Minutes Intravenous  Once 01/09/14 1524 01/09/14 1701     EVENTS  01/10/14: Now intubated. On perssors. Septic shock persists. Renal function improving.     SUBJECTIVE/OVERNIGHT/INTERVAL HX 01/11/14: Agitated overnight and precedex added. This morning: able to come off fentanyl. Improving pressor need  VITAL SIGNS: Temp:  [97.3 F (36.3 C)-100.3 F (37.9 C)] 98.3 F (36.8 C) (03/14 0339) Pulse Rate:  [53-88] 58 (03/14 0900) Resp:  [17-35]  35 (03/14 0900) BP: (84-168)/(37-100) 105/68 mmHg (03/14 0900) SpO2:  [90 %-100 %] 100 % (03/14 0900) Arterial Line BP:  (88-216)/(40-92) 133/64 mmHg (03/14 0900) FiO2 (%):  [40 %] 40 % (03/14 0400) Weight:  [58.9 kg (129 lb 13.6 oz)] 58.9 kg (129 lb 13.6 oz) (03/14 0500)  HEMODYNAMICS: CVP:  [13 mmHg] 13 mmHg VENTILATOR SETTINGS: Vent Mode:  [-] PRVC FiO2 (%):  [40 %] 40 % Set Rate:  [30 bmp-35 bmp] 35 bmp Vt Set:  [320 mL-400 mL] 400 mL PEEP:  [5 cmH20] 5 cmH20 Plateau Pressure:  [17 cmH20-26 cmH20] 26 cmH20 INTAKE / OUTPUT: Intake/Output     03/13 0701 - 03/14 0700 03/14 0701 - 03/15 0700   I.V. (mL/kg) 3339.9 (56.7) 126 (2.1)   NG/GT 320.8 120   IV Piggyback 100    Total Intake(mL/kg) 3760.7 (63.9) 246 (4.2)   Urine (mL/kg/hr) 1560 (1.1) 125 (0.8)   Total Output 1560 125   Net +2200.7 +121          PHYSICAL EXAMINATION: General:  Critically ill appearing. Sedated Neuro:  RASS -4 on precedex gtt   HEENT:  ET Ttube +  midline tracheostomy scar,  Cardiovascular:  Regular, Normal heart sounds Lungs:  Sync with vent. LLL crack;es + Abdomen:  Soft, non tender Musculoskeletal: grossly normal Skin:  No rash  LABS: PULMONARY  Recent Labs Lab 01/10/14 0130 01/10/14 1243 01/10/14 1642 01/10/14 2052 01/11/14 0006  PHART 7.185* 7.207* 7.183* 7.256* 7.437  PCO2ART 53.4* 51.4* 52.0* 43.3 35.8  PO2ART 210.0* 118.0* 108.0* 133.0* 94.0  HCO3 19.0* 20.2 19.3* 19.4* 24.3*  TCO2 20.6 22 21 21 25   O2SAT 99.6 97.0 96.0 99.0 98.0    CBC  Recent Labs Lab 01/09/14 1745 01/10/14 0456 01/11/14 0350  HGB 10.3* 10.0* 8.8*  HCT 31.9* 31.1* 27.4*  WBC 8.7 11.1* 4.4  PLT 212 241 173    COAGULATION  Recent Labs Lab 01/09/14 1745 01/11/14 0350  INR 1.08 1.12    CARDIAC    Recent Labs Lab 01/09/14 1705 01/10/14 0456  TROPONINI <0.30 <0.30    Recent Labs Lab 01/09/14 1705 01/11/14 0350  PROBNP 4590.0* 1067.0*     CHEMISTRY  Recent Labs Lab 01/09/14 1306 01/09/14 1701 01/10/14 0456 01/11/14 0350  NA 138 139 142 144  K 4.0 4.2 4.6 3.8  CL 102 104 110 110  CO2 17*  16* 19 22  GLUCOSE 109* 126* 135* 109*  BUN 50* 47* 34* 19  CREATININE 3.41* 3.36* 1.84* 1.27*  CALCIUM 8.3* 7.8* 7.6* 7.7*  MG  --  2.2  --  1.5  PHOS  --  6.2*  --  1.4*   Estimated Creatinine Clearance: 40.7 ml/min (by C-G formula based on Cr of 1.27).   LIVER  Recent Labs Lab 01/09/14 1701 01/09/14 1745 01/11/14 0350  AST 31  --   --   ALT 6  --   --   ALKPHOS 83  --   --   BILITOT 0.3  --   --   PROT 7.7  --   --   ALBUMIN 3.0*  --   --   INR  --  1.08 1.12     INFECTIOUS  Recent Labs Lab 01/09/14 2101 01/09/14 2315 01/10/14 0456 01/10/14 0501 01/11/14 0350  LATICACIDVEN  --  0.8  --  0.6  --   PROCALCITON 5.54  --  4.63  --  2.24     ENDOCRINE CBG (last 3)   Recent  Labs  01/11/14 0021 01/11/14 0338 01/11/14 0743  GLUCAP 108* 105* 113*         IMAGING x48h  Dg Chest Port 1 View  01/10/2014   CLINICAL DATA:  Respiratory failure  EXAM: PORTABLE CHEST - 1 VIEW  COMPARISON:  DG CHEST 1V PORT dated 01/09/2014  FINDINGS: The lungs are well-expanded. The interstitial markings remain increased but are perhaps slightly less prominent than on yesterday's study. There is no alveolar pneumonia. The cardiac silhouette is normal in size. The pulmonary vascularity is less engorged today. No significant pleural effusion is demonstrated. There is no evidence of a pneumothorax.  The endotracheal tube tip lie is 3.4 cm above the crotch of the carina. The esophagogastric tube tip in proximal port project off the film. Knee right internal jugular venous catheter tip lies in the region of the midportion of the SVC.  IMPRESSION: 1. There remain bilateral interstitial and early alveolar pulmonary parenchymal densities. There has been slight interval improvement since yesterday's study. The pulmonary vascularity is also more distinct today. 2. There remains mild prominence of the cardiac silhouette and central pulmonary vascularity but these are relatively stable. No pleural  effusion or pneumothorax is demonstrated. 3. The support tubes and lines are in acceptable position.   Electronically Signed   By: David  [place]   On: 01/10/2014 12:25   Dg Chest Port 1 View  01/09/2014   CLINICAL DATA:  Intubation, central line placement.  EXAM: PORTABLE CHEST - 1 VIEW  COMPARISON:  01/09/2014  FINDINGS: Endotracheal tube tip approximately 3.8 cm proximal to the carina. Left IJ catheter tip projects over the proximal to mid SVC. NG tube descends below the level of the image. Interstitial prominence. No pneumothorax or pleural effusion. Cardiomediastinal contours are similar to prior, within normal range. No acute osseous finding.  IMPRESSION: Endotracheal tube tip 3.8 cm proximal to the carina.  Left IJ catheter tip projects over the proximal to mid SVC. No pneumothorax.  Interstitial prominence persists; interstitial edema versus atypical pneumonia.   Electronically Signed   By: Jearld Lesch M.D.   On: 01/09/2014 23:51   Dg Chest Port 1 View  01/09/2014   CLINICAL DATA:  Weakness in legs, cough and congestion  EXAM: PORTABLE CHEST - 1 VIEW  COMPARISON:  DG CHEST 1V PORT dated 11/01/2011; DG CHEST 1V PORT dated 10/31/2011; DG CHEST 1V PORT dated 10/30/2011  FINDINGS: Mild cardiac enlargement stable. Mild vascular congestion new from prior study. Mild interstitial change, more prominent when compared to the prior study. Lines and tubes has been removed.  IMPRESSION: Findings suggest mild interstitial pulmonary edema. Atypical bilateral pneumonia is possible but considered less likely.   Electronically Signed   By: Esperanza Heir M.D.   On: 01/09/2014 14:56       ASSESSMENT / PLAN:  PULMONARY A: Acute respiratory failure s/p intubation 312!5 Aspiration pneumonia History of tracheostomy   - on 40% fio2/peep 5  P:   Can do SBT but mental status will preclude extubation Full vent support CXR and aBg stat   CARDIOVASCULAR A: Septic shock  - improved pressor need P:   Septic shock protocol  RENAL A: Acute renal failure - resolved   - new low mag and low phos  -  P:   Replete mag and phos Support with septic shock protocl  GASTROINTESTINAL A: Suspect dysphagia and chronic aspiration  P:   Tube feeds per nutrition; swallow eval post extubation needed NPO PPI  HEMATOLOGIC A:  Mild anemia VTE Px P:  Trend CBC Heparin for prophylaxis PRBC for hgb < 7gm%  INFECTIOUS A: Aspiration pneumonia with possible ALI P:   Cx / abx as above Await cultures  ENDOCRINE A: No active issues P:   No intervention required  NEUROLOGIC A: Acute encephalopathy Suspect opioid addiction / dependence : U tolx positive for benzo and opiopids. ETOH negative, ASA level and tylenol negative  - agiated encephalopathy. Appears better with precedex  P:   Dc fent gtt Use fent prn, versed prn Continue Hold Valium, Percocet, Opana, Lyrica, Trazodone  GLOBAL 3/13 and 01/11/14: no family at bedside. Consult social work and Clinical biochemist for [facility]  I have personally obtained history, examined patient, evaluated and interpreted laboratory and imaging results, reviewed medical records, formulated assessment / plan and placed orders.  CRITICAL CARE:  The patient is critically ill with multiple organ systems failure and requires high complexity decision making for assessment and support, frequent evaluation and titration of therapies, application of advanced monitoring technologies and extensive interpretation of multiple databases. Critical Care Time devoted to patient care services described in this note is 35 minutes.     Dr. Brand Males, M.D., [facility].C.P Pulmonary and Critical Care Medicine Staff Physician [facility] Pager: [phone], If no answer or between  15:00h - 7:00h: call [phone]  01/11/2014 9:33 AM

## 2014-01-11 NOTE — Progress Notes (Signed)
eLink Physician-Brief Progress Note Patient Name: Whitney Marks DOB: 05-09-1961 MRN: [id number]  Date of Service  01/11/2014   HPI/Events of Note   Patient in pain without available PRN.  eICU Interventions   PRN fentanyl.   Intervention Category Intermediate Interventions: Pain - evaluation and management  Navpreet Szczygiel R. 01/11/2014, 7:02 PM

## 2014-01-11 NOTE — Progress Notes (Signed)
eLink Physician-Brief Progress Note Patient Name: AZRIELLA MATTIA DOB: 1961/06/15 MRN: [id number]  Date of Service  01/11/2014   HPI/Events of Note  Was contacted earlier by bedside nurse concerning inadequate sedation and ongoing agitation in this vented patient.  Recommended continuing to titrate the fentanyl up to max dose and cont the versed prn.  Called to camera into room showing patient agitated with vent dyssynchrony and high peak pressures.  Remains on pressor support.   eICU Interventions  Plan Add precedex for additional sedation Continue fentanyl and titrate down once precedex on    Intervention Category Major Interventions: Delirium, psychosis, severe agitation - evaluation and management  DETERDING,ELIZABETH 01/11/2014, 6:17 AM

## 2014-01-11 NOTE — Progress Notes (Signed)
ANTIBIOTIC CONSULT NOTE - Follow-up  Pharmacy Consult for Vancocin, Zosyn, and Levaquin Indication: PNA  Allergies  Allergen Reactions  . Codeine Itching  . Pentazocine Lactate Other (See Comments)    hallucinates  . Ranitidine Hcl Other (See Comments)    blisters    Patient Measurements: Height: 5' 1.81" (157 cm) Weight: 129 lb 13.6 oz (58.9 kg) IBW/kg (Calculated) : 49.67  Vital Signs: Temp: 98.3 F (36.8 C) (03/14 0339) Temp src: Oral (03/14 0339) BP: 117/53 mmHg (03/14 1030) Pulse Rate: 63 (03/14 1030)  Labs:  Recent Labs  01/09/14 1701 01/09/14 1745 01/10/14 0456 01/11/14 0350  WBC  --  8.7 11.1* 4.4  HGB  --  10.3* 10.0* 8.8*  PLT  --  212 241 173  CREATININE 3.36*  --  1.84* 1.27*     Microbiology: Recent Results (from the past 720 hour(s))  CULTURE, BLOOD (ROUTINE X 2)     Status: None   Collection Time    01/09/14  5:05 PM      Result Value Ref Range Status   Specimen Description BLOOD ARM LEFT   Final   Special Requests BOTTLES DRAWN AEROBIC AND ANAEROBIC 5CC   Final   Culture  Setup Time     Final   Value: 01/09/2014 22:28     Performed at [facility]   Culture     Final   Value:        BLOOD CULTURE RECEIVED NO GROWTH TO DATE CULTURE WILL BE HELD FOR 5 DAYS BEFORE ISSUING A FINAL NEGATIVE REPORT     Performed at [facility]   Report Status PENDING   Incomplete  URINE CULTURE     Status: None   Collection Time    01/09/14  6:13 PM      Result Value Ref Range Status   Specimen Description URINE, CLEAN CATCH   Final   Special Requests NONE   Final   Culture  Setup Time     Final   Value: 01/09/2014 19:39     Performed at [place]     Final   Value: NO GROWTH     Performed at [facility]   Culture     Final   Value: NO GROWTH     Performed at [facility]   Report Status 01/10/2014 FINAL   Final  CULTURE, RESPIRATORY (NON-EXPECTORATED)     Status: None   Collection Time   01/09/14  9:35 PM      Result Value Ref Range Status   Specimen Description BRONCHIAL ALVEOLAR LAVAGE   Final   Special Requests Normal   Final   Gram Stain     Final   Value: ABUNDANT WBC PRESENT, PREDOMINANTLY PMN     NO SQUAMOUS EPITHELIAL CELLS SEEN     ABUNDANT GRAM POSITIVE COCCI IN PAIRS     Performed at [facility]   Culture PENDING   Incomplete   Report Status PENDING   Incomplete  CULTURE, BAL-QUANTITATIVE     Status: None   Collection Time    01/09/14  9:35 PM      Result Value Ref Range Status   Specimen Description BRONCHIAL ALVEOLAR LAVAGE   Final   Special Requests Normal   Final   Gram Stain     Final   Value: ABUNDANT WBC PRESENT, PREDOMINANTLY PMN     NO SQUAMOUS EPITHELIAL CELLS SEEN     RARE  GRAM POSITIVE COCCI IN PAIRS     RARE GRAM NEGATIVE RODS     Performed at [facility] PENDING   Incomplete   Culture PENDING   Incomplete   Report Status PENDING   Incomplete  MRSA PCR SCREENING     Status: Abnormal   Collection Time    01/09/14 10:11 PM      Result Value Ref Range Status   MRSA by PCR POSITIVE (*) NEGATIVE Final   Comment:            The GeneXpert MRSA Assay (FDA     approved for NASAL specimens     only), is one component of a     comprehensive MRSA colonization     surveillance program. It is not     intended to diagnose MRSA     infection nor to guide or     monitor treatment for     MRSA infections.     RESULT CALLED TO, READ BACK BY AND VERIFIED WITH:     J CLAUDIO,RN 01/10/14 0127 SHIPMAN M   Assessment: 53yo female on Vancomycin and Zosyn and Levaquin (Day #2) for PNA. Pt required intubation 3/13. SCr improved to 1.27 and current CrCl ~ 28ml/min.   3/12 Bld x2 >>ngtd 3/12 Urine >>neg 3/12 Sputum >> 3/12 BAL >> gm stain- abundant GPC pairs/GNR 3/12 MRSA PCR +  Vanc 3/12 >> Zosyn 3/12 >> Levo 3/12 >>  Goal of Therapy:  Vancomycin trough level 15-20 mcg/ml  Plan:  1) Continue Zosyn to 3.375g IV  q8h (4 hr extended interval infusion) 2) Change Vanc to 500mg  IV q12h 3) Continue Levaquin 500mg  IV q48h. 4) Will f/u micro data, pt's clinical condition, renal function 5) Vanc trough prn  Sherlon Handing, PharmD, BCPS Clinical pharmacist, pager [phone] 01/11/2014,10:52 AM

## 2014-01-12 ENCOUNTER — Inpatient Hospital Stay (HOSPITAL_COMMUNITY): Payer: Medicare Other

## 2014-01-12 DIAGNOSIS — G8921 Chronic pain due to trauma: Secondary | ICD-10-CM

## 2014-01-12 LAB — BASIC METABOLIC PANEL
BUN: 12 mg/dL (ref 6–23)
CALCIUM: 7.6 mg/dL — AB (ref 8.4–10.5)
CO2: 23 mEq/L (ref 19–32)
Chloride: 106 mEq/L (ref 96–112)
Creatinine, Ser: 0.86 mg/dL (ref 0.50–1.10)
GFR calc Af Amer: 88 mL/min — ABNORMAL LOW (ref >90)
GFR, EST NON AFRICAN AMERICAN: 76 mL/min — AB (ref >90)
GLUCOSE: 172 mg/dL — AB (ref 70–99)
Potassium: 3.3 mEq/L — ABNORMAL LOW (ref 3.7–5.3)
SODIUM: 140 meq/L (ref 137–147)

## 2014-01-12 LAB — MAGNESIUM: Magnesium: 1.5 mg/dL (ref 1.5–2.5)

## 2014-01-12 LAB — GLUCOSE, CAPILLARY
GLUCOSE-CAPILLARY: 110 mg/dL — AB (ref 70–99)
GLUCOSE-CAPILLARY: 126 mg/dL — AB (ref 70–99)
Glucose-Capillary: 114 mg/dL — ABNORMAL HIGH (ref 70–99)
Glucose-Capillary: 133 mg/dL — ABNORMAL HIGH (ref 70–99)
Glucose-Capillary: 134 mg/dL — ABNORMAL HIGH (ref 70–99)
Glucose-Capillary: 145 mg/dL — ABNORMAL HIGH (ref 70–99)

## 2014-01-12 LAB — CBC WITH DIFFERENTIAL/PLATELET
Basophils Absolute: 0.1 10*3/uL (ref 0.0–0.1)
Basophils Relative: 2 % — ABNORMAL HIGH (ref 0–1)
EOS ABS: 0 10*3/uL (ref 0.0–0.7)
Eosinophils Relative: 0 % (ref 0–5)
HEMATOCRIT: 28.7 % — AB (ref 36.0–46.0)
Hemoglobin: 9.4 g/dL — ABNORMAL LOW (ref 12.0–15.0)
Lymphocytes Relative: 50 % — ABNORMAL HIGH (ref 12–46)
Lymphs Abs: 2.4 10*3/uL (ref 0.7–4.0)
MCH: 29.6 pg (ref 26.0–34.0)
MCHC: 32.8 g/dL (ref 30.0–36.0)
MCV: 90.3 fL (ref 78.0–100.0)
MONO ABS: 0.3 10*3/uL (ref 0.1–1.0)
Monocytes Relative: 6 % (ref 3–12)
NEUTROS ABS: 2.1 10*3/uL (ref 1.7–7.7)
Neutrophils Relative %: 42 % — ABNORMAL LOW (ref 43–77)
Platelets: 139 10*3/uL — ABNORMAL LOW (ref 150–400)
RBC: 3.18 MIL/uL — ABNORMAL LOW (ref 3.87–5.11)
RDW: 15.5 % (ref 11.5–15.5)
WBC: 4.9 10*3/uL (ref 4.0–10.5)

## 2014-01-12 LAB — PHOSPHORUS: Phosphorus: 1.4 mg/dL — ABNORMAL LOW (ref 2.3–4.6)

## 2014-01-12 MED ORDER — FENTANYL CITRATE 0.05 MG/ML IJ SOLN
200.0000 ug | Freq: Once | INTRAMUSCULAR | Status: AC
  Administered 2014-01-12: 200 ug via INTRAVENOUS

## 2014-01-12 MED ORDER — POTASSIUM PHOSPHATE DIBASIC 3 MMOLE/ML IV SOLN
30.0000 mmol | Freq: Once | INTRAVENOUS | Status: AC
  Administered 2014-01-12: 30 mmol via INTRAVENOUS
  Filled 2014-01-12: qty 10

## 2014-01-12 MED ORDER — FENTANYL BOLUS VIA INFUSION
50.0000 ug | INTRAVENOUS | Status: DC | PRN
  Administered 2014-01-15: 100 ug via INTRAVENOUS
  Administered 2014-01-15 (×2): 75 ug via INTRAVENOUS
  Administered 2014-01-16: 50 ug via INTRAVENOUS
  Administered 2014-01-16: 75 ug via INTRAVENOUS
  Administered 2014-01-16 (×2): 100 ug via INTRAVENOUS
  Filled 2014-01-12: qty 100

## 2014-01-12 MED ORDER — LEVOFLOXACIN IN D5W 750 MG/150ML IV SOLN
750.0000 mg | INTRAVENOUS | Status: AC
  Administered 2014-01-12 – 2014-01-15 (×4): 750 mg via INTRAVENOUS
  Filled 2014-01-12 (×4): qty 150

## 2014-01-12 MED ORDER — ATROPINE SULFATE 0.1 MG/ML IJ SOLN
INTRAMUSCULAR | Status: AC
  Filled 2014-01-12: qty 10

## 2014-01-12 MED ORDER — FENTANYL CITRATE 0.05 MG/ML IJ SOLN: 50.0000 ug | Freq: Once | INTRAMUSCULAR | Status: AC

## 2014-01-12 MED ORDER — SODIUM CHLORIDE 0.9 % IV SOLN
0.0000 ug/h | INTRAVENOUS | Status: DC
  Administered 2014-01-12: 250 ug/h via INTRAVENOUS
  Administered 2014-01-12: 100 ug/h via INTRAVENOUS
  Administered 2014-01-13: 300 ug/h via INTRAVENOUS
  Administered 2014-01-13: 400 ug/h via INTRAVENOUS
  Administered 2014-01-13: 300 ug/h via INTRAVENOUS
  Administered 2014-01-14: 250 ug/h via INTRAVENOUS
  Administered 2014-01-14 (×2): 100 ug/h via INTRAVENOUS
  Administered 2014-01-15: 150 ug/h via INTRAVENOUS
  Administered 2014-01-16: 200 ug/h via INTRAVENOUS
  Filled 2014-01-12 (×9): qty 50

## 2014-01-12 MED ORDER — MAGNESIUM SULFATE 40 MG/ML IJ SOLN
2.0000 g | Freq: Once | INTRAMUSCULAR | Status: AC
  Administered 2014-01-12: 2 g via INTRAVENOUS
  Filled 2014-01-12: qty 50

## 2014-01-12 MED ORDER — HALOPERIDOL LACTATE 5 MG/ML IJ SOLN
5.0000 mg | Freq: Four times a day (QID) | INTRAMUSCULAR | Status: DC
  Administered 2014-01-12 – 2014-01-15 (×13): 5 mg via INTRAVENOUS
  Filled 2014-01-12 (×19): qty 1

## 2014-01-12 MED ORDER — DEXMEDETOMIDINE HCL IN NACL 400 MCG/100ML IV SOLN
0.4000 ug/kg/h | INTRAVENOUS | Status: DC
  Administered 2014-01-12: 0.6 ug/kg/h via INTRAVENOUS
  Administered 2014-01-12: 1.2 ug/kg/h via INTRAVENOUS
  Administered 2014-01-13: 0.4 ug/kg/h via INTRAVENOUS
  Filled 2014-01-12 (×3): qty 100

## 2014-01-12 NOTE — Progress Notes (Addendum)
ANTIBIOTIC CONSULT NOTE - Follow-up  Pharmacy Consult for Vancocin, Zosyn, and Levaquin Indication: PNA  Allergies  Allergen Reactions  . Codeine Itching  . Pentazocine Lactate Other (See Comments)    hallucinates  . Ranitidine Hcl Other (See Comments)    blisters    Patient Measurements: Height: 5' 1.81" (157 cm) Weight: 130 lb 15.3 oz (59.4 kg) IBW/kg (Calculated) : 49.67  Vital Signs: Temp: 98.5 F (36.9 C) (03/15 0741) Temp src: Oral (03/15 0741) BP: 137/76 mmHg (03/15 0900) Pulse Rate: 59 (03/15 0900)  Labs:  Recent Labs  01/10/14 0456 01/11/14 0350 01/12/14 0458  WBC 11.1* 4.4 4.9  HGB 10.0* 8.8* 9.4*  PLT 241 173 139*  CREATININE 1.84* 1.27* 0.86     Microbiology: Recent Results (from the past 720 hour(s))  CULTURE, BLOOD (ROUTINE X 2)     Status: None   Collection Time    01/09/14  5:05 PM      Result Value Ref Range Status   Specimen Description BLOOD ARM LEFT   Final   Special Requests BOTTLES DRAWN AEROBIC AND ANAEROBIC 5CC   Final   Culture  Setup Time     Final   Value: 01/09/2014 22:28     Performed at [facility]   Culture     Final   Value:        BLOOD CULTURE RECEIVED NO GROWTH TO DATE CULTURE WILL BE HELD FOR 5 DAYS BEFORE ISSUING A FINAL NEGATIVE REPORT     Performed at [facility]   Report Status PENDING   Incomplete  URINE CULTURE     Status: None   Collection Time    01/09/14  6:13 PM      Result Value Ref Range Status   Specimen Description URINE, CLEAN CATCH   Final   Special Requests NONE   Final   Culture  Setup Time     Final   Value: 01/09/2014 19:39     Performed at [place]     Final   Value: NO GROWTH     Performed at [facility]   Culture     Final   Value: NO GROWTH     Performed at [facility]   Report Status 01/10/2014 FINAL   Final  CULTURE, RESPIRATORY (NON-EXPECTORATED)     Status: None   Collection Time    01/09/14  9:35 PM      Result Value  Ref Range Status   Specimen Description BRONCHIAL ALVEOLAR LAVAGE   Final   Special Requests Normal   Final   Gram Stain     Final   Value: ABUNDANT WBC PRESENT, PREDOMINANTLY PMN     NO SQUAMOUS EPITHELIAL CELLS SEEN     ABUNDANT GRAM POSITIVE COCCI IN PAIRS     Performed at [facility]   Culture     Final   Value: Culture reincubated for better growth     Performed at [facility]   Report Status PENDING   Incomplete  CULTURE, BAL-QUANTITATIVE     Status: None   Collection Time    01/09/14  9:35 PM      Result Value Ref Range Status   Specimen Description BRONCHIAL ALVEOLAR LAVAGE   Final   Special Requests Normal   Final   Gram Stain     Final   Value: ABUNDANT WBC PRESENT, PREDOMINANTLY PMN     NO SQUAMOUS EPITHELIAL CELLS  SEEN     RARE GRAM POSITIVE COCCI IN PAIRS     RARE GRAM NEGATIVE RODS     Performed at [facility] PENDING   Incomplete   Culture     Final   Value: Culture reincubated for better growth     Performed at [facility]   Report Status PENDING   Incomplete  MRSA PCR SCREENING     Status: Abnormal   Collection Time    01/09/14 10:11 PM      Result Value Ref Range Status   MRSA by PCR POSITIVE (*) NEGATIVE Final   Comment:            The GeneXpert MRSA Assay (FDA     approved for NASAL specimens     only), is one component of a     comprehensive MRSA colonization     surveillance program. It is not     intended to diagnose MRSA     infection nor to guide or     monitor treatment for     MRSA infections.     RESULT CALLED TO, READ BACK BY AND VERIFIED WITH:     J CLAUDIO,RN 01/10/14 0127 SHIPMAN M  CULTURE, BLOOD (ROUTINE X 2)     Status: None   Collection Time    01/09/14 10:15 PM      Result Value Ref Range Status   Specimen Description BLOOD HAND RIGHT   Final   Special Requests BOTTLES DRAWN AEROBIC ONLY 5CC   Final   Culture  Setup Time     Final   Value: 01/10/2014 03:55     Performed at  [facility]   Culture     Final   Value:        BLOOD CULTURE RECEIVED NO GROWTH TO DATE CULTURE WILL BE HELD FOR 5 DAYS BEFORE ISSUING A FINAL NEGATIVE REPORT     Performed at [facility]   Report Status PENDING   Incomplete   Assessment: 53yo female on Vancomycin and Zosyn and Levaquin (Day #3) for PNA. Pt required intubation 3/13. SCr improved to 0.86 and current CrCl ~ 20ml/min.   3/12 Bld x2 >>ngtd 3/12 Urine >>neg 3/12 BAL >> gm stain- abundant GPC pairs/GNR 3/12 MRSA PCR +  Vanc 3/12 >> Zosyn 3/12 >> Levo 3/12 >>  Goal of Therapy:  Vancomycin trough level 15-20 mcg/ml  Plan:  1) Continue Zosyn 3.375g IV q8h (4 hr extended interval infusion) 2) Continue Vanc 500mg  IV q12h 3) Change Levaquin to 750mg  IV q24h. 4) Will f/u micro data, pt's clinical condition, renal function 5) Vanc trough prn  Sherlon Handing, PharmD, BCPS Clinical pharmacist, pager [phone] 01/12/2014,10:30 AM   Addendum: Noted Mg 1.5 (same as yesterday) even after 2gm IV magnesium given yesterday. Spoke with Dr. Chase Caller and he would like to give 2gm IV magnesium sulfate today.  Sherlon Handing, PharmD, BCPS Clinical pharmacist, pager [phone] 01/12/2014  11:08 AM

## 2014-01-12 NOTE — Progress Notes (Signed)
PULMONARY / CRITICAL CARE MEDICINE   Name: Whitney Marks MRN: [id number] DOB: 07/10/1961    ADMISSION DATE:  01/09/2014  REFERRING MD :  EDP PRIMARY SERVICE: PCCM  CHIEF COMPLAINT:  Dyspnea  BRIEF PATIENT DESCRIPTION: 53 yo with history of tracheostomy, tracheal stenosis previous admissions for aspiration pneumonia, RADS, sepsis and opioid addiction presented 3/12 with dyspnea, worsening cough and fever.   has a past medical history of Pneumonia; Chronic pain; Arthritis; Difficult intubation; Shortness of breath; Sleep apnea; Cancer of cervix; Recurrent upper respiratory infection (URI); Headache(784.0); Anxiety; Neuromuscular disorder; Fibromyalgia; and Depression.   has past surgical history that includes Neck surgery.   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 On perssors. Septic shock persists. Renal function improving.  01/11/14: Agitated overnight and precedex added. This morning: able to come off fentanyl. Improving pressor need    SUBJECTIVE/OVERNIGHT/INTERVAL HX   01/12/14: Still on pressors. Very agitated and diaphoretic RASS +4. RN indicating patient does not want to remain intubated (husband with opposing view)  On fentanyl alone. Increased resp secrestions per RT  VITAL SIGNS: Temp:  [98.1 F (36.7 C)-100 F (37.8 C)] 100 F (37.8 C) (03/15 0433) Pulse Rate:  [44-81] 66 (03/15 0700) Resp:  [17-37] 30 (03/15 0700) BP: (97-179)/(53-100) 138/84 mmHg (03/15 0700) SpO2:  [99 %-100 %] 100 % (03/15 0700) Arterial Line BP: (108-157)/(54-78) 108/61 mmHg (03/14 1300) FiO2 (%):  [40 %] 40 % (03/15 0700) Weight:  [59.4 kg (130 lb 15.3 oz)] 59.4 kg  (130 lb 15.3 oz) (03/15 0450)  HEMODYNAMICS: CVP:  [13 mmHg-14 mmHg] 14 mmHg VENTILATOR SETTINGS: Vent Mode:  [-] PRVC FiO2 (%):  [40 %] 40 % Set Rate:  [35 bmp] 35 bmp Vt Set:  [400 mL] 400 mL PEEP:  [5 cmH20] 5 cmH20 Pressure Support:  [12 cmH20] 12 cmH20 Plateau Pressure:  [25 cmH20-27 cmH20] 25 cmH20 INTAKE / OUTPUT: Intake/Output     03/14 0701 - 03/15 0700 03/15 0701 - 03/16 0700   I.V. (mL/kg) 1123.1 (18.9)    NG/GT 570    IV Piggyback 708    Total Intake(mL/kg) 2401.1 (40.4)    Urine (mL/kg/hr) 1180 (0.8)    Total Output 1180     Net +1221.1          Stool Occurrence 1 x      PHYSICAL EXAMINATION: General:  Critically ill appearing. Sedated Neuro:  RASS -4 on precedex gtt   HEENT:  ET Ttube +  midline tracheostomy scar,  Cardiovascular:  Regular, Normal heart sounds Lungs:  Sync with vent. LLL crack;es + Abdomen:  Soft, non tender Musculoskeletal: grossly normal Skin:  No rash  LABS: PULMONARY  Recent Labs Lab 01/10/14 0130 01/10/14 1243 01/10/14 1642 01/10/14 2052 01/11/14 0006  PHART 7.185* 7.207* 7.183* 7.256* 7.437  PCO2ART 53.4* 51.4* 52.0* 43.3 35.8  PO2ART 210.0* 118.0* 108.0* 133.0* 94.0  HCO3 19.0* 20.2 19.3* 19.4* 24.3*  TCO2 20.6 22 21 21 25   O2SAT 99.6 97.0 96.0 99.0 98.0    CBC  Recent Labs Lab 01/10/14 0456 01/11/14 0350 01/12/14 0458  HGB 10.0* 8.8* 9.4*  HCT 31.1* 27.4* 28.7*  WBC 11.1* 4.4 4.9  PLT 241 173 139*    COAGULATION  Recent Labs Lab 01/09/14 1745 01/11/14 0350  INR 1.08 1.12    CARDIAC    Recent Labs Lab 01/09/14 1705 01/10/14 0456  TROPONINI <0.30 <0.30    Recent Labs Lab 01/09/14 1705 01/11/14 0350  PROBNP 4590.0* 1067.0*     CHEMISTRY  Recent Labs Lab 01/09/14 1306 01/09/14 1701 01/10/14 0456 01/11/14 0350 01/12/14 0458  NA 138 139 142 144 140  K 4.0 4.2 4.6 3.8 3.3*  CL 102 104 110 110 106  CO2 17* 16* 19 22 23   GLUCOSE 109* 126* 135* 109* 172*  BUN 50* 47* 34* 19 12   CREATININE 3.41* 3.36* 1.84* 1.27* 0.86  CALCIUM 8.3* 7.8* 7.6* 7.7* 7.6*  MG  --  2.2  --  1.5 1.5  PHOS  --  6.2*  --  1.4* 1.4*   Estimated Creatinine Clearance: 60 ml/min (by C-G formula based on Cr of 0.86).   LIVER  Recent Labs Lab 01/09/14 1701 01/09/14 1745 01/11/14 0350  AST 31  --   --   ALT 6  --   --   ALKPHOS 83  --   --   BILITOT 0.3  --   --   PROT 7.7  --   --   ALBUMIN 3.0*  --   --   INR  --  1.08 1.12     INFECTIOUS  Recent Labs Lab 01/09/14 2101 01/09/14 2315 01/10/14 0456 01/10/14 0501 01/11/14 0350  LATICACIDVEN  --  0.8  --  0.6  --   PROCALCITON 5.54  --  4.63  --  2.24     ENDOCRINE CBG (last 3)   Recent Labs  01/12/14 0019 01/12/14 0420 01/12/14 0735  GLUCAP 133* 134* 110*         IMAGING x48h  Dg Chest Port 1 View  01/10/2014   CLINICAL DATA:  Respiratory failure  EXAM: PORTABLE CHEST - 1 VIEW  COMPARISON:  DG CHEST 1V PORT dated 01/09/2014  FINDINGS: The lungs are well-expanded. The interstitial markings remain increased but are perhaps slightly less prominent than on yesterday's study. There is no alveolar pneumonia. The cardiac silhouette is normal in size. The pulmonary vascularity is less engorged today. No significant pleural effusion is demonstrated. There is no evidence of a pneumothorax.  The endotracheal tube tip lie is 3.4 cm above the crotch of the carina. The esophagogastric tube tip in proximal port project off the film. Knee right internal jugular venous catheter tip lies in the region of the midportion of the SVC.  IMPRESSION: 1. There remain bilateral interstitial and early alveolar pulmonary parenchymal densities. There has been slight interval improvement since yesterday's study. The pulmonary vascularity is also more distinct today. 2. There remains mild prominence of the cardiac silhouette and central pulmonary vascularity but these are relatively stable. No pleural effusion or pneumothorax is demonstrated. 3.  The support tubes and lines are in acceptable position.   Electronically Signed   By: David  Martinique   On: 01/10/2014 12:25       ASSESSMENT / PLAN:  PULMONARY A: Acute respiratory failure s/p intubation 312!5 Aspiration pneumonia History of tracheostomy   - on 40% fio2/peep 5 but agitation, pulmonary infiltrates and increased secretions preclude extubation  P:   Can do SBT but mental status will preclude extubation ? Will need repeat trach; need to reconcile with her prior wishes and husband wishes Full vent support  CARDIOVASCULAR A: Septic shock  - improved pressor need P:  Septic shock protocol  RENAL A: Acute renal failure - resolved   - low mag and low phos, corrected by elink  -  P:   Support    GASTROINTESTINAL A: Suspect dysphagia and chronic aspiration  P:   Tube feeds per nutrition; swallow eval post extubation needed NPO PPI  HEMATOLOGIC A:   Mild anemia VTE Px P:  Trend CBC Heparin for prophylaxis PRBC for hgb < 7gm%  INFECTIOUS A: Aspiration pneumonia with possible ALI P:   Cx / abx as above Await cultures  ENDOCRINE A: No active issues P:   No intervention required  NEUROLOGIC A: Acute encephalopathy Suspect opioid addiction / dependence : U tolx positive for benzo and opiopids. ETOH negative, ASA level and tylenol negative  - agiated encephalopathy severe  P:   Restart fent gtt Use fent prn, versed prn Continue precedex Add haldol scheduled; recheck qtc 01/13/14 Hold Valium, Percocet, Opana, Lyrica, Trazodone; might need gentle restart if agitation persists  GLOBAL 3/13 and 01/11/14 and 01/13/14: no family at bedside and no updates from MD. Consulted social work and Clinical biochemist for [place]; Will need goals of care reclarified  I have personally obtained history, examined patient, evaluated and interpreted laboratory and imaging results, reviewed medical records, formulated assessment / plan and placed  orders.  CRITICAL CARE:  The patient is critically ill with multiple organ systems failure and requires high complexity decision making for assessment and support, frequent evaluation and titration of therapies, application of advanced monitoring technologies and extensive interpretation of multiple databases. Critical Care Time devoted to patient care services described in this note is 35 minutes.     Dr. Brand Males, M.D., [facility].C.P Pulmonary and Critical Care Medicine Staff Physician [place] [facility] Pager: [phone], If no answer or between  15:00h - 7:00h: call [phone]  01/12/2014 7:40 AM

## 2014-01-12 NOTE — Progress Notes (Signed)
eLink Physician-Brief Progress Note Patient Name: Whitney Marks DOB: 09-15-1961 MRN: [id number]  Date of Service  01/12/2014   HPI/Events of Note  Hypokalemia and hypophos   eICU Interventions  Potassium and phos replaced   Intervention Category Intermediate Interventions: Electrolyte abnormality - evaluation and management  Aloma Boch 01/12/2014, 5:45 AM

## 2014-01-12 NOTE — Progress Notes (Signed)
Pt meets wean criteria, but no wean at this time. Pt was very agitated at shift change, almost self extubated (ETT was found at about 19cm), and very diaphoretic. Pt's sedation was increased at the time to help her to relax. Wean will be attempted at later time today. RT will continue to monitor.

## 2014-01-13 ENCOUNTER — Inpatient Hospital Stay (HOSPITAL_COMMUNITY): Payer: Medicare Other

## 2014-01-13 LAB — CBC WITH DIFFERENTIAL/PLATELET
BASOS ABS: 0 10*3/uL (ref 0.0–0.1)
Basophils Relative: 0 % (ref 0–1)
EOS ABS: 0.1 10*3/uL (ref 0.0–0.7)
Eosinophils Relative: 2 % (ref 0–5)
HEMATOCRIT: 27.6 % — AB (ref 36.0–46.0)
Hemoglobin: 9.1 g/dL — ABNORMAL LOW (ref 12.0–15.0)
LYMPHS PCT: 45 % (ref 12–46)
Lymphs Abs: 2.3 10*3/uL (ref 0.7–4.0)
MCH: 29.4 pg (ref 26.0–34.0)
MCHC: 33 g/dL (ref 30.0–36.0)
MCV: 89.3 fL (ref 78.0–100.0)
MONO ABS: 0.4 10*3/uL (ref 0.1–1.0)
Monocytes Relative: 8 % (ref 3–12)
NEUTROS ABS: 2.2 10*3/uL (ref 1.7–7.7)
Neutrophils Relative %: 45 % (ref 43–77)
Platelets: 114 10*3/uL — ABNORMAL LOW (ref 150–400)
RBC: 3.09 MIL/uL — ABNORMAL LOW (ref 3.87–5.11)
RDW: 15.1 % (ref 11.5–15.5)
WBC: 5 10*3/uL (ref 4.0–10.5)

## 2014-01-13 LAB — GLUCOSE, CAPILLARY
GLUCOSE-CAPILLARY: 112 mg/dL — AB (ref 70–99)
GLUCOSE-CAPILLARY: 118 mg/dL — AB (ref 70–99)
GLUCOSE-CAPILLARY: 111 mg/dL — AB (ref 70–99)
Glucose-Capillary: 124 mg/dL — ABNORMAL HIGH (ref 70–99)
Glucose-Capillary: 94 mg/dL (ref 70–99)
Glucose-Capillary: 98 mg/dL (ref 70–99)

## 2014-01-13 LAB — POCT I-STAT 3, ART BLOOD GAS (G3+)
Acid-Base Excess: 6 mmol/L — ABNORMAL HIGH (ref 0.0–2.0)
BICARBONATE: 30.9 meq/L — AB (ref 20.0–24.0)
O2 SAT: 97 %
PCO2 ART: 44.7 mmHg (ref 35.0–45.0)
Patient temperature: 97.8
TCO2: 32 mmol/L (ref 0–100)
pH, Arterial: 7.447 (ref 7.350–7.450)
pO2, Arterial: 91 mmHg (ref 80.0–100.0)

## 2014-01-13 LAB — CULTURE, RESPIRATORY W GRAM STAIN

## 2014-01-13 LAB — BASIC METABOLIC PANEL
BUN: 10 mg/dL (ref 6–23)
CO2: 26 mEq/L (ref 19–32)
Calcium: 7.7 mg/dL — ABNORMAL LOW (ref 8.4–10.5)
Chloride: 106 mEq/L (ref 96–112)
Creatinine, Ser: 0.74 mg/dL (ref 0.50–1.10)
GFR calc Af Amer: >90 mL/min (ref >90)
GLUCOSE: 102 mg/dL — AB (ref 70–99)
Potassium: 3.2 mEq/L — ABNORMAL LOW (ref 3.7–5.3)
SODIUM: 142 meq/L (ref 137–147)

## 2014-01-13 LAB — CULTURE, RESPIRATORY: SPECIAL REQUESTS: NORMAL

## 2014-01-13 LAB — PHOSPHORUS: Phosphorus: 2.3 mg/dL (ref 2.3–4.6)

## 2014-01-13 LAB — PROTIME-INR
INR: 1.1 (ref 0.00–1.49)
Prothrombin Time: 14 seconds (ref 11.6–15.2)

## 2014-01-13 LAB — CULTURE, BAL-QUANTITATIVE: Special Requests: NORMAL

## 2014-01-13 LAB — CULTURE, BAL-QUANTITATIVE W GRAM STAIN: Colony Count: >=100000

## 2014-01-13 LAB — APTT: APTT: 32 s (ref 24–37)

## 2014-01-13 LAB — MAGNESIUM: MAGNESIUM: 1.6 mg/dL (ref 1.5–2.5)

## 2014-01-13 MED ORDER — FUROSEMIDE 10 MG/ML IJ SOLN
40.0000 mg | Freq: Two times a day (BID) | INTRAMUSCULAR | Status: DC
  Administered 2014-01-13 (×2): 40 mg via INTRAVENOUS
  Filled 2014-01-13 (×4): qty 4

## 2014-01-13 MED ORDER — SODIUM CHLORIDE 0.9 % IV SOLN
INTRAVENOUS | Status: DC
  Administered 2014-01-13 – 2014-01-16 (×2): via INTRAVENOUS

## 2014-01-13 MED ORDER — PREGABALIN 50 MG PO CAPS
150.0000 mg | ORAL_CAPSULE | Freq: Two times a day (BID) | ORAL | Status: DC
  Administered 2014-01-13 – 2014-01-14 (×3): 150 mg via ORAL
  Filled 2014-01-13 (×4): qty 3

## 2014-01-13 MED ORDER — PROPOFOL 10 MG/ML IV EMUL
5.0000 ug/kg/min | INTRAVENOUS | Status: DC
  Administered 2014-01-13 – 2014-01-14 (×2): 10 ug/kg/min via INTRAVENOUS
  Filled 2014-01-13: qty 100

## 2014-01-13 MED ORDER — DIAZEPAM 5 MG PO TABS: 5.0000 mg | ORAL_TABLET | Freq: Three times a day (TID) | ORAL | Status: DC | PRN

## 2014-01-13 MED ORDER — MAGNESIUM SULFATE 40 MG/ML IJ SOLN
2.0000 g | Freq: Once | INTRAMUSCULAR | Status: AC
  Administered 2014-01-13: 2 g via INTRAVENOUS

## 2014-01-13 MED ORDER — MAGNESIUM SULFATE 50 % IJ SOLN
2.0000 g | Freq: Once | INTRAVENOUS | Status: DC
  Filled 2014-01-13: qty 4

## 2014-01-13 MED ORDER — MAGNESIUM SULFATE 40 MG/ML IJ SOLN
2.0000 g | Freq: Once | INTRAMUSCULAR | Status: AC
  Administered 2014-01-13: 2 g via INTRAVENOUS
  Filled 2014-01-13: qty 50

## 2014-01-13 MED ORDER — TRAZODONE HCL 100 MG PO TABS
100.0000 mg | ORAL_TABLET | Freq: Every day | ORAL | Status: DC
  Administered 2014-01-13 – 2014-01-14 (×2): 100 mg via ORAL
  Filled 2014-01-13 (×4): qty 1

## 2014-01-13 MED ORDER — POTASSIUM CHLORIDE 20 MEQ/15ML (10%) PO LIQD
30.0000 meq | ORAL | Status: AC
  Administered 2014-01-13 (×2): 30 meq
  Filled 2014-01-13 (×2): qty 30

## 2014-01-13 MED ORDER — PROPOFOL 10 MG/ML IV EMUL
INTRAVENOUS | Status: AC
  Filled 2014-01-13: qty 100

## 2014-01-13 NOTE — Progress Notes (Signed)
Notification of Study Enrollment  This patient has been enrolled in the IRB-approved STOPPIT study, looking at avoiding PPI's when enteral feeds reach at least 30cc/hr.  Please do not place on acid suppression therapy for prophylaxis prior to contacting study team.  Renelda Mom. Jacqlyn Larsen, PharmD Clinical Pharmacist - Resident Pager: [phone] Pharmacy: [phone] 01/13/2014 10:40 AM

## 2014-01-13 NOTE — Progress Notes (Signed)
[facility] ADULT ICU REPLACEMENT PROTOCOL FOR AM LAB REPLACEMENT ONLY  The patient does apply for the [facility] Adult ICU Electrolyte Replacment Protocol based on the criteria listed below:   1. Is GFR >/= 40 ml/min? yes  Patient's GFR today is >90 2. Is urine output >/= 0.5 ml/kg/hr for the last 6 hours? yes Patient's UOP is 1.06 ml/kg/hr 3. Is BUN < 60 mg/dL? yes  Patient's BUN today is 10 4. Abnormal electrolyte(s):K 3.2, Mg 1.6 M5. Ordered repletion with: Per protocol 6. If a panic level lab has been reported, has the CCM MD in charge been notified? yes.   Physician:  Dr Genelle Gather 01/13/2014 5:24 AM

## 2014-01-13 NOTE — Progress Notes (Signed)
eLink Physician-Brief Progress Note Patient Name: Whitney Marks DOB: 07-27-1961 MRN: [id number]  Date of Service  01/13/2014   HPI/Events of Note     eICU Interventions  Resume lyrica, trazodone, valium (home meds) Add propofol, goal RASS 0 to -1   Intervention Category Major Interventions: Delirium, psychosis, severe agitation - evaluation and management  ALVA,RAKESH V. 01/13/2014, 5:50 PM

## 2014-01-13 NOTE — Progress Notes (Addendum)
PULMONARY / CRITICAL CARE MEDICINE   Name: Whitney Marks MRN: [id number] DOB: 1960-11-05    ADMISSION DATE:  01/09/2014  REFERRING MD :  EDP PRIMARY SERVICE: PCCM  CHIEF COMPLAINT:  Dyspnea  BRIEF PATIENT DESCRIPTION: 53 yo with history of tracheostomy, tracheal stenosis previous admissions for aspiration pneumonia, ARDS, sepsis and opioid addiction presented 3/12 with dyspnea, worsening cough and fever.   has a past medical history of Pneumonia; Chronic pain; Arthritis; Difficult intubation; Shortness of breath; Sleep apnea; Cancer of cervix; Recurrent upper respiratory infection (URI); Headache(784.0); Anxiety; Neuromuscular disorder; Fibromyalgia; and Depression.   has past surgical history that includes Neck surgery.  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 On perssors. Septic shock persists. Renal function improving.  01/11/14: Agitated overnight and precedex added. This morning: able to come off fentanyl. Improving pressor need   SUBJECTIVE/OVERNIGHT/INTERVAL HX No pressors, awake  VITAL SIGNS: Temp:  [97.5 F (36.4 C)-99 F (37.2 C)] 97.5 F (36.4 C) (03/16 0913) Pulse Rate:  [38-81] 64 (03/16 0730) Resp:  [11-38] 11 (03/16 0730) BP: (88-150)/(63-110) 88/70 mmHg (03/16 0730) SpO2:  [100 %] 100 % (03/16 0730) FiO2 (%):  [40 %] 40 % (03/16 0852) Weight:  [60.6 kg (133 lb 9.6 oz)] 60.6 kg (133 lb 9.6 oz) (03/16 0316)  HEMODYNAMICS:   VENTILATOR SETTINGS: Vent Mode:  [-] CPAP;PSV FiO2 (%):  [40 %] 40 % Set Rate:  [35 bmp] 35 bmp Vt Set:  [400 mL] 400 mL PEEP:  [5 cmH20] 5 cmH20 Pressure  Support:  [8 [id number] cmH20] 8 cmH20 Plateau Pressure:  [23 cmH20-25 cmH20] 25 cmH20 INTAKE / OUTPUT: Intake/Output     03/15 0701 - 03/16 0700 03/16 0701 - 03/17 0700   I.V. (mL/kg) 1844.6 (30.4) 215.5 (3.6)   NG/GT 920 80   IV Piggyback 500    Total Intake(mL/kg) 3264.6 (53.9) 295.5 (4.9)   Urine (mL/kg/hr) 2090 (1.4) 100 (0.7)   Stool 300 (0.2)    Total Output 2390 100   Net +874.6 +195.5        Stool Occurrence 3 x      PHYSICAL EXAMINATION: General:  Critically ill appearing. Sedated Neuro:  RASS -1, cam Pos HEENT:  ET Ttube +  midline tracheostomy scar Cardiovascular:  Regular, Normal heart sounds Lungs:  Coarse bilateral Abdomen:  Soft, non tender Musculoskeletal: grossly normal Skin:  No rash  LABS: PULMONARY  Recent Labs Lab 01/10/14 0130 01/10/14 1243 01/10/14 1642 01/10/14 2052 01/11/14 0006  PHART 7.185* 7.207* 7.183* 7.256* 7.437  PCO2ART 53.4* 51.4* 52.0* 43.3 35.8  PO2ART 210.0* 118.0* 108.0* 133.0* 94.0  HCO3 19.0* 20.2 19.3* 19.4* 24.3*  TCO2 20.6 22 21 21 25   O2SAT 99.6 97.0 96.0 99.0 98.0    CBC  Recent Labs Lab 01/11/14 0350 01/12/14 0458 01/13/14 0420  HGB 8.8* 9.4* 9.1*  HCT 27.4* 28.7* 27.6*  WBC 4.4 4.9 5.0  PLT 173 139* 114*    COAGULATION  Recent Labs Lab 01/09/14 1745 01/11/14 0350  INR 1.08 1.12    CARDIAC    Recent Labs Lab 01/09/14 1705 01/10/14 0456  TROPONINI <0.30 <0.30    Recent Labs Lab 01/09/14 1705 01/11/14 0350  PROBNP 4590.0* 1067.0*     CHEMISTRY  Recent Labs Lab 01/09/14 1701 01/10/14 0456 01/11/14 0350 01/12/14 0458 01/13/14 0420  NA 139 142 144 140 142  K 4.2 4.6 3.8 3.3* 3.2*  CL 104 110 110 106 106  CO2 16* 19 22 23 26   GLUCOSE 126* 135* 109* 172* 102*  BUN 47* 34* 19 12 10   CREATININE 3.36* 1.84* 1.27* 0.86 0.74  CALCIUM 7.8* 7.6* 7.7* 7.6* 7.7*  MG 2.2  --  1.5 1.5 1.6  PHOS 6.2*  --  1.4* 1.4* 2.3   Estimated Creatinine Clearance: 70.3 ml/min (by C-G formula based  on Cr of 0.74).   LIVER  Recent Labs Lab 01/09/14 1701 01/09/14 1745 01/11/14 0350  AST 31  --   --   ALT 6  --   --   ALKPHOS 83  --   --   BILITOT 0.3  --   --   PROT 7.7  --   --   ALBUMIN 3.0*  --   --   INR  --  1.08 1.12     INFECTIOUS  Recent Labs Lab 01/09/14 2101 01/09/14 2315 01/10/14 0456 01/10/14 0501 01/11/14 0350  LATICACIDVEN  --  0.8  --  0.6  --   PROCALCITON 5.54  --  4.63  --  2.24     ENDOCRINE CBG (last 3)   Recent Labs  01/13/14 0031 01/13/14 0434 01/13/14 0902  GLUCAP 94 124* 112*     IMAGING x48h  Dg Chest Port 1 View  01/13/2014   CLINICAL DATA:  Evaluate endotracheal tube, shortness of breath  EXAM: PORTABLE CHEST - 1 VIEW  COMPARISON:  Prior radiograph from 01/12/2014  FINDINGS: Tip of the endotracheal tube is positioned 2.9 cm above the carina. Enteric tube courses into the abdomen. Left IJ central venous catheter terminates over the mid distal SVC. Mild cardiomegaly is stable.  Lungs remain mildly hypoinflated. Patchy bilateral lower lobe airspace opacities are not significantly changed. No pulmonary edema. There is probable small right pleural effusion. No pneumothorax.  Osseous structures are unchanged.  IMPRESSION: 1. Tip of the endotracheal tube 2.9 cm above the carina. 2. No significant interval change in bibasilar airspace disease, right greater than left. 3. Probable small right pleural effusion.   Electronically Signed   By: Jeannine Boga M.D.   On: 01/13/2014 06:18   Dg Chest Port 1 View  01/12/2014   CLINICAL DATA:  Endotracheal tube placement  EXAM: PORTABLE CHEST - 1 VIEW  COMPARISON:  DG CHEST 1V PORT dated 01/10/2014  FINDINGS: Endotracheal tube remains appropriately positioned. Nasogastric tube tip terminates below the level of the hemidiaphragms but is not included in the field of view. Support apparatus overlies the chest. Patchy bilateral lower lobe predominant airspace opacities are reidentified. Mild  cardiomegaly noted. Patient is rotated to the right. Left IJ central line tip terminates over the distal SVC.  IMPRESSION: No significant change as above, endotracheal tube appropriately positioned with stable bibasilar predominant airspace disease.   Electronically Signed   By: Conchita Paris M.D.   On: 01/12/2014 09:01    pcxr - remaining bilat infiltrates resolving, ett    ASSESSMENT / PLAN:  PULMONARY A: Acute respiratory failure s/p intubation 312!5 Aspiration pneumonia History of tracheostomy P:   Weaning this am ps 14 now down to 8, goal to 5, cpap5 ,g oal 4 hr -pcxr in am  -  neg balance goals -last abg reviewed, reduce rate, repeat abg -keep dry -consider bronch assessment stenosis etc  CARDIOVASCULAR A: Septic shock resolved P:  Septic shock protocol off Tele consider cvp 4 , factt  RENAL A: Acute renal failure - resolved ARDS Hypokalemia hypomagenesemia  P:   Start lasix kvo k supp   GASTROINTESTINAL A: Suspect dysphagia and chronic aspiration  P:   Tube feeds per nutrition PPI lft in am   HEMATOLOGIC A:   Mild anemia VTE Px Thrombocytopenia, dilution? Sepsis? P:  Trend CBC Heparin for prophylaxis, dc PRBC for hgb < 7gm% scd Follow trend plat in am  coags in case needs trach  INFECTIOUS A: Aspiration pneumonia with possible ALI, likely strep is pathogen P:   Continue vanc until strep r/o as res ceph Continue levo Dc zosyn  ENDOCRINE A: No active issues P:   No intervention required  NEUROLOGIC A: Acute encephalopathy Suspect opioid addiction / dependence : U tolx positive for benzo and opiopids. ETOH negative, ASA level and tylenol negative  - agiated encephalopathy severe  P:   Restart fent gtt, WUA Use fent prn, versed prn precedex dc not effective Hold Valium, Percocet, Opana, Lyrica, Trazodone; might need gentle restart if agitation persists  will plan meeting with husband n in am ie? trach  I have personally  obtained history, examined patient, evaluated and interpreted laboratory and imaging results, reviewed medical records, formulated assessment / plan and placed orders.  CRITICAL CARE:  The patient is critically ill with multiple organ systems failure and requires high complexity decision making for assessment and support, frequent evaluation and titration of therapies, application of advanced monitoring technologies and extensive interpretation of multiple databases. Critical Care Time devoted to patient care services described in this note is 35 minutes.   [name]. Titus Mould, MD, [place] Pgr: [place] [facility]

## 2014-01-14 ENCOUNTER — Inpatient Hospital Stay (HOSPITAL_COMMUNITY): Payer: Medicare Other

## 2014-01-14 LAB — BLOOD GAS, ARTERIAL
Acid-Base Excess: 8.2 mmol/L — ABNORMAL HIGH (ref 0.0–2.0)
Bicarbonate: 32.8 mEq/L — ABNORMAL HIGH (ref 20.0–24.0)
DRAWN BY: 39866
FIO2: 0.4 %
LHR: 24 {breaths}/min
O2 SAT: 97.7 %
PEEP: 5 cmH2O
Patient temperature: 98.4
TCO2: 34.4 mmol/L (ref 0–100)
VT: 340 mL
pCO2 arterial: 50.9 mmHg — ABNORMAL HIGH (ref 35.0–45.0)
pH, Arterial: 7.424 (ref 7.350–7.450)
pO2, Arterial: 95.7 mmHg (ref 80.0–100.0)

## 2014-01-14 LAB — BENZODIAZEPINE, QUANTITATIVE, URINE
ALPRAZOLAMU: NEGATIVE ng/mL
Alprazolam (GC/LC/MS), ur confirm: NEGATIVE ng/mL
Clonazepam metabolite (GC/LC/MS), ur confirm: NEGATIVE ng/mL
Diazepam (GC/LC/MS), ur confirm: NEGATIVE ng/mL
Estazolam (GC/LC/MS), ur confirm: NEGATIVE ng/mL
FLURAZEPAM GC/MS CONF: NEGATIVE ng/mL
Flunitrazepam metabolite (GC/LC/MS), ur confirm: NEGATIVE ng/mL
LORAZEPAMU: NEGATIVE ng/mL
Midazolam (GC/LC/MS), ur confirm: NEGATIVE ng/mL
Nordiazepam GC/MS Conf: 132 ng/mL
OXAZEPAM GC/MS CONF: 1673 ng/mL
TEMAZEPAM GC/MS CONF: 2212 ng/mL
Triazolam metabolite (GC/LC/MS), ur confirm: NEGATIVE ng/mL

## 2014-01-14 LAB — PHOSPHORUS: Phosphorus: 4.1 mg/dL (ref 2.3–4.6)

## 2014-01-14 LAB — CBC WITH DIFFERENTIAL/PLATELET
Basophils Absolute: 0 10*3/uL (ref 0.0–0.1)
Basophils Relative: 0 % (ref 0–1)
EOS ABS: 0.3 10*3/uL (ref 0.0–0.7)
Eosinophils Relative: 6 % — ABNORMAL HIGH (ref 0–5)
HCT: 28.1 % — ABNORMAL LOW (ref 36.0–46.0)
HEMOGLOBIN: 9.1 g/dL — AB (ref 12.0–15.0)
LYMPHS ABS: 1.9 10*3/uL (ref 0.7–4.0)
LYMPHS PCT: 43 % (ref 12–46)
MCH: 29.6 pg (ref 26.0–34.0)
MCHC: 32.4 g/dL (ref 30.0–36.0)
MCV: 91.5 fL (ref 78.0–100.0)
MONOS PCT: 13 % — AB (ref 3–12)
Monocytes Absolute: 0.6 10*3/uL (ref 0.1–1.0)
NEUTROS PCT: 38 % — AB (ref 43–77)
Neutro Abs: 1.7 10*3/uL (ref 1.7–7.7)
PLATELETS: 179 10*3/uL (ref 150–400)
RBC: 3.07 MIL/uL — AB (ref 3.87–5.11)
RDW: 15.4 % (ref 11.5–15.5)
WBC: 4.5 10*3/uL (ref 4.0–10.5)

## 2014-01-14 LAB — COMPREHENSIVE METABOLIC PANEL
ALBUMIN: 2.2 g/dL — AB (ref 3.5–5.2)
ALK PHOS: 62 U/L (ref 39–117)
ALT: 5 U/L (ref 0–35)
AST: 12 U/L (ref 0–37)
BUN: 11 mg/dL (ref 6–23)
CO2: 32 mEq/L (ref 19–32)
Calcium: 8.2 mg/dL — ABNORMAL LOW (ref 8.4–10.5)
Chloride: 103 mEq/L (ref 96–112)
Creatinine, Ser: 0.87 mg/dL (ref 0.50–1.10)
GFR calc non Af Amer: 75 mL/min — ABNORMAL LOW (ref >90)
GFR, EST AFRICAN AMERICAN: 87 mL/min — AB (ref >90)
GLUCOSE: 96 mg/dL (ref 70–99)
POTASSIUM: 3.7 meq/L (ref 3.7–5.3)
Sodium: 145 mEq/L (ref 137–147)
TOTAL PROTEIN: 6.5 g/dL (ref 6.0–8.3)
Total Bilirubin: 0.2 mg/dL — ABNORMAL LOW (ref 0.3–1.2)

## 2014-01-14 LAB — RESPIRATORY VIRUS PANEL
Adenovirus: NOT DETECTED
INFLUENZA A H1: NOT DETECTED
INFLUENZA A H3: NOT DETECTED
INFLUENZA A: NOT DETECTED
INFLUENZA B 1: NOT DETECTED
Metapneumovirus: DETECTED — AB
Parainfluenza 1: NOT DETECTED
Parainfluenza 2: NOT DETECTED
Parainfluenza 3: NOT DETECTED
RESPIRATORY SYNCYTIAL VIRUS A: NOT DETECTED
Respiratory Syncytial Virus B: NOT DETECTED
Rhinovirus: NOT DETECTED

## 2014-01-14 LAB — OPIATE, QUANTITATIVE, URINE
6 Monoacetylmorphine, Ur-Confirm: NEGATIVE ng/mL
Codeine Urine: NEGATIVE ng/mL
Hydrocodone: NEGATIVE ng/mL
Hydromorphone GC/MS Conf: NEGATIVE ng/mL
Morphine, Confirm: 1872 ng/mL
NOROXYCODONE, UR: 1908 ng/mL
Norhydrocodone, Ur: NEGATIVE ng/mL
OXYCODONE, UR: 1819 ng/mL
OXYMORPHONE: 627 ng/mL

## 2014-01-14 LAB — MAGNESIUM: Magnesium: 2 mg/dL (ref 1.5–2.5)

## 2014-01-14 LAB — GLUCOSE, CAPILLARY
GLUCOSE-CAPILLARY: 119 mg/dL — AB (ref 70–99)
Glucose-Capillary: 95 mg/dL (ref 70–99)
Glucose-Capillary: 110 mg/dL — ABNORMAL HIGH (ref 70–99)
Glucose-Capillary: 106 mg/dL — ABNORMAL HIGH (ref 70–99)
Glucose-Capillary: 104 mg/dL — ABNORMAL HIGH (ref 70–99)

## 2014-01-14 MED ORDER — FUROSEMIDE 10 MG/ML IJ SOLN
20.0000 mg | Freq: Two times a day (BID) | INTRAMUSCULAR | Status: DC
  Administered 2014-01-14 – 2014-01-16 (×5): 20 mg via INTRAVENOUS
  Filled 2014-01-14 (×4): qty 2

## 2014-01-14 MED ORDER — ETOMIDATE 2 MG/ML IV SOLN
20.0000 mg | Freq: Once | INTRAVENOUS | Status: AC
  Administered 2014-01-14: 20 mg via INTRAVENOUS

## 2014-01-14 MED ORDER — FUROSEMIDE 10 MG/ML IJ SOLN
20.0000 mg | Freq: Two times a day (BID) | INTRAMUSCULAR | Status: DC
  Filled 2014-01-14: qty 2

## 2014-01-14 MED ORDER — VECURONIUM BROMIDE 10 MG IV SOLR
INTRAVENOUS | Status: AC
Start: 2014-01-14 — End: 2014-01-15
  Filled 2014-01-14: qty 10

## 2014-01-14 MED ORDER — POTASSIUM CHLORIDE 20 MEQ/15ML (10%) PO LIQD
20.0000 meq | ORAL | Status: AC
  Administered 2014-01-14 (×2): 20 meq
  Filled 2014-01-14 (×2): qty 15

## 2014-01-14 MED ORDER — ETOMIDATE 2 MG/ML IV SOLN
INTRAVENOUS | Status: AC
  Administered 2014-01-14: 20 mg via INTRAVENOUS
  Filled 2014-01-14: qty 10

## 2014-01-14 NOTE — Procedures (Signed)
Bronchoscopy Procedure Note Whitney Marks [id number] 09/17/1961  Procedure: Bronchoscopy Indications: Diagnostic evaluation of the airways  Procedure Details Consent: Risks of procedure as well as the alternatives and risks of each were explained to the (patient/caregiver).  Consent for procedure obtained. Time Out: Verified patient identification, verified procedure, site/side was marked, verified correct patient position, special equipment/implants available, medications/allergies/relevent history reviewed, required imaging and test results available.  Performed  In preparation for procedure, patient was given 100% FiO2 and bronchoscope lubricated. Sedation: Etomidate  Airway entered and the following bronchi were examined: Bronchi.  Placed broncvh through ETT and backed it up to 17.5 cm. Never lost airway. No sig stenosis noted at these levels. Some airway edema mild. Cant assess airway stenosis above this level. A lot of secretions clear suctioned Bronchoscope removed.  , Patient placed back on 100% FiO2 at conclusion of procedure.    Evaluation Hemodynamic Status: BP stable throughout; O2 sats: stable throughout Patient's Current Condition: stable Specimens:  None Complications: No apparent complications Patient did tolerate procedure well.   Raylene Miyamoto 01/14/2014

## 2014-01-14 NOTE — Progress Notes (Addendum)
PULMONARY / CRITICAL CARE MEDICINE   Name: Whitney Marks MRN: [id number] DOB: 05-24-1961    ADMISSION DATE:  01/09/2014  REFERRING MD :  EDP PRIMARY SERVICE: PCCM  CHIEF COMPLAINT:  Dyspnea  BRIEF PATIENT DESCRIPTION: 53 yo with history of tracheostomy, tracheal stenosis previous admissions for aspiration pneumonia, ARDS, sepsis and opioid addiction presented 3/12 with dyspnea, worsening cough and fever.   has a past medical history of Pneumonia; Chronic pain; Arthritis; Difficult intubation; Shortness of breath; Sleep apnea; Cancer of cervix; Recurrent upper respiratory infection (URI); Headache(784.0); Anxiety; Neuromuscular disorder; Fibromyalgia; and Depression.   has past surgical history that includes Neck surgery.  SIGNIFICANT EVENTS / STUDIES:  3/12 ETT placed 3/16- off pressors  LINES / TUBES: ETT 01/09/14 >> CVL 01/09/14 R IJ Aline out  CULTURES: 3/12  Blood >>> 3/12  Urine >> 3/12  Sputum >>>strep pna 01/09/14 MRSA PCr - POSITIVE 3/12  BAL - strep, H flu .Marland Kitchen 3/15 - tracheal aspireate resp virus multiplex >>  ANTIBIOTICS: Vancomyicn 3/12 >>>3/16 Zosyn 3/12 >>>3/16 Levaquin 3/12 >>>stop 3/18   EVENTS Neg 2.2 liters  SUBJECTIVE/OVERNIGHT/INTERVAL HX Awake, propofol required  VITAL SIGNS: Temp:  [98.1 F (36.7 C)-98.4 F (36.9 C)] 98.1 F (36.7 C) (03/17 0841) Pulse Rate:  [48-108] 66 (03/17 0900) Resp:  [10-28] 24 (03/17 0900) BP: (79-146)/(37-97) 85/41 mmHg (03/17 0900) SpO2:  [97 %-100 %] 99 % (03/17 0900) FiO2 (%):  [40 %] 40 % (03/17 0800) Weight:  [57 kg (125 lb 10.6 oz)] 57 kg (125 lb 10.6 oz) (03/17 0338)  HEMODYNAMICS:   VENTILATOR SETTINGS: Vent Mode:  [-] PRVC FiO2 (%):  [40 %] 40 % Set Rate:  [24 bmp] 24 bmp Vt Set:  [340 mL] 340 mL PEEP:  [5 cmH20] 5 cmH20 Pressure Support:  [8 cmH20] 8 cmH20 Plateau Pressure:  [15 cmH20-22 cmH20] 22 cmH20 INTAKE / OUTPUT: Intake/Output     03/16 0701 - 03/17 0700 03/17 0701 - 03/18 0700   I.V. (mL/kg) 1217.2 (21.4) 87.2 (1.5)   NG/GT 960 80   IV Piggyback 150    Total Intake(mL/kg) 2327.2 (40.8) 167.2 (2.9)   Urine (mL/kg/hr) 4580 (3.3) 70 (0.3)   Stool 200 (0.1)    Total Output 4780 70   Net -2452.8 +97.2          PHYSICAL EXAMINATION: General:  Critically ill appearing. Sedated Neuro:  RASS -2 HEENT:  ET Ttube +  midline tracheostomy scar Cardiovascular:  s1 s2 RRR Lungs:  Coarse bilateral improved Abdomen:  Soft, non tender Musculoskeletal: grossly normal Skin:  No rash  LABS: PULMONARY  Recent Labs Lab 01/10/14 1642 01/10/14 2052 01/11/14 0006 01/13/14 1241 01/14/14 0430  PHART 7.183* 7.256* 7.437 7.447 7.424  PCO2ART 52.0* 43.3 35.8 44.7 50.9*  PO2ART 108.0* 133.0* 94.0 91.0 95.7  HCO3 19.3* 19.4* 24.3* 30.9* 32.8*  TCO2 21 21 25  32 34.4  O2SAT 96.0 99.0 98.0 97.0 97.7    CBC  Recent Labs Lab 01/12/14 0458 01/13/14 0420 01/14/14 0345  HGB 9.4* 9.1* 9.1*  HCT 28.7* 27.6* 28.1*  WBC 4.9 5.0 4.5  PLT 139* 114* 179    COAGULATION  Recent Labs Lab 01/09/14 1745 01/11/14 0350 01/13/14 1000  INR 1.08 1.12 1.10    CARDIAC    Recent Labs Lab 01/09/14 1705 01/10/14 0456  TROPONINI <0.30 <0.30    Recent Labs Lab 01/09/14 1705 01/11/14 0350  PROBNP 4590.0* 1067.0*     CHEMISTRY  Recent Labs Lab 01/09/14 1701 01/10/14 0456 01/11/14 0350  01/12/14 0458 01/13/14 0420 01/14/14 0345  NA 139 142 144 140 142 145  K 4.2 4.6 3.8 3.3* 3.2* 3.7  CL 104 110 110 106 106 103  CO2 16* 19 22 23 26  32  GLUCOSE 126* 135* 109* 172* 102* 96  BUN 47* 34* 19 12 10 11   CREATININE 3.36* 1.84* 1.27* 0.86 0.74 0.87  CALCIUM 7.8* 7.6* 7.7* 7.6* 7.7* 8.2*  MG 2.2  --  1.5 1.5 1.6 2.0  PHOS 6.2*  --  1.4* 1.4* 2.3 4.1   Estimated Creatinine Clearance: 59.3 ml/min (by C-G formula based on Cr of 0.87).   LIVER  Recent Labs Lab 01/09/14 1701 01/09/14 1745 01/11/14 0350 01/13/14 1000 01/14/14 0345  AST 31  --   --   --  12  ALT  6  --   --   --  5  ALKPHOS 83  --   --   --  62  BILITOT 0.3  --   --   --  0.2*  PROT 7.7  --   --   --  6.5  ALBUMIN 3.0*  --   --   --  2.2*  INR  --  1.08 1.12 1.10  --      INFECTIOUS  Recent Labs Lab 01/09/14 2101 01/09/14 2315 01/10/14 0456 01/10/14 0501 01/11/14 0350  LATICACIDVEN  --  0.8  --  0.6  --   PROCALCITON 5.54  --  4.63  --  2.24     ENDOCRINE CBG (last 3)   Recent Labs  01/13/14 1907 01/14/14 0336 01/14/14 0740  GLUCAP 111* 95 110*     IMAGING x48h  Dg Chest Port 1 View  01/14/2014   CLINICAL DATA:  Check endotracheal tube position.  EXAM: PORTABLE CHEST - 1 VIEW  COMPARISON:  01/13/2014 and earlier priors  FINDINGS: Endotracheal tube is 2.6 cm above the carina. Left IJ central venous catheter terminates in the distal superior vena cava, in stable position. Nasogastric tube courses into the stomach in continues below the edge of the image.  The heart size appears normal. Mediastinal and hilar contours are normal. The knee pulmonary vascularity appears congested. There is diffuse interstitial prominence and there are persistent bibasilar airspace opacities. No significant change in aeration of the lungs. No visible pleural effusion. Negative for pneumothorax.  IMPRESSION: 1. No change in aeration of the lungs. Persistent diffuse interstitial prominence and bibasilar airspace opacities. 2. Endotracheal tube is 2.6 cm above the carina.   Electronically Signed   By: Curlene Dolphin M.D.   On: 01/14/2014 08:23   Dg Chest Port 1 View  01/13/2014   CLINICAL DATA:  Evaluate endotracheal tube, shortness of breath  EXAM: PORTABLE CHEST - 1 VIEW  COMPARISON:  Prior radiograph from 01/12/2014  FINDINGS: Tip of the endotracheal tube is positioned 2.9 cm above the carina. Enteric tube courses into the abdomen. Left IJ central venous catheter terminates over the mid distal SVC. Mild cardiomegaly is stable.  Lungs remain mildly hypoinflated. Patchy bilateral lower lobe  airspace opacities are not significantly changed. No pulmonary edema. There is probable small right pleural effusion. No pneumothorax.  Osseous structures are unchanged.  IMPRESSION: 1. Tip of the endotracheal tube 2.9 cm above the carina. 2. No significant interval change in bibasilar airspace disease, right greater than left. 3. Probable small right pleural effusion.   Electronically Signed   By: Jeannine Boga M.D.   On: 01/13/2014 06:18   Dg Abd Portable 1v  01/13/2014   CLINICAL DATA:  NG tube placement.  EXAM: PORTABLE ABDOMEN - 1 VIEW  COMPARISON:  None.  FINDINGS: NG tube tip is in the region of the body of the stomach with the side port in the proximal stomach. Nonobstructive bowel gas pattern. No free air organomegaly.  IMPRESSION: NG tube tip in the body of the stomach.   Electronically Signed   By: Rolm Baptise M.D.   On: 01/13/2014 12:32    pcxr - remaining bilat infiltrates resolving, ett    ASSESSMENT / PLAN:  PULMONARY A: Acute respiratory failure s/p intubation 312!5 Aspiration pneumonia History of tracheostomy P:   Weaning this am PS 5 cpap5, goal 2 hrs -pcxr in am  -neg balance goals remain, see renal -abg reviewed, reduce rate -plan bronch assessment stenosis today  CARDIOVASCULAR A: Septic shock resolved P:  Tele Tolerating propofol  RENAL A: Acute renal failure - resolved ARDS  P:   Maintain lasix, reduce kvo  GASTROINTESTINAL A: Suspect dysphagia and chronic aspiration  P:   Tube feeds per nutrition, hold for bronch PPI lft wnl BM noted  HEMATOLOGIC A:   Mild anemia VTE Px Thrombocytopenia, dilution? Sepsis? Improved - hemoconcentration well P:  Trend CBC Heparin for prophylaxis,restart in am if plat wnl scd Follow trend plat in am  coags in case needs trach - wnl  INFECTIOUS A: Aspiration pneumonia with possible ALI, likely strep is pathogen P:   Establish stop date levofloxacin  ENDOCRINE A: No active issues P:   No  intervention required  NEUROLOGIC A: Acute encephalopathy Suspect opioid addiction / dependence : U tolx positive for benzo and opiopids. ETOH negative, ASA level and tylenol negative  - agiated encephalopathy severe  P:   Restart fent gtt, WUA Use fent prn, versed prn Hold Valium, Percocet, Opana, Lyrica Restart Trazodone; might need gentle restart others if agitation persists  Global: bronch assess stenosis if able, wean, lasix   I have personally obtained history, examined patient, evaluated and interpreted laboratory and imaging results, reviewed medical records, formulated assessment / plan and placed orders.  CRITICAL CARE:  The patient is critically ill with multiple organ systems failure and requires high complexity decision making for assessment and support, frequent evaluation and titration of therapies, application of advanced monitoring technologies and extensive interpretation of multiple databases. Critical Care Time devoted to patient care services described in this note is 30 minutes.   [name]. Titus Mould, MD, [place] Pgr: [place] [facility] [facility]

## 2014-01-14 NOTE — Progress Notes (Addendum)
Chaplain went to see pt as per consult.  Pt was unavailable and there was no family present.  Chaplain will follow up later in the day.   Chaplain was making rounds later in the day when pt had mother and family pastor present.  Since there is pastoral representation that the family prefers, chaplain assessed that chaplain spiritual support is not necessary at this time.  Chaplain serves will be available if needed in the future.

## 2014-01-14 NOTE — Progress Notes (Signed)
[facility] ADULT ICU REPLACEMENT PROTOCOL FOR AM LAB REPLACEMENT ONLY  The patient does apply for the [facility] Adult ICU Electrolyte Replacment Protocol based on the criteria listed below:   1. Is GFR >/= 40 ml/min? yes  Patient's GFR today is 75 2. Is urine output >/= 0.5 ml/kg/hr for the last 6 hours? yes Patient's UOP is 1.63 ml/kg/hr 3. Is BUN < 60 mg/dL? yes  Patient's BUN today is 11 4. Abnormal electrolyte(s): k 3.7 5. Ordered repletion with: per protocol 6. If a panic level lab has been reported, has the CCM MD in charge been notified? yes.   Physician:  [place], [place] 01/14/2014 6:41 AM

## 2014-01-15 ENCOUNTER — Inpatient Hospital Stay (HOSPITAL_COMMUNITY): Payer: Medicare Other

## 2014-01-15 DIAGNOSIS — R0602 Shortness of breath: Secondary | ICD-10-CM

## 2014-01-15 DIAGNOSIS — N179 Acute kidney failure, unspecified: Secondary | ICD-10-CM

## 2014-01-15 LAB — CBC WITH DIFFERENTIAL/PLATELET
Basophils Absolute: 0 10*3/uL (ref 0.0–0.1)
Basophils Relative: 0 % (ref 0–1)
EOS PCT: 9 % — AB (ref 0–5)
Eosinophils Absolute: 0.6 10*3/uL (ref 0.0–0.7)
HCT: 27.9 % — ABNORMAL LOW (ref 36.0–46.0)
Hemoglobin: 9.1 g/dL — ABNORMAL LOW (ref 12.0–15.0)
LYMPHS PCT: 31 % (ref 12–46)
Lymphs Abs: 2 10*3/uL (ref 0.7–4.0)
MCH: 30.3 pg (ref 26.0–34.0)
MCHC: 32.6 g/dL (ref 30.0–36.0)
MCV: 93 fL (ref 78.0–100.0)
Monocytes Absolute: 0.7 10*3/uL (ref 0.1–1.0)
Monocytes Relative: 11 % (ref 3–12)
NEUTROS ABS: 3 10*3/uL (ref 1.7–7.7)
Neutrophils Relative %: 48 % (ref 43–77)
PLATELETS: 266 10*3/uL (ref 150–400)
RBC: 3 MIL/uL — ABNORMAL LOW (ref 3.87–5.11)
RDW: 15.2 % (ref 11.5–15.5)
WBC: 6.3 10*3/uL (ref 4.0–10.5)

## 2014-01-15 LAB — GLUCOSE, CAPILLARY
GLUCOSE-CAPILLARY: 110 mg/dL — AB (ref 70–99)
GLUCOSE-CAPILLARY: 113 mg/dL — AB (ref 70–99)
Glucose-Capillary: 111 mg/dL — ABNORMAL HIGH (ref 70–99)
Glucose-Capillary: 120 mg/dL — ABNORMAL HIGH (ref 70–99)
Glucose-Capillary: 102 mg/dL — ABNORMAL HIGH (ref 70–99)
Glucose-Capillary: 91 mg/dL (ref 70–99)

## 2014-01-15 LAB — CULTURE, BLOOD (ROUTINE X 2): Culture: NO GROWTH

## 2014-01-15 LAB — BASIC METABOLIC PANEL
BUN: 14 mg/dL (ref 6–23)
CALCIUM: 8.8 mg/dL (ref 8.4–10.5)
CO2: 35 mEq/L — ABNORMAL HIGH (ref 19–32)
CREATININE: 0.91 mg/dL (ref 0.50–1.10)
Chloride: 100 mEq/L (ref 96–112)
GFR calc non Af Amer: 71 mL/min — ABNORMAL LOW (ref >90)
GFR, EST AFRICAN AMERICAN: 83 mL/min — AB (ref >90)
Glucose, Bld: 113 mg/dL — ABNORMAL HIGH (ref 70–99)
Potassium: 3.8 mEq/L (ref 3.7–5.3)
Sodium: 143 mEq/L (ref 137–147)

## 2014-01-15 LAB — MAGNESIUM: MAGNESIUM: 1.7 mg/dL (ref 1.5–2.5)

## 2014-01-15 LAB — PHOSPHORUS: Phosphorus: 3.3 mg/dL (ref 2.3–4.6)

## 2014-01-15 MED ORDER — FENTANYL CITRATE 0.05 MG/ML IJ SOLN
100.0000 ug | Freq: Once | INTRAMUSCULAR | Status: AC
  Administered 2014-01-15: 100 ug via INTRAVENOUS

## 2014-01-15 MED ORDER — FENTANYL CITRATE 0.05 MG/ML IJ SOLN: 25.0000 ug | INTRAMUSCULAR | Status: AC | PRN

## 2014-01-15 MED ORDER — ROCURONIUM BROMIDE 50 MG/5ML IV SOLN
30.0000 mg | Freq: Once | INTRAVENOUS | Status: AC
  Administered 2014-01-15: 30 mg via INTRAVENOUS

## 2014-01-15 MED ORDER — PRO-STAT SUGAR FREE PO LIQD
30.0000 mL | Freq: Two times a day (BID) | ORAL | Status: DC
  Filled 2014-01-15 (×3): qty 30

## 2014-01-15 MED ORDER — ETOMIDATE 2 MG/ML IV SOLN
20.0000 mg | Freq: Once | INTRAVENOUS | Status: AC
  Administered 2014-01-15: 20 mg via INTRAVENOUS

## 2014-01-15 MED ORDER — OXEPA PO LIQD
1000.0000 mL | ORAL | Status: DC
Start: 2014-01-15 — End: 2014-01-16
  Filled 2014-01-15 (×3): qty 1000

## 2014-01-15 MED ORDER — MAGNESIUM SULFATE 50 % IJ SOLN
2.0000 g | Freq: Once | INTRAVENOUS | Status: DC
  Filled 2014-01-15: qty 4

## 2014-01-15 MED ORDER — MIDAZOLAM HCL 2 MG/2ML IJ SOLN
2.0000 mg | Freq: Once | INTRAMUSCULAR | Status: AC
  Administered 2014-01-15: 2 mg via INTRAVENOUS

## 2014-01-15 MED ORDER — MAGNESIUM SULFATE 40 MG/ML IJ SOLN
2.0000 g | Freq: Once | INTRAMUSCULAR | Status: AC
  Administered 2014-01-15: 2 g via INTRAVENOUS
  Filled 2014-01-15: qty 50

## 2014-01-15 MED ORDER — HEPARIN SODIUM (PORCINE) 5000 UNIT/ML IJ SOLN
5000.0000 [IU] | Freq: Three times a day (TID) | INTRAMUSCULAR | Status: DC
  Administered 2014-01-15 – 2014-01-16 (×3): 5000 [IU] via SUBCUTANEOUS
  Filled 2014-01-15 (×6): qty 1

## 2014-01-15 NOTE — Progress Notes (Signed)
Respiratory therapy note-Patient was extubated and started having increased stridor require re intubation emergently with Dr. Titus Mould and then was trached with #6.0 shiley. Placed back on previous ventilator settings.

## 2014-01-15 NOTE — Procedures (Signed)
Bronchoscopy Procedure Note KEYONNI PERCIVAL [id number] 1961-06-27  Procedure: Bronchoscopy Indications: Diagnostic evaluation of the airways  Procedure Details Consent: Unable to obtain consent because of emergent medical necessity. Time Out: Verified patient identification, verified procedure, site/side was marked, verified correct patient position, special equipment/implants available, medications/allergies/relevent history reviewed, required imaging and test results available.  Performed  In preparation for procedure, patient was given 100% FiO2 and bronchoscope lubricated. Sedation: Benzodiazepines, Muscle relaxants and Fentanyl  Airway entered and the following bronchi were examined: RUL, RML, RLL, LUL, LLL and Bronchi.   Bronchoscope removed.    Evaluation Hemodynamic Status: BP stable throughout; O2 sats: stable throughout Patient's Current Condition: stable Specimens:  None Complications: No apparent complications Patient did tolerate procedure well.   Jennet Maduro 01/15/2014

## 2014-01-15 NOTE — Procedures (Signed)
Emergent tracheostomy See full dictation Of details Size 6 placed Blood loss less 10 cc  [name]. Titus Mould, MD, [place] Pgr: [place] [facility]

## 2014-01-15 NOTE — Procedures (Signed)
Intubation Procedure Note AVEYA BEAL [id number] 1961-02-12  Procedure: Intubation Indications: Respiratory insufficiency, near arrest post extubation  Procedure Details Consent: Unable to obtain consent because of emergent medical necessity. Time Out: Verified patient identification, verified procedure, site/side was marked, verified correct patient position, special equipment/implants available, medications/allergies/relevent history reviewed, required imaging and test results available.  Performed  Maximum sterile technique was used including gloves, gown, hand hygiene and mask.  MAC and 3    Evaluation Hemodynamic Status: BP stable throughout; O2 sats: stable throughout Patient's Current Condition: unstable Complications: No apparent complications Patient did tolerate procedure well. Chest X-ray ordered to verify placement.  CXR: pending.   Raylene Miyamoto 01/15/2014  Post extubation, stridor, rapid desaturation, nearing arrest ett placed 7 but unable pass ETT 7 past cords but about 1 cm Able to bag throughout easily, cap pos, equal BS, sat's up from 67% to 100% Unstable airway, will proceed to emergent trach through old site  [name]. Titus Mould, MD, [place] Pgr: [place]

## 2014-01-15 NOTE — Progress Notes (Signed)
72fr. MG tube attempted by three RN's. Tube will pass nasal area then meets resistance. Will hold tube feeds until tomorrow and retry with panda tube. Patient cooperative and calm during procedure. Tolerated well.   Will continue to monitor.   Wayland Denis RN

## 2014-01-15 NOTE — Progress Notes (Signed)
Echocardiogram 2D Echocardiogram has been performed.  Kamel, Shanessa Hodak 01/15/2014, 2:29 PM

## 2014-01-15 NOTE — Procedures (Signed)
Extubation Procedure Note  Patient Details:   Name: Whitney Marks DOB: [birth year] MRN: [id number]   Airway Documentation:     Evaluation  O2 sats: stable throughout Complications: No apparent complications Patient did tolerate procedure well. Bilateral Breath Sounds: Clear;Diminished Suctioning: Airway Yes Placed on 4l/min , raspy voice post extubation Dr. Titus Mould at bedside   Revonda Standard 01/15/2014, 9:50 AM

## 2014-01-15 NOTE — Progress Notes (Signed)
SLP Cancellation Note  Patient Details Name: ADALY PUDER MRN: [id number] DOB: 1961/08/07   Cancelled eval:     Pt trached this am; remains on vent.  Orders received for Passy-Muir and swallow evals.  Will follow for readiness.        Juan Quam Laurice 01/15/2014, 1:42 PM

## 2014-01-15 NOTE — Progress Notes (Signed)
PULMONARY / CRITICAL CARE MEDICINE   Name: Whitney Marks MRN: [id number] DOB: 1961/01/29    ADMISSION DATE:  01/09/2014  REFERRING MD :  EDP PRIMARY SERVICE: PCCM  CHIEF COMPLAINT:  Dyspnea  BRIEF PATIENT DESCRIPTION: 53 yo with history of tracheostomy, tracheal stenosis previous admissions for aspiration pneumonia, ARDS, sepsis and opioid addiction presented 3/12 with dyspnea, worsening cough and fever.   has a past medical history of Pneumonia; Chronic pain; Arthritis; Difficult intubation; Shortness of breath; Sleep apnea; Cancer of cervix; Recurrent upper respiratory infection (URI); Headache(784.0); Anxiety; Neuromuscular disorder; Fibromyalgia; and Depression.   has past surgical history that includes Neck surgery.  SIGNIFICANT EVENTS / STUDIES:  3/12 ETT placed 3/16- off pressors 3/17- bronch images without sig stenosis  LINES / TUBES: ETT 01/09/14 >> CVL 01/09/14 R IJ Aline out  CULTURES: 3/12  Blood >>> 3/12  Urine >> 3/12  Sputum >>>strep pna 01/09/14 MRSA PCr - POSITIVE 3/12  BAL - strep, H flu .Marland Kitchen 3/15 - tracheal aspireate resp virus multiplex >>Meta pneumoviris  ANTIBIOTICS: Vancomyicn 3/12 >>>3/16 Zosyn 3/12 >>>3/16 Levaquin 3/12 >>>stop 3/18  EVENTS bronch  SUBJECTIVE/OVERNIGHT/INTERVAL HX Awake  VITAL SIGNS: Temp:  [98.5 F (36.9 C)-99.8 F (37.7 C)] 99.3 F (37.4 C) (03/18 0855) Pulse Rate:  [61-98] 85 (03/18 0800) Resp:  [12-25] 19 (03/18 0800) BP: (82-149)/(39-138) 97/52 mmHg (03/18 0800) SpO2:  [97 %-100 %] 99 % (03/18 0800) FiO2 (%):  [40 %] 40 % (03/18 0800) Weight:  [55.8 kg (123 lb 0.3 oz)] 55.8 kg (123 lb 0.3 oz) (03/18 0457)  HEMODYNAMICS:   VENTILATOR SETTINGS: Vent Mode:  [-] PSV;CPAP FiO2 (%):  [40 %] 40 % Set Rate:  [24 bmp] 24 bmp PEEP:  [5 cmH20] 5 cmH20 Pressure Support:  [5 cmH20] 5 cmH20 Plateau Pressure:  [17 cmH20-20 cmH20] 17 cmH20 INTAKE / OUTPUT: Intake/Output     03/17 0701 - 03/18 0700 03/18 0701 - 03/19  0700   I.V. (mL/kg) 806.4 (14.5) 29.3 (0.5)   NG/GT 990 70   IV Piggyback 150    Total Intake(mL/kg) 1946.4 (34.9) 99.3 (1.8)   Urine (mL/kg/hr) 2395 (1.8) 125 (0.9)   Stool 500 (0.4)    Total Output 2895 125   Net -948.6 -25.7          PHYSICAL EXAMINATION: General:  [place], calm Neuro:  RASS 0 HEENT:  ET Ttube +  midline tracheostomy scar Cardiovascular:  s1 s2 RRR Lungs:  CTA anterior Abdomen:  Soft, non tender Musculoskeletal: grossly normal Skin:  No rash  LABS: PULMONARY  Recent Labs Lab 01/10/14 1642 01/10/14 2052 01/11/14 0006 01/13/14 1241 01/14/14 0430  PHART 7.183* 7.256* 7.437 7.447 7.424  PCO2ART 52.0* 43.3 35.8 44.7 50.9*  PO2ART 108.0* 133.0* 94.0 91.0 95.7  HCO3 19.3* 19.4* 24.3* 30.9* 32.8*  TCO2 21 21 25  32 34.4  O2SAT 96.0 99.0 98.0 97.0 97.7    CBC  Recent Labs Lab 01/13/14 0420 01/14/14 0345 01/15/14 0500  HGB 9.1* 9.1* 9.1*  HCT 27.6* 28.1* 27.9*  WBC 5.0 4.5 6.3  PLT 114* 179 266    COAGULATION  Recent Labs Lab 01/09/14 1745 01/11/14 0350 01/13/14 1000  INR 1.08 1.12 1.10    CARDIAC    Recent Labs Lab 01/09/14 1705 01/10/14 0456  TROPONINI <0.30 <0.30    Recent Labs Lab 01/09/14 1705 01/11/14 0350  PROBNP 4590.0* 1067.0*     CHEMISTRY  Recent Labs Lab 01/11/14 0350 01/12/14 0458 01/13/14 0420 01/14/14 0345 01/15/14 0500  NA  144 140 142 145 143  K 3.8 3.3* 3.2* 3.7 3.8  CL 110 106 106 103 100  CO2 22 23 26  32 35*  GLUCOSE 109* 172* 102* 96 113*  BUN 19 12 10 11 14   CREATININE 1.27* 0.86 0.74 0.87 0.91  CALCIUM 7.7* 7.6* 7.7* 8.2* 8.8  MG 1.5 1.5 1.6 2.0 1.7  PHOS 1.4* 1.4* 2.3 4.1 3.3   Estimated Creatinine Clearance: 56.7 ml/min (by C-G formula based on Cr of 0.91).   LIVER  Recent Labs Lab 01/09/14 1701 01/09/14 1745 01/11/14 0350 01/13/14 1000 01/14/14 0345  AST 31  --   --   --  12  ALT 6  --   --   --  5  ALKPHOS 83  --   --   --  62  BILITOT 0.3  --   --   --  0.2*  PROT  7.7  --   --   --  6.5  ALBUMIN 3.0*  --   --   --  2.2*  INR  --  1.08 1.12 1.10  --      INFECTIOUS  Recent Labs Lab 01/09/14 2101 01/09/14 2315 01/10/14 0456 01/10/14 0501 01/11/14 0350  LATICACIDVEN  --  0.8  --  0.6  --   PROCALCITON 5.54  --  4.63  --  2.24     ENDOCRINE CBG (last 3)   Recent Labs  01/15/14 0026 01/15/14 0434 01/15/14 0846  GLUCAP 113* 111* 110*     IMAGING x48h  Dg Chest Port 1 View  01/15/2014   CLINICAL DATA:  Hypoxia  EXAM: PORTABLE CHEST - 1 VIEW  COMPARISON:  January 14, 2014  FINDINGS: Endotracheal tube tip is 2.8 cm above the carina. Central catheter tip is in the superior vena cava near the cavoatrial junction. Nasogastric tube tip and side port are below the diaphragm. No pneumothorax. There is bibasilar interstitial edema. Lungs are otherwise clear. Heart is upper normal in size with normal pulmonary vascularity. No adenopathy.  IMPRESSION: Tube and catheter positions as described without pneumothorax. Persistent bibasilar edema. No new opacity.   Electronically Signed   By: Lowella Grip M.D.   On: 01/15/2014 07:30   Dg Chest Port 1 View  01/14/2014   CLINICAL DATA:  Check endotracheal tube position.  EXAM: PORTABLE CHEST - 1 VIEW  COMPARISON:  01/13/2014 and earlier priors  FINDINGS: Endotracheal tube is 2.6 cm above the carina. Left IJ central venous catheter terminates in the distal superior vena cava, in stable position. Nasogastric tube courses into the stomach in continues below the edge of the image.  The heart size appears normal. Mediastinal and hilar contours are normal. The knee pulmonary vascularity appears congested. There is diffuse interstitial prominence and there are persistent bibasilar airspace opacities. No significant change in aeration of the lungs. No visible pleural effusion. Negative for pneumothorax.  IMPRESSION: 1. No change in aeration of the lungs. Persistent diffuse interstitial prominence and bibasilar airspace  opacities. 2. Endotracheal tube is 2.6 cm above the carina.   Electronically Signed   By: Curlene Dolphin M.D.   On: 01/14/2014 08:23   Dg Abd Portable 1v  01/13/2014   CLINICAL DATA:  NG tube placement.  EXAM: PORTABLE ABDOMEN - 1 VIEW  COMPARISON:  None.  FINDINGS: NG tube tip is in the region of the body of the stomach with the side port in the proximal stomach. Nonobstructive bowel gas pattern. No free air organomegaly.  IMPRESSION: NG  tube tip in the body of the stomach.   Electronically Signed   By: Rolm Baptise M.D.   On: 01/13/2014 12:32    pcxr - resolved infiltrates, ett wnl   ASSESSMENT / PLAN:  PULMONARY A: Acute respiratory failure s/p intubation 312!5 Aspiration pneumonia History of tracheostomy Bronch 3/17- without sig stenosis below trach site P:   Tolerated neg balance, continue pcxr improved daily, repeat in am  Wean cpap5 ps5, goal 2 hr, assess rsbi, has strong cough Bronch if areas can see without stenosis, consider extubation Leak test  CARDIOVASCULAR A: Septic shock resolved P:  Tele continued response to lasix, assess echo  RENAL A: Acute renal failure - resolved ARDS hypomag  P:   Maintain lasix, keep daily kvo  GASTROINTESTINAL A: Suspect dysphagia and chronic aspiration  P:   Tube feeds per nutrition, hold for weaning PPI  HEMATOLOGIC A:   Mild anemia VTE Px Thrombocytopenia, dilution? Sepsis? Improved - hemoconcentration well P:  Trend CBC Heparin for prophylaxis,restart scd Not a piocture of HITT  INFECTIOUS A: Aspiration pneumonia with possible ALI, likely strep is pathogen P:   To  stop date levofloxacin  ENDOCRINE A: No active issues P:   No intervention required  NEUROLOGIC A: Acute encephalopathy Suspect opioid addiction / dependence : U tolx positive for benzo and opiopids. ETOH negative, ASA level and tylenol negative  - agiated encephalopathy severe - improved major  P:   Restart fent gtt, WUA Use fent  prn, versed prn Hold Valium, Percocet, Opana,  Consider restart Lyrica Trazodone  Global: echo, maintain lasix, weaning, hope to extubate   I have personally obtained history, examined patient, evaluated and interpreted laboratory and imaging results, reviewed medical records, formulated assessment / plan and placed orders.  CRITICAL CARE:  The patient is critically ill with multiple organ systems failure and requires high complexity decision making for assessment and support, frequent evaluation and titration of therapies, application of advanced monitoring technologies and extensive interpretation of multiple databases. Critical Care Time devoted to patient care services described in this note is 30 minutes.   [name]. Titus Mould, MD, [place] Pgr: [place] [facility]

## 2014-01-15 NOTE — Progress Notes (Signed)
NUTRITION FOLLOW UP  Intervention:    Resume TF with 70M PEPuP Protocol with Oxepa at goal rate of 35 ml/h (840 ml per day) with Prostat 30 ml BID to provide 1460 kcals, 83 gm protein, 659 ml free water daily.   Nutrition Dx:   Inadequate oral intake related to inability to eat as evidenced by NPO status. Ongoing.  Goal:   Intake to meet >90% of estimated nutrition needs.  Monitor:   TF tolerance/adequacy, weight trend, labs, vent status.  Assessment:   Patient is a 53 yo female with history of tracheostomy, tracheal stenosis previous admissions for aspiration pneumonia, RADS, sepsis and opioid addiction presented 3/12 with dyspnea, worsening cough and fever. Dysphagia suspected per admission H&P.  Patient was extubated this morning; required re-intubation with emergent tracheostomy. Still being treated for ARDS. TF on hold since 9 AM today for extubation. To be resumed later.  Patient is currently intubated on ventilator support.  MV: 8.4 L/min Temp (24hrs), Avg:99.1 F (37.3 C), Min:98.5 F (36.9 C), Max:99.8 F (37.7 C)   Height: Ht Readings from Last 1 Encounters:  01/10/14 5' 1.81" (1.57 m)    Weight Status:   Wt Readings from Last 1 Encounters:  01/15/14 123 lb 0.3 oz (55.8 kg)  01/10/14  119 lb 4.3 oz (54.1 kg)   Re-estimated needs:  Kcal: 1405 Protein: 75-90 gm Fluid: 1.5 L  Skin: no wounds  Diet Order: NPO   Intake/Output Summary (Last 24 hours) at 01/15/14 1444 Last data filed at 01/15/14 1400  Gross per 24 hour  Intake 1809.38 ml  Output   2600 ml  Net -790.62 ml    Last BM: 3/18 (flexiseal)   Labs:   Recent Labs Lab 01/13/14 0420 01/14/14 0345 01/15/14 0500  NA 142 145 143  K 3.2* 3.7 3.8  CL 106 103 100  CO2 26 32 35*  BUN 10 11 14   CREATININE 0.74 0.87 0.91  CALCIUM 7.7* 8.2* 8.8  MG 1.6 2.0 1.7  PHOS 2.3 4.1 3.3  GLUCOSE 102* 96 113*    CBG (last 3)   Recent Labs  01/15/14 0434 01/15/14 0846 01/15/14 1122  GLUCAP 111*  110* 120*    Scheduled Meds: . antiseptic oral rinse  15 mL Mouth Rinse QID  . chlorhexidine  15 mL Mouth Rinse BID  . Chlorhexidine Gluconate Cloth  6 each Topical Q0600  . feeding supplement (OXEPA)  1,000 mL Per Tube Q24H  . feeding supplement (PRO-STAT SUGAR FREE 64)  30 mL Per Tube Q1500  . furosemide  20 mg Intravenous Q12H  . heparin subcutaneous  5,000 Units Subcutaneous 3 times per day  . latanoprost  1 drop Both Eyes QHS  . levofloxacin (LEVAQUIN) IV  750 mg Intravenous Q24H  . pregabalin  150 mg Oral BID  . traZODone  100 mg Oral QHS    Continuous Infusions: . sodium chloride 10 mL/hr (01/13/14 0947)  . sodium chloride 20 mL/hr at 01/15/14 0700  . fentaNYL infusion INTRAVENOUS 200 mcg/hr (01/15/14 1300)  . norepinephrine (LEVOPHED) Adult infusion Stopped (01/12/14 0852)  . propofol Stopped (01/15/14 1200)    Molli Barrows, RD, LDN, [place] Pager [phone] After Hours Pager [phone]

## 2014-01-16 ENCOUNTER — Inpatient Hospital Stay
Admission: AD | Admit: 2014-01-16 | Discharge: 2014-01-24 | Payer: Medicare Other | Source: Ambulatory Visit | Attending: Internal Medicine | Admitting: Internal Medicine

## 2014-01-16 ENCOUNTER — Inpatient Hospital Stay (HOSPITAL_COMMUNITY): Payer: Medicare Other

## 2014-01-16 DIAGNOSIS — J80 Acute respiratory distress syndrome: Secondary | ICD-10-CM | POA: Diagnosis present

## 2014-01-16 DIAGNOSIS — J9600 Acute respiratory failure, unspecified whether with hypoxia or hypercapnia: Secondary | ICD-10-CM

## 2014-01-16 DIAGNOSIS — J9503 Malfunction of tracheostomy stoma: Secondary | ICD-10-CM

## 2014-01-16 DIAGNOSIS — J690 Pneumonitis due to inhalation of food and vomit: Secondary | ICD-10-CM | POA: Diagnosis present

## 2014-01-16 DIAGNOSIS — J189 Pneumonia, unspecified organism: Secondary | ICD-10-CM | POA: Diagnosis present

## 2014-01-16 DIAGNOSIS — G8921 Chronic pain due to trauma: Secondary | ICD-10-CM | POA: Diagnosis present

## 2014-01-16 LAB — CBC WITH DIFFERENTIAL/PLATELET
BASOS ABS: 0 10*3/uL (ref 0.0–0.1)
BASOS PCT: 0 % (ref 0–1)
EOS ABS: 0.7 10*3/uL (ref 0.0–0.7)
EOS PCT: 9 % — AB (ref 0–5)
HCT: 28.1 % — ABNORMAL LOW (ref 36.0–46.0)
Hemoglobin: 8.8 g/dL — ABNORMAL LOW (ref 12.0–15.0)
Lymphocytes Relative: 28 % (ref 12–46)
Lymphs Abs: 2.1 10*3/uL (ref 0.7–4.0)
MCH: 29.2 pg (ref 26.0–34.0)
MCHC: 31.3 g/dL (ref 30.0–36.0)
MCV: 93.4 fL (ref 78.0–100.0)
Monocytes Absolute: 0.9 10*3/uL (ref 0.1–1.0)
Monocytes Relative: 12 % (ref 3–12)
NEUTROS PCT: 50 % (ref 43–77)
Neutro Abs: 3.7 10*3/uL (ref 1.7–7.7)
PLATELETS: 309 10*3/uL (ref 150–400)
RBC: 3.01 MIL/uL — AB (ref 3.87–5.11)
RDW: 15 % (ref 11.5–15.5)
WBC: 7.4 10*3/uL (ref 4.0–10.5)

## 2014-01-16 LAB — GLUCOSE, CAPILLARY
GLUCOSE-CAPILLARY: 85 mg/dL (ref 70–99)
Glucose-Capillary: 82 mg/dL (ref 70–99)
Glucose-Capillary: 79 mg/dL (ref 70–99)
Glucose-Capillary: 88 mg/dL (ref 70–99)

## 2014-01-16 LAB — BLOOD GAS, ARTERIAL
Acid-Base Excess: 4.9 mmol/L — ABNORMAL HIGH (ref 0.0–2.0)
Bicarbonate: 28.8 mEq/L — ABNORMAL HIGH (ref 20.0–24.0)
FIO2: 0.4 %
LHR: 18 {breaths}/min
O2 SAT: 98.9 %
PATIENT TEMPERATURE: 98.6
PEEP/CPAP: 5 cmH2O
TCO2: 30.1 mmol/L (ref 0–100)
VT: 340 mL
pCO2 arterial: 42.2 mmHg (ref 35.0–45.0)
pH, Arterial: 7.449 (ref 7.350–7.450)
pO2, Arterial: 117 mmHg — ABNORMAL HIGH (ref 80.0–100.0)

## 2014-01-16 LAB — CULTURE, BLOOD (ROUTINE X 2): Culture: NO GROWTH

## 2014-01-16 LAB — BASIC METABOLIC PANEL
BUN: 13 mg/dL (ref 6–23)
CO2: 35 mEq/L — ABNORMAL HIGH (ref 19–32)
Calcium: 9 mg/dL (ref 8.4–10.5)
Chloride: 99 mEq/L (ref 96–112)
Creatinine, Ser: 0.83 mg/dL (ref 0.50–1.10)
GFR calc Af Amer: >90 mL/min (ref >90)
GFR, EST NON AFRICAN AMERICAN: 80 mL/min — AB (ref >90)
Glucose, Bld: 87 mg/dL (ref 70–99)
Potassium: 3.7 mEq/L (ref 3.7–5.3)
SODIUM: 142 meq/L (ref 137–147)

## 2014-01-16 MED ORDER — OXEPA PO LIQD: 1000.0000 mL | ORAL | Status: DC

## 2014-01-16 MED ORDER — POTASSIUM CHLORIDE 10 MEQ/50ML IV SOLN
10.0000 meq | INTRAVENOUS | Status: AC
  Administered 2014-01-16 (×2): 10 meq via INTRAVENOUS
  Filled 2014-01-16 (×2): qty 50

## 2014-01-16 MED ORDER — HEPARIN SODIUM (PORCINE) 5000 UNIT/ML IJ SOLN: 5000.0000 [IU] | Freq: Three times a day (TID) | INTRAMUSCULAR | Status: DC

## 2014-01-16 MED ORDER — CHLORHEXIDINE GLUCONATE CLOTH 2 % EX PADS: 6.0000 | MEDICATED_PAD | Freq: Every day | CUTANEOUS | Status: DC

## 2014-01-16 MED ORDER — FENTANYL 50 MCG/HR TD PT72
50.0000 ug | MEDICATED_PATCH | TRANSDERMAL | Status: DC
  Administered 2014-01-16: 50 ug via TRANSDERMAL
  Filled 2014-01-16: qty 1

## 2014-01-16 MED ORDER — PRO-STAT SUGAR FREE PO LIQD: 30.0000 mL | Freq: Two times a day (BID) | ORAL | Status: DC

## 2014-01-16 MED ORDER — CHLORHEXIDINE GLUCONATE 0.12 % MT SOLN
15.0000 mL | Freq: Two times a day (BID) | OROMUCOSAL | Status: DC
Start: 2014-01-16 — End: 2014-05-20

## 2014-01-16 MED ORDER — SODIUM CHLORIDE 0.9 % IV SOLN: 50.0000 ug/h | INTRAVENOUS | Status: DC

## 2014-01-16 MED ORDER — FUROSEMIDE 10 MG/ML IJ SOLN: 20.0000 mg | Freq: Two times a day (BID) | INTRAMUSCULAR | Status: DC

## 2014-01-16 NOTE — Procedures (Signed)
Preop diagnosis: Tracheal stenosis, tracheostomy status Postop diagnosis: same Procedure: Transnasal fiberoptic laryngoscopy Surgeon: Redmond Baseman Anesth: None Compl: None Findings: There is scar narrowing the nasopharynx, more from the right side.  The larynx is abnormal without clearly seen true vocal folds.  The glottis was passed and the area above the tracheostomy was seen but with a limited view due to secretions. Description: After discussing risks, benefits, and alternatives, the fiberoptic laryngoscope was passed through the right nasal passage to view the pharynx and larynx.  After completion, the scope was removed and the patient was returned to nursing care in stable condition.
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
## 2014-01-16 NOTE — Op Note (Signed)
Whitney Marks, Whitney Marks NO.:  [id number]  MEDICAL RECORD NO.:  [id number]  LOCATION:  2M16C                        FACILITY:  [facility]  PHYSICIAN:  Raylene Miyamoto, MD DATE OF BIRTH:  Sep 08, 1961  DATE OF PROCEDURE: DATE OF DISCHARGE:                              OPERATIVE REPORT   PROCEDURE:  Emergency tracheostomy.  Unable to obtain consent secondary to the emergency nature of the procedure and unstable airway.  PREOPERATIVE DIAGNOSES:  History of tracheal stenosis with now Streptococcus pneumoniae and rapidly progressive upper airway stridor and hypoxia, postextubation.  This patient was extubated after seeing a leak, positive test and weaning appropriately with good mental status with a prior bronchoscopy to assess everything I could evaluate from around inside the airway. Upon extubation, the patient went into rapidly to desaturation, respiratory distress and unable to move air well secondary to stridor. Please see my intubation note where an endotracheal tube was barely able to pass through a very narrowed upper airway with size of a pinhole proximally airway and clearly concerning chronic changes of the vocal cord airway.  Since I was unable to pass only a 7.02 barely through that and we had unstable airway, then went to perform an emergent tracheostomy.  I asked the assistance of Dr. Ellin Goodie, who directly visualized the procedure through the current endotracheal tube.  I used ChloraPrep to ChloraPrep the patient's neck.  The patient was sedated with Versed, fentanyl, propofol and vecuronium paralytic.  The patient was synchronous with the ventilator machine and was saturating 100% during the procedure.  After chlorhexidine preparation, I injected 7 mL of lidocaine plus epinephrine through the prior trach scar proximally over the second endotracheal space.  I then made a vertical incision about 1 cm.  I was able to dissect through significantly  fibrinous material onto the top of the airway, I was able to locate the strap muscle on the left easily.  I then placed an 18-gauge needle over a white catheter sheath into the airway directly visualized by Dr. Nelda Marseille without any posterior wall injury.  The white catheter sheath remained and the needles were removed.  A wire was placed through the white catheter sheath and the white catheter sheath was removed.  I then placed a 14-French punch dilator in and out of the airway, then placed a progressive Rhino dilator to approximately 30-French over glider successfully in and out of the airway.  Then, placed a size 6 tracheostomy over 26-French dilator into the airway successfully. Everything was removed except for the tracheostomy.  This was all directly visualized by Dr. Nelda Marseille without any injury noted to the posterior wall.  The bronchoscopist then placed a bronchoscope through the new tracheostomy tube near the carina below approximately 4 cm without any apparent injury.  There was no active bleeding.  The tracheostomy was then sutured in place with four monofilament sutures. Blood loss for the procedure was less than 10 mL.  The patient tolerated the procedure quite well, then endotracheal tube could be removed, and we then had a stable airway.  Postprocedure, I did perform a chest x- ray, which showed a well placed tracheostomy, no apparent injury.  I then also discussed  with the patient's husband the emergent nature of the need for the emergent surgical airway.  Then, we will make a consideration for consultation for ENT for dilatation of the upper airway or whatever else was recommended to be able to have this patient progressed with Passy-Muir valve, etc.     Raylene Miyamoto, MD     DJF/MEDQ  D:  01/15/2014  T:  01/16/2014  Job:  [id number]

## 2014-01-16 NOTE — Discharge Summary (Signed)
Physician Discharge Summary  Patient ID: ALYDIA GOSSER MRN: [id number] DOB/AGE: [birth year] 53 y.o.  Admit date: 01/09/2014 Discharge date: 01/16/2014    Discharge Diagnoses:  Active Problems:   Aspiration pneumonia   Tracheal stenosis following tracheostomy   Severe sepsis(995.92)   Acute respiratory failure   Pneumonia   Septic shock(785.52)    Brief Summary: KAELIE HENIGAN is a 53 y.o. y/o female with a PMH of OSA, previous trach/ tracheal stenosis, multiple previous admissions for aspiration PNA, ARDS, opioid addiction admitted 3/12 with worsening cough, SOB and fever.  Admitted with acute on chronic respiratory failure, aspiration PNA and sepsis.  She initially refused intubation, CVL placement.  She was treated with abx and fluids.  She deteriorated quickly requiring emergent intubation 3/12.  She weaned well and was extubated 3/18, but developed immediate stridor, desat and near arrest, ultimately requiring emergent bedside trach.  ENT has evaluated, plans as below.  She is hemodynamically stable, tolerating some PS wean and ready for d/c to select for further weaning and rehab efforts.   SIGNIFICANT EVENTS / STUDIES:  3/12 ETT placed  3/16- off pressors  3/17- bronch images without sig stenosis  3/18>> post extubation developed immediate stridor, rapid desat, near arrest. Required emergent trach.  3/19 - laryngoscopy>> scar narrowing the nasopharynx, more from the right side. The larynx is abnormal without clearly seen true vocal folds.    LINES / TUBES:  ETT 01/09/14 >> 3/18 Trach (DF, Emergent) 3/18>>> R IJ CVL 3/12>>>  CULTURES:  3/12 Blood >>> neg 3/12 Urine >> neg 3/12 Sputum >>>strep pna  01/09/14 MRSA PCr - POSITIVE  3/12 BAL - strep, H flu  .Marland Kitchen  3/15 - tracheal aspireate resp virus multiplex >>Meta pneumoviris   ANTIBIOTICS:  Vancomyicn 3/12 >>>3/16  Zosyn 3/12 >>>3/16  Levaquin 3/12 >>>stop 3/18                  Hospital Summary by Discharge Diagnosis  Tracheal stenosis S/p trach  VDRF Aspiration PNA  PLAN -  -Per ENT--> With a freshly placed tracheostomy, I recommended waiting a week or so before proceeding with microlaryngoscopy under anesthesia with potential dilation. The first procedure will be more to evaluate the anatomy and formulate a more formal plan for future management. The likelihood is that the tracheostomy tube will not be removed at that time. The alternative option would be for her to be evaluated at [facility] where her airway has been managed previously.  -NO DECANNULATION  -vent wean per protocol as tol  -f/u CXR    Dysphagia  Chronic aspiration  PLAN -  -tube feeds per nutrition - awaiting panda tube - may need IR placement  -ST to follow   Aspiration pneumonia with possible ALI, likely strep is pathogen Meta pneumovirus  PLAN -  -s/p course abx - monitor off abx   Opioid addiction  Encephalopathy - improved  Chronic pain  PLAN -  -Cont fentanyl gtt for now with easily agitated -Goal wean off gtt to fent patch +/- PRN  -Cont trazadone, valium   Filed Vitals:   01/16/14 0800 01/16/14 0856 01/16/14 0908 01/16/14 1000  BP: 110/66  107/73 111/72  Pulse: 76  84 93  Temp:  99 F (37.2 C)    TempSrc:  Oral    Resp: $Remo'15  23 17  'qHNPV$ Height:      Weight:      SpO2: 100%  100% 94%     Discharge Labs  BMET  Recent Labs  Lab 01/11/14 0350 01/12/14 0458 01/13/14 0420 01/14/14 0345 01/15/14 0500 01/16/14 0405  NA 144 140 142 145 143 142  K 3.8 3.3* 3.2* 3.7 3.8 3.7  CL 110 106 106 103 100 99  CO2 $Re'22 23 26 'mEe$ 32 35* 35*  GLUCOSE 109* 172* 102* 96 113* 87  BUN $Re'19 12 10 11 14 13  'DQP$ CREATININE 1.27* 0.86 0.74 0.87 0.91 0.83  CALCIUM 7.7* 7.6* 7.7* 8.2* 8.8 9.0  MG 1.5 1.5 1.6 2.0 1.7  --   PHOS 1.4* 1.4* 2.3 4.1 3.3  --      CBC   Recent Labs Lab 01/14/14 0345 01/15/14 0500 01/16/14 0405  HGB 9.1* 9.1* 8.8*  HCT 28.1* 27.9* 28.1*  WBC 4.5 6.3 7.4   PLT 179 266 309   Anti-Coagulation  Recent Labs Lab 01/09/14 1745 01/11/14 0350 01/13/14 1000  INR 1.08 1.12 1.10            Follow-up Information   Follow up with PROVIDER NOT IN SYSTEM.         Medication List    STOP taking these medications       food thickener Powd  Commonly known as:  RESOURCE THICKENUP CLEAR     oxyCODONE-acetaminophen 10-325 MG per tablet  Commonly known as:  PERCOCET     oxymorphone 20 MG 12 hr tablet  Commonly known as:  OPANA ER     VOLTAREN 1 % Gel  Generic drug:  diclofenac sodium      TAKE these medications       chlorhexidine 0.12 % solution  Commonly known as:  PERIDEX  15 mLs by Mouth Rinse route 2 (two) times daily.     Chlorhexidine Gluconate Cloth 2 % Pads  Apply 6 each topically daily at 6 (six) AM.     diazepam 5 MG tablet  Commonly known as:  VALIUM  Take 5 mg by mouth every 8 (eight) hours as needed. anxiety     feeding supplement (OXEPA) Liqd  Place 1,000 mLs into feeding tube daily.     feeding supplement (PRO-STAT SUGAR FREE 64) Liqd  Place 30 mLs into feeding tube 2 (two) times daily.     furosemide 10 MG/ML injection  Commonly known as:  LASIX  Inject 2 mLs (20 mg total) into the vein every 12 (twelve) hours.     heparin 5000 UNIT/ML injection  Inject 1 mL (5,000 Units total) into the skin every 8 (eight) hours.     pregabalin 150 MG capsule  Commonly known as:  LYRICA  Take 150 mg by mouth 2 (two) times daily.     sodium chloride 0.9 % SOLN 200 mL with fentaNYL 0.05 MG/ML SOLN 2,500 mcg  Inject 50 mcg/hr into the vein continuous.     TRAVATAN Z 0.004 % Soln ophthalmic solution  Generic drug:  Travoprost (BAK Free)  Place 1 drop into both eyes 2 (two) times daily.     traZODone 50 MG tablet  Commonly known as:  DESYREL  Take 100 mg by mouth at bedtime.          Disposition: LTAC   Discharged Condition: GUSSIE MURTON has met maximum benefit of inpatient care and is medically  stable and cleared for discharge.  Patient is pending follow up as above.      Time spent on disposition:  Greater than 35 minutes.   SignedDarlina Sicilian, NP 01/16/2014  10:59 AM Pager: [phone] or [phone]  *Care during the  described time interval was provided by me and/or other providers on the critical care team. I have reviewed this patient's available data, including medical history, events of note, physical examination and test results as part of my evaluation.      7-10 days Dr Melida Quitter will have her to OR for re evaluation of therapeutic intervention for tracheal stenosis to [facility] Would not use PMV at this time Assess slp May need ngt  [name]. Titus Mould, MD, [place] Pgr: [facility]

## 2014-01-16 NOTE — Progress Notes (Signed)
SLP Cancellation Note  Patient Details Name: Whitney Marks MRN: [id number] DOB: 1961-07-13   Cancelled treatment:       Reason Eval/Treat Not Completed: Medical issues which prohibited therapy (Placed back on full vent support. Will f/u. )  Gabriel Rainwater [place], CCC-SLP [phone]  Gen Clagg Meryl 01/16/2014, 10:58 AM

## 2014-01-16 NOTE — Progress Notes (Signed)
Patient placed on ventilator for full support due to increased WOB and agitation.

## 2014-01-16 NOTE — Progress Notes (Signed)
[facility] ADULT ICU REPLACEMENT PROTOCOL FOR AM LAB REPLACEMENT ONLY  The patient does apply for the [facility] Adult ICU Electrolyte Replacment Protocol based on the criteria listed below:   1. Is GFR >/= 40 ml/min? yes  Patient's GFR today is 80 2. Is urine output >/= 0.5 ml/kg/hr for the last 6 hours? yes Patient's UOP is 1.8 ml/kg/hr 3. Is BUN < 60 mg/dL? yes  Patient's BUN today is 13 4. Abnormal electrolyte(s):K+3.7 5. Ordered repletion with: protocol 6. If a panic level lab has been reported, has the CCM MD in charge been notified? yes.   Physician:  Roosvelt Maser, Eddie Dibbles [place] 01/16/2014 5:27 AM

## 2014-01-17 ENCOUNTER — Other Ambulatory Visit (HOSPITAL_COMMUNITY): Payer: Self-pay

## 2014-01-17 DIAGNOSIS — J9503 Malfunction of tracheostomy stoma: Secondary | ICD-10-CM

## 2014-01-17 DIAGNOSIS — J9589 Other postprocedural complications and disorders of respiratory system, not elsewhere classified: Secondary | ICD-10-CM

## 2014-01-17 LAB — PREALBUMIN: Prealbumin: 14.9 mg/dL — ABNORMAL LOW (ref 17.0–34.0)

## 2014-01-17 LAB — CBC
HEMATOCRIT: 32.7 % — AB (ref 36.0–46.0)
Hemoglobin: 10.4 g/dL — ABNORMAL LOW (ref 12.0–15.0)
MCH: 28.9 pg (ref 26.0–34.0)
MCHC: 31.8 g/dL (ref 30.0–36.0)
MCV: 90.8 fL (ref 78.0–100.0)
Platelets: 494 10*3/uL — ABNORMAL HIGH (ref 150–400)
RBC: 3.6 MIL/uL — ABNORMAL LOW (ref 3.87–5.11)
RDW: 14.1 % (ref 11.5–15.5)
WBC: 8.8 10*3/uL (ref 4.0–10.5)

## 2014-01-17 LAB — PROCALCITONIN: Procalcitonin: 0.18 ng/mL

## 2014-01-17 LAB — TSH: TSH: 0.368 u[IU]/mL (ref 0.350–4.500)

## 2014-01-17 NOTE — Procedures (Signed)
First Trach Change  Patient emergently trached on 3/19.  See notes for reasoning behind early change.  Sutures undone.  Tube changer placed, six cuffed shiley removed and size 4 cuffless placed without difficulty.  Good color change and O2 sat maintained.  Rush Farmer, M.D. [facility] Pulmonary/Critical Care Medicine. Pager: [phone]. After hours pager: [phone].

## 2014-01-17 NOTE — Consult Note (Signed)
PULMONARY / CRITICAL CARE MEDICINE   Name: Whitney Marks MRN: [id number] DOB: Mar 14, 1961    ADMISSION DATE:  01/16/2014 CONSULTATION DATE:3/20  REFERRING MD : [facility] PRIMARY SERVICE: [facility]  CHIEF COMPLAINT: Tracheal stenosis  BRIEF PATIENT DESCRIPTION:  53 y.o. y/o female with a PMH of OSA, previous trach/ tracheal stenosis, multiple previous admissions for aspiration PNA, ARDS, opioid addiction admitted 3/12 with worsening cough, SOB and fever. Admitted with acute on chronic respiratory failure, aspiration PNA and sepsis. She initially refused intubation, CVL placement. She was treated with abx and fluids. She deteriorated quickly requiring emergent intubation 3/12. She weaned well and was extubated 3/18, but developed immediate stridor, desat and near arrest, ultimately requiring emergent bedside trach. ENT has evaluated, plans as below. She is hemodynamically stable, tolerating some PS wean and ready for d/c to select for further weaning and rehab efforts.   SIGNIFICANT EVENTS / STUDIES:  3/12 ETT placed  3/16- off pressors  3/17- bronch images without sig stenosis  3/18>> post extubation developed immediate stridor, rapid desat, near arrest. Required emergent trach.  3/19 - laryngoscopy>> scar narrowing the nasopharynx, more from the right side. The larynx is abnormal without clearly seen true vocal folds.  3/19 x to [place] / TUBES: ETT 01/09/14 >> 3/18  Trach (DF, Emergent) 3/18>>>  R IJ CVL 3/12>>>   CULTURES: 3/12 Blood >>> neg 3/12 Urine >> neg 3/12 Sputum >>>strep pna  01/09/14 MRSA PCr - POSITIVE  3/12 BAL - strep, H flu  .Marland Kitchen  3/15 - tracheal aspireate resp virus multiplex >>Meta pneumoviris   ANTIBIOTICS: Per ssh  HISTORY OF PRESENT ILLNESS:   53 y.o. y/o white female with a PMH of OSA, previous trach/ tracheal stenosis, multiple previous admissions for aspiration PNA, ARDS, opioid addiction admitted 3/12 with worsening cough, SOB and fever. She failed extubation  3/18 due to stridor and requires urgent tracheostomy per Dr. Titus Mould on 3/18. She was  Evaluated by ENT(Dr Redmond Baseman) and found to have scarring and tracheal stenosis. She will need dilatation in future most likely at [facility]. No decannulation till then.  PAST MEDICAL HISTORY :  Past Medical History  Diagnosis Date  . Pneumonia   . Chronic pain   . Arthritis   . Difficult intubation   . Shortness of breath   . Sleep apnea   . Cancer of cervix   . Recurrent upper respiratory infection (URI)   . Headache(784.0)   . Anxiety   . Neuromuscular disorder   . Fibromyalgia   . Depression    Past Surgical History  Procedure Laterality Date  . Neck surgery     Prior to Admission medications   Medication Sig Start Date End Date Taking? Authorizing Provider  Amino Acids-Protein Hydrolys (FEEDING SUPPLEMENT, PRO-STAT SUGAR FREE 64,) LIQD Place 30 mLs into feeding tube 2 (two) times daily. 01/16/14   Marijean Heath, NP  chlorhexidine (PERIDEX) 0.12 % solution 15 mLs by Mouth Rinse route 2 (two) times daily. 01/16/14   Marijean Heath, NP  Chlorhexidine Gluconate Cloth 2 % PADS Apply 6 each topically daily at 6 (six) AM. 01/16/14   Marijean Heath, NP  diazepam (VALIUM) 5 MG tablet Take 5 mg by mouth every 8 (eight) hours as needed. anxiety     Historical Provider, MD  furosemide (LASIX) 10 MG/ML injection Inject 2 mLs (20 mg total) into the vein every 12 (twelve) hours. 01/16/14   Marijean Heath, NP  heparin 5000 UNIT/ML injection Inject 1 mL (  5,000 Units total) into the skin every 8 (eight) hours. 01/16/14   Marijean Heath, NP  Nutritional Supplements (FEEDING SUPPLEMENT, OXEPA,) LIQD Place 1,000 mLs into feeding tube daily. 01/16/14   Marijean Heath, NP  pregabalin (LYRICA) 150 MG capsule Take 150 mg by mouth 2 (two) times daily.      Historical Provider, MD  sodium chloride 0.9 % SOLN 200 mL with fentaNYL 0.05 MG/ML SOLN 2,500 mcg Inject 50 mcg/hr into the vein continuous.  01/16/14   Marijean Heath, NP  TRAVATAN Z 0.004 % SOLN ophthalmic solution Place 1 drop into both eyes 2 (two) times daily.  01/07/14   Historical Provider, MD  traZODone (DESYREL) 50 MG tablet Take 100 mg by mouth at bedtime.      Historical Provider, MD   Allergies  Allergen Reactions  . Codeine Itching  . Pentazocine Lactate Other (See Comments)    hallucinates  . Ranitidine Hcl Other (See Comments)    blisters    FAMILY HISTORY:  No family history on file. SOCIAL HISTORY:  reports that she has never smoked. She does not have any smokeless tobacco history on file. She reports that she drinks alcohol. She reports that she does not use illicit drugs.  REVIEW OF SYSTEMS:  na  SUBJECTIVE:   VITAL SIGNS: Temp:  [99 F (37.2 C)] 99 F (37.2 C) (03/19 0856) Pulse Rate:  [84-102] 99 (03/19 1218) Resp:  [17-31] 31 (03/19 1218) BP: (107-113)/(71-76) 113/76 mmHg (03/19 1218) SpO2:  [94 %-100 %] 100 % (03/19 1218) FiO2 (%):  [28 %-40 %] 40 % (03/19 1219) HEMODYNAMICS:   VENTILATOR SETTINGS: Vent Mode:  [-] PRVC FiO2 (%):  [28 %-40 %] 40 % Set Rate:  [18 bmp] 18 bmp Vt Set:  [340 mL] 340 mL PEEP:  [5 cmH20] 5 cmH20 Pressure Support:  [5 cmH20] 5 cmH20 Plateau Pressure:  [7 cmH20] 7 cmH20 INTAKE / OUTPUT: Intake/Output   None     PHYSICAL EXAMINATION: General:  Thin white female comfortable on full MVS Neuro: intact HEENT:  Trach in place, sutured in place Cardiovascular:  HSR RRR Lungs: mild rhonchi Abdomen:  +bs Musculoskeletal: intact Skin:  Warm dry  LABS:  CBC  Recent Labs Lab 01/15/14 0500 01/16/14 0405 01/17/14 0500  WBC 6.3 7.4 8.8  HGB 9.1* 8.8* 10.4*  HCT 27.9* 28.1* 32.7*  PLT 266 309 494*   Coag's  Recent Labs Lab 01/11/14 0350 01/13/14 1000  APTT  --  32  INR 1.12 1.10   BMET  Recent Labs Lab 01/14/14 0345 01/15/14 0500 01/16/14 0405  NA 145 143 142  K 3.7 3.8 3.7  CL 103 100 99  CO2 32 35* 35*  BUN 11 14 13   CREATININE  0.87 0.91 0.83  GLUCOSE 96 113* 87   Electrolytes  Recent Labs Lab 01/13/14 0420 01/14/14 0345 01/15/14 0500 01/16/14 0405  CALCIUM 7.7* 8.2* 8.8 9.0  MG 1.6 2.0 1.7  --   PHOS 2.3 4.1 3.3  --    Sepsis Markers  Recent Labs Lab 01/11/14 0350 01/17/14 0500  PROCALCITON 2.24 0.18   ABG  Recent Labs Lab 01/13/14 1241 01/14/14 0430 01/16/14 1830  PHART 7.447 7.424 7.449  PCO2ART 44.7 50.9* 42.2  PO2ART 91.0 95.7 117.0*   Liver Enzymes  Recent Labs Lab 01/14/14 0345  AST 12  ALT 5  ALKPHOS 62  BILITOT 0.2*  ALBUMIN 2.2*   Cardiac Enzymes  Recent Labs Lab 01/11/14 0350  PROBNP 1067.0*  Glucose  Recent Labs Lab 01/15/14 1617 01/15/14 1909 01/16/14 0017 01/16/14 0411 01/16/14 0841 01/16/14 1130  GLUCAP 102* 91 85 82 79 88    Imaging Dg Chest Port 1 View  01/17/2014   CLINICAL DATA:  Respiratory failure  EXAM: PORTABLE CHEST - 1 VIEW  COMPARISON:  01/16/2014  FINDINGS: Cardiac shadow is stable. A left-sided central venous line and tracheostomy tube are again noted and stable. The lungs are well-aerated without focal infiltrate or sizable effusion. Mild interstitial changes are again noted. Minimal right basilar atelectasis is noted.  IMPRESSION: Interstitial changes stable from the prior exam.  Mild right basilar atelectasis.   Electronically Signed   By: Inez Catalina M.D.   On: 01/17/2014 08:04   Dg Chest Port 1 View  01/16/2014   CLINICAL DATA:  Evaluate edema  EXAM: PORTABLE CHEST - 1 VIEW  COMPARISON:  Prior radiograph from 01/15/2014.  FINDINGS: Tracheostomy tube in stable position overlying the upper airway. Left IJ approach central venous catheter is stable with tip overlying the distal SVC.  Diffuse prominence of the interstitial markings again seen, likely related to interstitial edema. Overall, these are not significantly changed relative to previous study. No definite pleural effusion, although the left costophrenic angle is incompletely  visualized. Slightly more confluent and asymmetric opacity within the right lung apex is similar. No pneumothorax.  Osseous structures are unchanged.  IMPRESSION: 1. No significant interval change in diffuse prominence of the interstitial markings, greatest within the lung bases, likely related to interstitial edema. 2. Similar appearance of more confluent patchy opacity within the right lung apex. While this finding may be related to edema, possible superimposed infection could also be considered in the correct clinical setting.   Electronically Signed   By: Jeannine Boga M.D.   On: 01/16/2014 06:05   Dg Chest Port 1 View  01/15/2014   CLINICAL DATA:  Tracheostomy placement.  EXAM: PORTABLE CHEST - 1 VIEW  COMPARISON:  DG CHEST 1V PORT dated 01/15/2014; DG CHEST 1V PORT dated 01/09/2014; DG CHEST 1V PORT dated 11/01/2011  FINDINGS: Endotracheal tube has been replaced with a tracheostomy tube which projects over the tracheal air shadow. No significant abnormal pneumomediastinum or pneumothorax.  Left IJ line tip: SVC. Interstitial accentuation bilaterally with indistinct airspace opacities at the lung bases and right lung apex. Heart size within normal limits for projection.  IMPRESSION: 1. Stable interstitial accentuation in the lungs favoring the lung bases, with indistinct basilar airspace opacity and mild airspace opacity at the right lung apex. 2. Tracheostomy tube in place without complicating feature.   Electronically Signed   By: Sherryl Barters M.D.   On: 01/15/2014 11:12   .    ASSESSMENT Active Problems:   ARDS (adult respiratory distress syndrome)   CAP (community acquired pneumonia)   Aspiration pneumonia   Chronic pain due to trauma   Tracheal stenosis following tracheostomy   Acute respiratory failure    PLAN:    PULMONARY A: VDRF in 53 yo female with a history of tracheal stenosis . OSA. Aspiration Pna, Acute airway obstruction post extubation that required emergent  tracheostomy 3/18.  Her primary issue is airway obstruction above the tracheostomy insertion site and with a cuffed 6 she is unable to phonate and move air due to the size of the airway but able to tolerate TC very well, strong NIF and strong cough on exam. P:   - Change trach to a cuffless 4. - Swallow evaluation to hopefully avoid need for  G-tube. - Begin using PMV immediately post change to trach if speech therapy feels comfortable with that. - Titrate O2 down as able (should be able to go to 28% fairly quickly. - No decannulation given upper airway issues. - Will need ENT to evaluate upper airway and potentially thoracic surgery for ablation of the upper trachea. - PCCM will follow with you.  Richardson Landry Minor ACNP Maryanna Shape PCCM Pager [phone] till 3 pm If no answer page [phone] 01/17/2014, 8:53 AM  Patient seen and examined, agree with above note.  I dictated the care and orders written for this patient under my direction.  Rush Farmer, MD [phone]

## 2014-01-17 NOTE — Progress Notes (Signed)
[facility]                                                                                              Progress note     Patient Demographics  Whitney Marks, is a 53 y.o. female  MBT:[id number]  [id number]  DOB - January 06, 1961  Admit date - 01/16/2014  Admitting Physician Merton Border, MD  Outpatient Primary MD for the patient is PROVIDER NOT IN SYSTEM  LOS - 1   Respiratory failure Tracheal stenosis     Subjective:   [place] complaints of chronic pain. Denies chest pains or shortness of breath at this time.  Objective:   Vital signs  Temperature 97.6 Heart rate 84 Respiratory rate 19 Blood pressure 126/87 Pulse ox 99%    Exam Awake Alert, Oriented X 3, No new F.N deficits, flat affect  [place].AT,PERRAL Supple Neck,No JVD, No cervical lymphadenopathy appriciated. Trach in place Symmetrical Chest wall movement, decreased breath sounds bilaterally,  RRR,No Gallops,Rubs or new Murmurs, No Parasternal Heave +ve B.Sounds, Abd Soft, Non tender, No organomegaly appriciated, No rebound - guarding or rigidity. No Cyanosis, Clubbing or edema, No new Rash or bruise     I&Os 480/500   Trach #4 placed today/cuff less  Data Review   CBC  Recent Labs Lab 01/12/14 0458 01/13/14 0420 01/14/14 0345 01/15/14 0500 01/16/14 0405 01/17/14 0500  WBC 4.9 5.0 4.5 6.3 7.4 8.8  HGB 9.4* 9.1* 9.1* 9.1* 8.8* 10.4*  HCT 28.7* 27.6* 28.1* 27.9* 28.1* 32.7*  PLT 139* 114* 179 266 309 494*  MCV 90.3 89.3 91.5 93.0 93.4 90.8  MCH 29.6 29.4 29.6 30.3 29.2 28.9  MCHC 32.8 33.0 32.4 32.6 31.3 31.8  RDW 15.5 15.1 15.4 15.2 15.0 14.1  LYMPHSABS 2.4 2.3 1.9 2.0 2.1  --   MONOABS 0.3 0.4 0.6 0.7 0.9  --   EOSABS 0.0 0.1 0.3 0.6 0.7  --   BASOSABS 0.1 0.0 0.0 0.0 0.0  --     Chemistries   Recent Labs Lab 01/11/14 0350 01/12/14 0458 01/13/14 0420 01/14/14 0345 01/15/14 0500  01/16/14 0405  NA 144 140 142 145 143 142  K 3.8 3.3* 3.2* 3.7 3.8 3.7  CL 110 106 106 103 100 99  CO2 22 23 26  32 35* 35*  GLUCOSE 109* 172* 102* 96 113* 87  BUN 19 12 10 11 14 13   CREATININE 1.27* 0.86 0.74 0.87 0.91 0.83  CALCIUM 7.7* 7.6* 7.7* 8.2* 8.8 9.0  MG 1.5 1.5 1.6 2.0 1.7  --   AST  --   --   --  12  --   --   ALT  --   --   --  5  --   --   ALKPHOS  --   --   --  62  --   --   BILITOT  --   --   --  0.2*  --   --     ------------------------------------------------------------------------------------------------------------------ No results found for this basename: VITAMINB12, FOLATE, FERRITIN, TIBC, IRON, RETICCTPCT,  in the last 72 hours  Coagulation profile  Recent  Labs Lab 01/11/14 0350 01/13/14 1000  INR 1.12 1.10    No results found for this basename: DDIMER,  in the last 72 hours       Assessment & Plan   Respiratory failure; #4 trach cuff less, placed today; continue with AtC trials . Not a decannulation candidate due to             tracheal stenosis caused by previous intubations Aspiration pneumonia History of septic shock History of cervical cancer to follow as outpatient Fibromyalgia History of severe sepsis Protein calorie malnutrition patient started on dysphagia 2 diet today Generalized weakness; PT/OT evaluation  Plan Continue medications Weaning trials  Code Status: Full  DVT Prophylaxis  heparin   Merton Border M.D on 01/17/2014 at 1:05 PM

## 2014-01-18 ENCOUNTER — Other Ambulatory Visit (HOSPITAL_COMMUNITY): Payer: Self-pay

## 2014-01-18 LAB — CBC
HCT: 36.5 % (ref 36.0–46.0)
Hemoglobin: 12 g/dL (ref 12.0–15.0)
MCH: 29.9 pg (ref 26.0–34.0)
MCHC: 32.9 g/dL (ref 30.0–36.0)
MCV: 90.8 fL (ref 78.0–100.0)
PLATELETS: 677 10*3/uL — AB (ref 150–400)
RBC: 4.02 MIL/uL (ref 3.87–5.11)
RDW: 14.5 % (ref 11.5–15.5)
WBC: 13.1 10*3/uL — ABNORMAL HIGH (ref 4.0–10.5)

## 2014-01-18 LAB — BASIC METABOLIC PANEL
BUN: 32 mg/dL — ABNORMAL HIGH (ref 6–23)
CO2: 32 mEq/L (ref 19–32)
Calcium: 10 mg/dL (ref 8.4–10.5)
Chloride: 95 mEq/L — ABNORMAL LOW (ref 96–112)
Creatinine, Ser: 1.2 mg/dL — ABNORMAL HIGH (ref 0.50–1.10)
GFR calc Af Amer: 59 mL/min — ABNORMAL LOW (ref >90)
GFR, EST NON AFRICAN AMERICAN: 51 mL/min — AB (ref >90)
GLUCOSE: 165 mg/dL — AB (ref 70–99)
Potassium: 3.5 mEq/L — ABNORMAL LOW (ref 3.7–5.3)
Sodium: 144 mEq/L (ref 137–147)

## 2014-01-18 LAB — PROTIME-INR
INR: 1.22 (ref 0.00–1.49)
PROTHROMBIN TIME: 15.1 s (ref 11.6–15.2)

## 2014-01-18 NOTE — Progress Notes (Signed)
[facility]                                                                                              Progress note     Patient Demographics  Whitney Marks, is a 53 y.o. female  [id number]  [id number]  DOB - April 20, 1961  Admit date - 01/16/2014  Admitting Physician Merton Border, MD  Outpatient Primary MD for the patient is PROVIDER NOT IN SYSTEM  LOS - 2   Respiratory failure Tracheal stenosis     Subjective:   [place] complaints of chronic pain. Denies chest pains or shortness of breath at this time.  Objective:   Vital signs  Temperature 98 Heart rate 94 Respiratory rate 20 Blood pressure 125/73 Pulse ox 100%  Exam Awake Alert, Oriented X 3, No new F.N deficits, flat affect  [place].AT,PERRAL Supple Neck,No JVD, No cervical lymphadenopathy appriciated. Trach in place Symmetrical Chest wall movement, decreased breath sounds bilaterally,  RRR,No Gallops,Rubs or new Murmurs, No Parasternal Heave +ve B.Sounds, Abd Soft, Non tender, No organomegaly appriciated, No rebound - guarding or rigidity. No Cyanosis, Clubbing or edema, No new Rash or bruise     I&Os unknown   Trach #4 placed today/cuff less  Data Review   CBC  Recent Labs Lab 01/12/14 0458 01/13/14 0420 01/14/14 0345 01/15/14 0500 01/16/14 0405 01/17/14 0500 01/18/14 1050  WBC 4.9 5.0 4.5 6.3 7.4 8.8 13.1*  HGB 9.4* 9.1* 9.1* 9.1* 8.8* 10.4* 12.0  HCT 28.7* 27.6* 28.1* 27.9* 28.1* 32.7* 36.5  PLT 139* 114* 179 266 309 494* 677*  MCV 90.3 89.3 91.5 93.0 93.4 90.8 90.8  MCH 29.6 29.4 29.6 30.3 29.2 28.9 29.9  MCHC 32.8 33.0 32.4 32.6 31.3 31.8 32.9  RDW 15.5 15.1 15.4 15.2 15.0 14.1 14.5  LYMPHSABS 2.4 2.3 1.9 2.0 2.1  --   --   MONOABS 0.3 0.4 0.6 0.7 0.9  --   --   EOSABS 0.0 0.1 0.3 0.6 0.7  --   --   BASOSABS 0.1 0.0 0.0 0.0 0.0  --   --     Chemistries   Recent Labs Lab  01/12/14 0458 01/13/14 0420 01/14/14 0345 01/15/14 0500 01/16/14 0405 01/18/14 1050  NA 140 142 145 143 142 144  K 3.3* 3.2* 3.7 3.8 3.7 3.5*  CL 106 106 103 100 99 95*  CO2 23 26 32 35* 35* 32  GLUCOSE 172* 102* 96 113* 87 165*  BUN 12 10 11 14 13  32*  CREATININE 0.86 0.74 0.87 0.91 0.83 1.20*  CALCIUM 7.6* 7.7* 8.2* 8.8 9.0 10.0  MG 1.5 1.6 2.0 1.7  --   --   AST  --   --  12  --   --   --   ALT  --   --  5  --   --   --   ALKPHOS  --   --  62  --   --   --   BILITOT  --   --  0.2*  --   --   --     ------------------------------------------------------------------------------------------------------------------ No  results found for this basename: VITAMINB12, FOLATE, FERRITIN, TIBC, IRON, RETICCTPCT,  in the last 72 hours  Coagulation profile  Recent Labs Lab 01/13/14 1000 01/18/14 1050  INR 1.10 1.22    No results found for this basename: DDIMER,  in the last 72 hours    Chest x-ray on 3/21 shows bilateral infiltrates and basilar edema.   Assessment & Plan   Respiratory failure; #4 trach cuff less, placed today; continue with AtC trials . Not a decannulation candidate due to             tracheal stenosis caused by previous intubations. Chest x-ray shows pulmonary edema Aspiration pneumonia History of septic shock History of cervical cancer to follow as outpatient Fibromyalgia History of severe sepsis Protein calorie malnutrition patient started on dysphagia 2 diet nectar thick liquids  Generalized weakness; PT/OT evaluation  Plan Lasix IV Continue medications Weaning trials  Code Status: Full  DVT Prophylaxis  heparin   Merton Border M.D on 01/18/2014 at 1:22 PM

## 2014-01-19 NOTE — Progress Notes (Signed)
[facility]                                                                                              Progress note     Patient Demographics  Whitney Marks, is a 53 y.o. female  [id number]  [id number]  DOB - Sep 01, 1961  Admit date - 01/16/2014  Admitting Physician Merton Border, MD  Outpatient Primary MD for the patient is PROVIDER NOT IN SYSTEM  LOS - 3   Respiratory failure Tracheal stenosis     Subjective:   Whitney Marks complaints of chronic pain. Denies chest pains or shortness of breath at this time.  Objective:   Vital signs  Temperature 98 Heart rate 77 Respiratory rate 24 Blood pressure 101/63 Pulse ox 99%  Exam Awake Alert, Oriented X 3, No new F.N deficits, flat affect  [place].AT,PERRAL Supple Neck,No JVD, No cervical lymphadenopathy appriciated. Trach in place Symmetrical Chest wall movement, decreased breath sounds bilaterally,  RRR,No Gallops,Rubs or new Murmurs, No Parasternal Heave +ve B.Sounds, Abd Soft, Non tender, No organomegaly appriciated, No rebound - guarding or rigidity. No Cyanosis, Clubbing or edema, No new Rash or bruise     I&Os unknown   Trach #4 SH cuffless  Data Review   CBC  Recent Labs Lab 01/13/14 0420 01/14/14 0345 01/15/14 0500 01/16/14 0405 01/17/14 0500 01/18/14 1050  WBC 5.0 4.5 6.3 7.4 8.8 13.1*  HGB 9.1* 9.1* 9.1* 8.8* 10.4* 12.0  HCT 27.6* 28.1* 27.9* 28.1* 32.7* 36.5  PLT 114* 179 266 309 494* 677*  MCV 89.3 91.5 93.0 93.4 90.8 90.8  MCH 29.4 29.6 30.3 29.2 28.9 29.9  MCHC 33.0 32.4 32.6 31.3 31.8 32.9  RDW 15.1 15.4 15.2 15.0 14.1 14.5  LYMPHSABS 2.3 1.9 2.0 2.1  --   --   MONOABS 0.4 0.6 0.7 0.9  --   --   EOSABS 0.1 0.3 0.6 0.7  --   --   BASOSABS 0.0 0.0 0.0 0.0  --   --     Chemistries   Recent Labs Lab 01/13/14 0420 01/14/14 0345 01/15/14 0500 01/16/14 0405 01/18/14 1050  NA 142 145 143 142  144  K 3.2* 3.7 3.8 3.7 3.5*  CL 106 103 100 99 95*  CO2 26 32 35* 35* 32  GLUCOSE 102* 96 113* 87 165*  BUN 10 11 14 13  32*  CREATININE 0.74 0.87 0.91 0.83 1.20*  CALCIUM 7.7* 8.2* 8.8 9.0 10.0  MG 1.6 2.0 1.7  --   --   AST  --  12  --   --   --   ALT  --  5  --   --   --   ALKPHOS  --  62  --   --   --   BILITOT  --  0.2*  --   --   --     ------------------------------------------------------------------------------------------------------------------ No results found for this basename: VITAMINB12, FOLATE, FERRITIN, TIBC, IRON, RETICCTPCT,  in the last 72 hours  Coagulation profile  Recent Labs Lab 01/13/14 1000 01/18/14 1050  INR 1.10 1.22    No results found  for this basename: DDIMER,  in the last 72 hours    Chest x-ray on 3/21 shows bilateral infiltrates and basilar edema.   Assessment & Plan   Respiratory failure; #4 trach cuff less, placed today; continue with AtC trials . Not a decannulation candidate due to             tracheal stenosis caused by previous intubations. Chest x-ray shows pulmonary edema Aspiration pneumonia History of septic shock History of cervical cancer to follow as outpatient Fibromyalgia History of severe sepsis Protein calorie malnutrition patient started on dysphagia 2 diet nectar thick liquids  Generalized weakness; PT/OT evaluation  Plan Lasix IV Continue medications Weaning trials Check labs in a.m.  Code Status: Full  DVT Prophylaxis  heparin   Merton Border M.D on 01/19/2014 at 12:58 PM

## 2014-01-20 ENCOUNTER — Other Ambulatory Visit (HOSPITAL_COMMUNITY): Payer: Self-pay

## 2014-01-20 LAB — BASIC METABOLIC PANEL
BUN: 73 mg/dL — AB (ref 6–23)
CALCIUM: 8.6 mg/dL (ref 8.4–10.5)
CO2: 29 mEq/L (ref 19–32)
Chloride: 90 mEq/L — ABNORMAL LOW (ref 96–112)
Creatinine, Ser: 3.41 mg/dL — ABNORMAL HIGH (ref 0.50–1.10)
GFR, EST AFRICAN AMERICAN: 17 mL/min — AB (ref >90)
GFR, EST NON AFRICAN AMERICAN: 14 mL/min — AB (ref >90)
Glucose, Bld: 98 mg/dL (ref 70–99)
POTASSIUM: 4.1 meq/L (ref 3.7–5.3)
Sodium: 136 mEq/L — ABNORMAL LOW (ref 137–147)

## 2014-01-20 LAB — CBC
HCT: 32.5 % — ABNORMAL LOW (ref 36.0–46.0)
Hemoglobin: 10.6 g/dL — ABNORMAL LOW (ref 12.0–15.0)
MCH: 29.2 pg (ref 26.0–34.0)
MCHC: 32.6 g/dL (ref 30.0–36.0)
MCV: 89.5 fL (ref 78.0–100.0)
Platelets: 583 10*3/uL — ABNORMAL HIGH (ref 150–400)
RBC: 3.63 MIL/uL — ABNORMAL LOW (ref 3.87–5.11)
RDW: 14.3 % (ref 11.5–15.5)
WBC: 10.3 10*3/uL (ref 4.0–10.5)

## 2014-01-21 LAB — BASIC METABOLIC PANEL
BUN: 59 mg/dL — ABNORMAL HIGH (ref 6–23)
CALCIUM: 9.3 mg/dL (ref 8.4–10.5)
CHLORIDE: 94 meq/L — AB (ref 96–112)
CO2: 31 mEq/L (ref 19–32)
CREATININE: 1.43 mg/dL — AB (ref 0.50–1.10)
GFR calc non Af Amer: 41 mL/min — ABNORMAL LOW (ref >90)
GFR, EST AFRICAN AMERICAN: 48 mL/min — AB (ref >90)
Glucose, Bld: 138 mg/dL — ABNORMAL HIGH (ref 70–99)
Potassium: 4.8 mEq/L (ref 3.7–5.3)
Sodium: 137 mEq/L (ref 137–147)

## 2014-01-22 DIAGNOSIS — J9600 Acute respiratory failure, unspecified whether with hypoxia or hypercapnia: Secondary | ICD-10-CM

## 2014-01-22 DIAGNOSIS — J690 Pneumonitis due to inhalation of food and vomit: Secondary | ICD-10-CM

## 2014-01-22 DIAGNOSIS — G8921 Chronic pain due to trauma: Secondary | ICD-10-CM

## 2014-01-22 DIAGNOSIS — J189 Pneumonia, unspecified organism: Secondary | ICD-10-CM

## 2014-01-22 NOTE — Progress Notes (Signed)
PULMONARY / CRITICAL CARE MEDICINE   Name: Whitney Marks MRN: [id number] DOB: 05-Apr-1961    ADMISSION DATE:  01/16/2014 CONSULTATION DATE:3/20  REFERRING MD : [facility] PRIMARY SERVICE: [facility]  CHIEF COMPLAINT: Tracheal stenosis  BRIEF PATIENT DESCRIPTION:  53 y.o. y/o female with a PMH of OSA, previous trach/ tracheal stenosis, multiple previous admissions for aspiration PNA, ARDS, opioid addiction admitted 3/12 with worsening cough, SOB and fever. Admitted with acute on chronic respiratory failure, aspiration PNA and sepsis. She initially refused intubation, CVL placement. She was treated with abx and fluids. She deteriorated quickly requiring emergent intubation 3/12. She weaned well and was extubated 3/18, but developed immediate stridor, desat and near arrest, ultimately requiring emergent bedside trach. ENT has evaluated, plans as below. She is hemodynamically stable, tolerating some PS wean and ready for d/c to select for further weaning and rehab efforts.   SIGNIFICANT EVENTS / STUDIES:  3/12 ETT placed  3/16- off pressors  3/17- bronch images without sig stenosis  3/18>> post extubation developed immediate stridor, rapid desat, near arrest. Required emergent trach.  3/19 - laryngoscopy>> scar narrowing the nasopharynx, more from the right side. The larynx is abnormal without clearly seen true vocal folds.  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   ANTIBIOTICS: Per ssh  SUBJECTIVE:  No distress  VITAL SIGNS: reviewed    HEMODYNAMICS:   VENTILATOR SETTINGS:   INTAKE / OUTPUT: Intake/Output   None     PHYSICAL EXAMINATION: General:  Thin white female comfortable  Neuro: intact HEENT:  Trach in place, hoarse  phonation w/ PMV Cardiovascular:  HSR RRR Lungs: mild rhonchi Abdomen:  +bs Musculoskeletal: intact Skin:  Warm dry  LABS:  CBC  Recent Labs Lab 01/17/14 0500 01/18/14 1050 01/20/14 0500  WBC 8.8 13.1* 10.3  HGB 10.4* 12.0 10.6*  HCT 32.7* 36.5 32.5*  PLT 494* 677* 583*   Coag's  Recent Labs Lab 01/18/14 1050  INR 1.22   BMET  Recent Labs Lab 01/18/14 1050 01/20/14 0500 01/21/14 0500  NA 144 136* 137  K 3.5* 4.1 4.8  CL 95* 90* 94*  CO2 32 29 31  BUN 32* 73* 59*  CREATININE 1.20* 3.41* 1.43*  GLUCOSE 165* 98 138*   Electrolytes  Recent Labs Lab 01/18/14 1050 01/20/14 0500 01/21/14 0500  CALCIUM 10.0 8.6 9.3   Sepsis Markers  Recent Labs Lab 01/17/14 0500  PROCALCITON 0.18   ABG  Recent Labs Lab 01/16/14 1830  PHART 7.449  PCO2ART 42.2  PO2ART 117.0*   Liver Enzymes No results found for this basename: AST, ALT, ALKPHOS, BILITOT, ALBUMIN,  in the last 168 hours Cardiac Enzymes No results found for this basename: TROPONINI, PROBNP,  in the last 168 hours Glucose  Recent Labs Lab 01/15/14 1617 01/15/14 1909 01/16/14 0017 01/16/14 0411 01/16/14 0841 01/16/14 1130  GLUCAP 102* 91 85 82 79 88    Imaging No results found. .    ASSESSMENT Active Problems:   ARDS (adult respiratory distress syndrome)   CAP (community acquired pneumonia)   Aspiration pneumonia   Chronic pain due to trauma   Tracheal stenosis following tracheostomy   Acute respiratory failure    PLAN:  PULMONARY A: VDRF in 53 yo female with a history of tracheal stenosis . OSA. Aspiration Pna, Acute airway obstruction post extubation that required emergent tracheostomy 3/18.  Her primary issue is airway obstruction above the tracheostomy insertion site. P:   - dysphagia diet  - cont PMV  - No decannulation given upper airway issues. -keep 4 cuffless -d/w Dr Redmond Baseman she will [facility] and stay 24 hrs , will have eval in or and possible  dialtion / other treatment Follow crt trend  [name]. Titus Mould, MD, [place] Pgr: [facility]

## 2014-01-23 LAB — BASIC METABOLIC PANEL
BUN: 34 mg/dL — ABNORMAL HIGH (ref 6–23)
CHLORIDE: 96 meq/L (ref 96–112)
CO2: 32 meq/L (ref 19–32)
Calcium: 9.6 mg/dL (ref 8.4–10.5)
Creatinine, Ser: 1.06 mg/dL (ref 0.50–1.10)
GFR calc non Af Amer: 59 mL/min — ABNORMAL LOW (ref >90)
GFR, EST AFRICAN AMERICAN: 69 mL/min — AB (ref >90)
Glucose, Bld: 92 mg/dL (ref 70–99)
Potassium: 4.6 mEq/L (ref 3.7–5.3)
SODIUM: 140 meq/L (ref 137–147)

## 2014-01-23 LAB — CBC WITH DIFFERENTIAL/PLATELET
BASOS ABS: 0.1 10*3/uL (ref 0.0–0.1)
BASOS PCT: 1 % (ref 0–1)
Eosinophils Absolute: 0.2 10*3/uL (ref 0.0–0.7)
Eosinophils Relative: 3 % (ref 0–5)
HCT: 32.2 % — ABNORMAL LOW (ref 36.0–46.0)
Hemoglobin: 10.1 g/dL — ABNORMAL LOW (ref 12.0–15.0)
Lymphocytes Relative: 48 % — ABNORMAL HIGH (ref 12–46)
Lymphs Abs: 4.1 10*3/uL — ABNORMAL HIGH (ref 0.7–4.0)
MCH: 28.7 pg (ref 26.0–34.0)
MCHC: 31.4 g/dL (ref 30.0–36.0)
MCV: 91.5 fL (ref 78.0–100.0)
Monocytes Absolute: 0.8 10*3/uL (ref 0.1–1.0)
Monocytes Relative: 9 % (ref 3–12)
NEUTROS ABS: 3.4 10*3/uL (ref 1.7–7.7)
Neutrophils Relative %: 39 % — ABNORMAL LOW (ref 43–77)
PLATELETS: 534 10*3/uL — AB (ref 150–400)
RBC: 3.52 MIL/uL — ABNORMAL LOW (ref 3.87–5.11)
RDW: 14.2 % (ref 11.5–15.5)
WBC: 8.6 10*3/uL (ref 4.0–10.5)

## 2014-01-24 ENCOUNTER — Encounter (HOSPITAL_COMMUNITY): Payer: Medicare Other | Admitting: Anesthesiology

## 2014-01-24 ENCOUNTER — Encounter (HOSPITAL_COMMUNITY)
Admission: AD | Disposition: A | Discharge status: Other Acute Inpatient Hospital | Payer: Self-pay | Source: Ambulatory Visit | Attending: Internal Medicine

## 2014-01-24 ENCOUNTER — Inpatient Hospital Stay
Admission: RE | Admit: 2014-01-24 | Discharge: 2014-02-05 | Disposition: A | Discharge status: Home Health | Payer: Medicare Other | Source: Other Acute Inpatient Hospital | Attending: Internal Medicine | Admitting: Internal Medicine

## 2014-01-24 ENCOUNTER — Encounter: Payer: Self-pay | Admitting: Anesthesiology

## 2014-01-24 ENCOUNTER — Encounter (HOSPITAL_COMMUNITY): Payer: Self-pay | Admitting: Anesthesiology

## 2014-01-24 DIAGNOSIS — J80 Acute respiratory distress syndrome: Secondary | ICD-10-CM

## 2014-01-24 DIAGNOSIS — J9600 Acute respiratory failure, unspecified whether with hypoxia or hypercapnia: Secondary | ICD-10-CM

## 2014-01-24 DIAGNOSIS — A419 Sepsis, unspecified organism: Secondary | ICD-10-CM

## 2014-01-24 DIAGNOSIS — J9589 Other postprocedural complications and disorders of respiratory system, not elsewhere classified: Secondary | ICD-10-CM

## 2014-01-24 DIAGNOSIS — J690 Pneumonitis due to inhalation of food and vomit: Secondary | ICD-10-CM

## 2014-01-24 DIAGNOSIS — J189 Pneumonia, unspecified organism: Secondary | ICD-10-CM

## 2014-01-24 DIAGNOSIS — R6521 Severe sepsis with septic shock: Principal | ICD-10-CM

## 2014-01-24 DIAGNOSIS — J9503 Malfunction of tracheostomy stoma: Secondary | ICD-10-CM

## 2014-01-24 HISTORY — PX: LARYNGOSCOPY: SHX5203

## 2014-01-24 SURGERY — LARYNGOSCOPY
Anesthesia: General

## 2014-01-24 MED ORDER — PROPOFOL 10 MG/ML IV BOLUS
INTRAVENOUS | Status: AC
  Filled 2014-01-24: qty 20

## 2014-01-24 MED ORDER — LIDOCAINE HCL (CARDIAC) 20 MG/ML IV SOLN
INTRAVENOUS | Status: DC | PRN
  Administered 2014-01-24: 70 mg via INTRAVENOUS

## 2014-01-24 MED ORDER — LACTATED RINGERS IV SOLN: INTRAVENOUS | Status: DC

## 2014-01-24 MED ORDER — FENTANYL CITRATE 0.05 MG/ML IJ SOLN
INTRAMUSCULAR | Status: DC | PRN
  Administered 2014-01-24: 50 ug via INTRAVENOUS

## 2014-01-24 MED ORDER — PROPOFOL 10 MG/ML IV BOLUS
INTRAVENOUS | Status: DC | PRN
  Administered 2014-01-24: 150 mg via INTRAVENOUS

## 2014-01-24 MED ORDER — ARTIFICIAL TEARS OP OINT
TOPICAL_OINTMENT | OPHTHALMIC | Status: DC | PRN
  Administered 2014-01-24: 1 via OPHTHALMIC

## 2014-01-24 MED ORDER — NEOSTIGMINE METHYLSULFATE 1 MG/ML IJ SOLN
INTRAMUSCULAR | Status: DC | PRN
  Administered 2014-01-24: 4 mg via INTRAVENOUS

## 2014-01-24 MED ORDER — HYDROMORPHONE HCL PF 1 MG/ML IJ SOLN
INTRAMUSCULAR | Status: AC
  Administered 2014-01-24: 0.5 mg via INTRAVENOUS
  Filled 2014-01-24: qty 1

## 2014-01-24 MED ORDER — FENTANYL CITRATE 0.05 MG/ML IJ SOLN
INTRAMUSCULAR | Status: AC
  Filled 2014-01-24: qty 5

## 2014-01-24 MED ORDER — ROCURONIUM BROMIDE 100 MG/10ML IV SOLN
INTRAVENOUS | Status: DC | PRN
  Administered 2014-01-24: 20 mg via INTRAVENOUS

## 2014-01-24 MED ORDER — PROMETHAZINE HCL 25 MG/ML IJ SOLN: 6.2500 mg | INTRAMUSCULAR | Status: DC | PRN

## 2014-01-24 MED ORDER — HYDROMORPHONE HCL PF 1 MG/ML IJ SOLN
0.2500 mg | INTRAMUSCULAR | Status: DC | PRN
  Administered 2014-01-24 (×4): 0.5 mg via INTRAVENOUS

## 2014-01-24 MED ORDER — LIDOCAINE HCL (CARDIAC) 20 MG/ML IV SOLN
INTRAVENOUS | Status: AC
  Filled 2014-01-24: qty 5

## 2014-01-24 MED ORDER — MIDAZOLAM HCL 2 MG/2ML IJ SOLN
INTRAMUSCULAR | Status: AC
  Filled 2014-01-24: qty 2

## 2014-01-24 MED ORDER — MIDAZOLAM HCL 5 MG/5ML IJ SOLN
INTRAMUSCULAR | Status: DC | PRN
  Administered 2014-01-24: 1 mg via INTRAVENOUS

## 2014-01-24 MED ORDER — ARTIFICIAL TEARS OP OINT
TOPICAL_OINTMENT | OPHTHALMIC | Status: AC
  Filled 2014-01-24: qty 3.5

## 2014-01-24 MED ORDER — EPINEPHRINE HCL (NASAL) 0.1 % NA SOLN
NASAL | Status: DC | PRN
  Administered 2014-01-24: 30 mL via NASAL

## 2014-01-24 MED ORDER — ONDANSETRON HCL 4 MG/2ML IJ SOLN
INTRAMUSCULAR | Status: DC | PRN
  Administered 2014-01-24: 4 mg via INTRAVENOUS

## 2014-01-24 MED ORDER — NEOSTIGMINE METHYLSULFATE 1 MG/ML IJ SOLN
INTRAMUSCULAR | Status: AC
  Filled 2014-01-24: qty 10

## 2014-01-24 MED ORDER — DEXAMETHASONE SODIUM PHOSPHATE 10 MG/ML IJ SOLN
INTRAMUSCULAR | Status: AC
  Filled 2014-01-24: qty 1

## 2014-01-24 MED ORDER — GLYCOPYRROLATE 0.2 MG/ML IJ SOLN
INTRAMUSCULAR | Status: DC | PRN
  Administered 2014-01-24: 0.6 mg via INTRAVENOUS

## 2014-01-24 MED ORDER — ONDANSETRON HCL 4 MG/2ML IJ SOLN
INTRAMUSCULAR | Status: AC
  Filled 2014-01-24: qty 2

## 2014-01-24 MED ORDER — GLYCOPYRROLATE 0.2 MG/ML IJ SOLN
INTRAMUSCULAR | Status: AC
  Filled 2014-01-24: qty 2

## 2014-01-24 MED ORDER — 0.9 % SODIUM CHLORIDE (POUR BTL) OPTIME
TOPICAL | Status: DC | PRN
  Administered 2014-01-24: 1000 mL

## 2014-01-24 MED ORDER — ROCURONIUM BROMIDE 50 MG/5ML IV SOLN
INTRAVENOUS | Status: AC
  Filled 2014-01-24: qty 1

## 2014-01-24 SURGICAL SUPPLY — 35 items
BALLN PULM 15 16.5 18 X 75CM (BALLOONS)
BALLN PULM 15 16.5 18X75 (BALLOONS)
BALLOON PULM 15 16.5 18X75 (BALLOONS) IMPLANT
CANISTER SUCTION 2500CC (MISCELLANEOUS) ×3 IMPLANT
CONT SPEC 4OZ CLIKSEAL STRL BL (MISCELLANEOUS) ×3 IMPLANT
COVER MAYO STAND STRL (DRAPES) ×3 IMPLANT
COVER TABLE BACK 60X90 (DRAPES) ×3 IMPLANT
CRADLE DONUT ADULT HEAD (MISCELLANEOUS) ×3 IMPLANT
DRAPE PROXIMA HALF (DRAPES) ×3 IMPLANT
GAUZE SPONGE 4X4 16PLY XRAY LF (GAUZE/BANDAGES/DRESSINGS) ×3 IMPLANT
GLOVE BIO SURGEON STRL SZ7.5 (GLOVE) ×3 IMPLANT
GLOVE BIOGEL PI IND STRL 6.5 (GLOVE) ×1 IMPLANT
GLOVE BIOGEL PI INDICATOR 6.5 (GLOVE) ×2
GLOVE SURG SS PI 6.5 STRL IVOR (GLOVE) ×3 IMPLANT
GOWN STRL REUS W/ TWL LRG LVL3 (GOWN DISPOSABLE) ×2 IMPLANT
GOWN STRL REUS W/TWL LRG LVL3 (GOWN DISPOSABLE) ×4
GUARD TEETH (MISCELLANEOUS) ×3 IMPLANT
KIT BASIN OR (CUSTOM PROCEDURE TRAY) ×3 IMPLANT
KIT ROOM TURNOVER OR (KITS) ×3 IMPLANT
MARKER SKIN DUAL TIP RULER LAB (MISCELLANEOUS) ×3 IMPLANT
NEEDLE TRANS ORAL INJECTION (NEEDLE) ×3 IMPLANT
NS IRRIG 1000ML POUR BTL (IV SOLUTION) ×3 IMPLANT
PAD ARMBOARD 7.5X6 YLW CONV (MISCELLANEOUS) ×6 IMPLANT
PAD EYE OVAL STERILE LF (GAUZE/BANDAGES/DRESSINGS) IMPLANT
PATTIES SURGICAL .5 X1 (DISPOSABLE) ×3 IMPLANT
PATTIES SURGICAL .5 X3 (DISPOSABLE) IMPLANT
SOLUTION ANTI FOG 6CC (MISCELLANEOUS) ×3 IMPLANT
SPONGE GAUZE 4X4 12PLY (GAUZE/BANDAGES/DRESSINGS) ×3 IMPLANT
STAPLER TA60 3.5 010317 (STAPLE) IMPLANT
SUT SILK 2 0 FS (SUTURE) ×3 IMPLANT
SUT SILK 2 0 SH (SUTURE) IMPLANT
TOWEL OR 17X24 6PK STRL BLUE (TOWEL DISPOSABLE) ×6 IMPLANT
TUBE CONNECTING 12'X1/4 (SUCTIONS) ×1
TUBE CONNECTING 12X1/4 (SUCTIONS) ×2 IMPLANT
WATER STERILE IRR 1000ML POUR (IV SOLUTION) ×3 IMPLANT

## 2014-01-24 NOTE — OR Nursing (Signed)
[facility].

## 2014-01-24 NOTE — Anesthesia Postprocedure Evaluation (Signed)
  Anesthesia Post-op Note  Patient: Whitney Marks  Procedure(s) Performed: Procedure(s) with comments: MICRO LARYNGOSCOPY (N/A) - MICRO DIRECT LARYNGOSCOPY  Patient Location: PACU  Anesthesia Type:General  Level of Consciousness: awake and alert   Airway and Oxygen Therapy: Patient Spontanous Breathing  Post-op Pain: mild  Post-op Assessment: Post-op Vital signs reviewed  Post-op Vital Signs: stable  Complications: No apparent anesthesia complications

## 2014-01-24 NOTE — Transfer of Care (Signed)
Immediate Anesthesia Transfer of Care Note  Patient: Whitney Marks  Procedure(s) Performed: Procedure(s) with comments: MICRO LARYNGOSCOPY (N/A) - MICRO DIRECT LARYNGOSCOPY  Patient Location: PACU  Anesthesia Type:General  Level of Consciousness: awake, alert  and oriented  Airway & Oxygen Therapy: Patient Spontanous Breathing and Patient connected to tracheostomy mask oxygen  Post-op Assessment: Report given to PACU RN  Post vital signs: Reviewed and stable  Complications: No apparent anesthesia complications

## 2014-01-24 NOTE — Anesthesia Preprocedure Evaluation (Addendum)
Anesthesia Evaluation  Patient identified by MRN, date of birth, ID band  Airway       Dental   Pulmonary shortness of breath, sleep apnea , pneumonia -, Recent URI ,  Tracheal stenosis , emerg trach performed + rhonchi         Cardiovascular Rhythm:Regular Rate:Tachycardia     Neuro/Psych  Headaches,    GI/Hepatic   Endo/Other    Renal/GU      Musculoskeletal  (+) Fibromyalgia -  Abdominal   Peds  Hematology   Anesthesia Other Findings   Reproductive/Obstetrics                          Anesthesia Physical Anesthesia Plan  ASA: IV  Anesthesia Plan: General   Post-op Pain Management:    Induction: Intravenous  Airway Management Planned: Oral ETT  Additional Equipment:   Intra-op Plan:   Post-operative Plan: Post-operative intubation/ventilation  Informed Consent: I have reviewed the patients History and Physical, chart, labs and discussed the procedure including the risks, benefits and alternatives for the proposed anesthesia with the patient or authorized representative who has indicated his/her understanding and acceptance.   Dental advisory given  Plan Discussed with: CRNA and Surgeon  Anesthesia Plan Comments:         Anesthesia Quick Evaluation

## 2014-01-24 NOTE — Progress Notes (Signed)
PULMONARY / CRITICAL CARE MEDICINE   Name: Whitney Marks MRN: [id number] DOB: 12-17-1960    ADMISSION DATE:  01/24/2014 CONSULTATION DATE:3/20  REFERRING MD : [facility] PRIMARY SERVICE: [facility]  CHIEF COMPLAINT: Tracheal stenosis  BRIEF PATIENT DESCRIPTION:  53 y.o. y/o female with a PMH of OSA, previous trach/ tracheal stenosis, multiple previous admissions for aspiration PNA, ARDS, opioid addiction admitted 3/12 with worsening cough, SOB and fever. Admitted with acute on chronic respiratory failure, aspiration PNA and sepsis. She initially refused intubation, CVL placement. She was treated with abx and fluids. She deteriorated quickly requiring emergent intubation 3/12. She weaned well and was extubated 3/18, but developed immediate stridor, desat and near arrest, ultimately requiring emergent bedside trach. ENT has evaluated, plans as below. She is hemodynamically stable, tolerating some PS wean and ready for d/c to select for further weaning and rehab efforts.   SIGNIFICANT EVENTS / STUDIES:  3/12 ETT placed  3/16- off pressors  3/17- bronch images without sig stenosis  3/18>> post extubation developed immediate stridor, rapid desat, near arrest. Required emergent trach.  3/19 - laryngoscopy>> scar narrowing the nasopharynx, more from the right side. The larynx is abnormal without clearly seen true vocal folds.  3/19 x to [facility] 3/28- ENT to OR, evaluated glottis - LARYNGEAL STENOSIS   LINES / TUBES: ETT 01/09/14 >> 3/18  Trach (DF, Emergent) 3/18>>>  R IJ CVL 3/12>>>   CULTURES: 3/12 Blood >>> neg 3/12 Urine >> neg 3/12 Sputum >>>strep pna  01/09/14 MRSA PCr - POSITIVE  3/12 BAL - strep, H flu  .Marland Kitchen  3/15 - tracheal aspireate resp virus multiplex >>Meta pneumoviris   ANTIBIOTICS: Per ssh  SUBJECTIVE:  No distress  VITAL SIGNS: reviewed  Temp:  [98.5 F (36.9 C)-98.6 F (37 C)] 98.5 F (36.9 C) (03/27 1230) Pulse Rate:  [76-87] 76 (03/27 1230) Resp:  [11-23] 23 (03/27  1230) BP: (93-131)/(60-82) 93/66 mmHg (03/27 1230) SpO2:  [97 %-100 %] 100 % (03/27 1230) FiO2 (%):  [30 %] 30 % (03/27 1215) HEMODYNAMICS:   VENTILATOR SETTINGS: Vent Mode:  [-]  FiO2 (%):  [30 %] 30 % INTAKE / OUTPUT: Intake/Output   None     PHYSICAL EXAMINATION: General:  Thin white female comfortable  Neuro: intact, talking well HEENT:  Trach in place, hoarse phonation w/ PMV, but improved actually daily Cardiovascular:  HSR RRR Lungs: mild end exp wheezing Abdomen:  +bs Musculoskeletal: intact Skin:  Warm dry  LABS:  CBC  Recent Labs Lab 01/18/14 1050 01/20/14 0500 01/23/14 0500  WBC 13.1* 10.3 8.6  HGB 12.0 10.6* 10.1*  HCT 36.5 32.5* 32.2*  PLT 677* 583* 534*   Coag's  Recent Labs Lab 01/18/14 1050  INR 1.22   BMET  Recent Labs Lab 01/20/14 0500 01/21/14 0500 01/23/14 0500  NA 136* 137 140  K 4.1 4.8 4.6  CL 90* 94* 96  CO2 29 31 32  BUN 73* 59* 34*  CREATININE 3.41* 1.43* 1.06  GLUCOSE 98 138* 92   Electrolytes  Recent Labs Lab 01/20/14 0500 01/21/14 0500 01/23/14 0500  CALCIUM 8.6 9.3 9.6   Sepsis Markers No results found for this basename: LATICACIDVEN, PROCALCITON, O2SATVEN,  in the last 168 hours ABG No results found for this basename: PHART, PCO2ART, PO2ART,  in the last 168 hours Liver Enzymes No results found for this basename: AST, ALT, ALKPHOS, BILITOT, ALBUMIN,  in the last 168 hours Cardiac Enzymes No results found for this basename: TROPONINI, PROBNP,  in the last  168 hours Glucose No results found for this basename: GLUCAP,  in the last 168 hours  Imaging No results found. .    ASSESSMENT Active Problems:   ARDS (adult respiratory distress syndrome)   CAP (community acquired pneumonia)   Aspiration pneumonia   Chronic pain due to trauma   Tracheal stenosis following tracheostomy   Acute respiratory failure    PLAN:    PULMONARY A: VDRF in 53 yo female with a history of tracheal stenosis . OSA.  Aspiration Pna, Acute airway obstruction post extubation that required emergent tracheostomy 3/18.  Her primary issue is airway obstruction above the tracheostomy insertion site. P:   - dysphagia diet  - cont PMV  - have d/w Dr Redmond Baseman results and his recommendations -cap progressive over weekend, orders placed 2-8 hr daytime cap this weekend, none at night -if successful for 12 hrs then can do 24 hrs, then contact Dr Redmond Baseman to discuss possible decannulation -will order pcxr Monday for atx -I wonder if after 24 hr cap we shold place a plug in fistula trach for 48 hrs ? Will return [place]. Titus Mould, MD, [place] Pgr: [place] [facility]

## 2014-01-24 NOTE — Preoperative (Signed)
Beta Blockers   Reason not to administer Beta Blockers:Not Applicable 
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
## 2014-01-24 NOTE — Brief Op Note (Signed)
01/16/2014 - 01/24/2014  11:25 AM  PATIENT:  Whitney Marks  53 y.o. female  PRE-OPERATIVE DIAGNOSIS:  LARYNGEAL STENOSIS, TRACHEOSTOMY  POST-OPERATIVE DIAGNOSIS:  LARYNGEAL STENOSIS, TRACHEOSTOMY  PROCEDURE:  Procedure(s) with comments: MICRO LARYNGOSCOPY (N/A) - MICRO DIRECT LARYNGOSCOPY  SURGEON:  Surgeon(s) and Role:    * Melida Quitter, MD - Primary  PHYSICIAN ASSISTANT:   ASSISTANTS: none   ANESTHESIA:   general  EBL:     BLOOD ADMINISTERED:none  DRAINS: none   LOCAL MEDICATIONS USED:  NONE  SPECIMEN:  No Specimen  DISPOSITION OF SPECIMEN:  N/A  COUNTS:  YES  TOURNIQUET:  * No tourniquets in log *  DICTATION: .Other Dictation: Dictation Number [id number]  PLAN OF CARE: Return to [facility] after recovery  PATIENT DISPOSITION:  PACU - hemodynamically stable.   Delay start of Pharmacological VTE agent (>24hrs) due to surgical blood loss or risk of bleeding: no
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
## 2014-01-26 ENCOUNTER — Other Ambulatory Visit (HOSPITAL_COMMUNITY): Payer: Self-pay

## 2014-01-26 LAB — BASIC METABOLIC PANEL
BUN: 24 mg/dL — ABNORMAL HIGH (ref 6–23)
CALCIUM: 9.4 mg/dL (ref 8.4–10.5)
CO2: 32 mEq/L (ref 19–32)
Chloride: 97 mEq/L (ref 96–112)
Creatinine, Ser: 0.94 mg/dL (ref 0.50–1.10)
GFR calc Af Amer: 79 mL/min — ABNORMAL LOW (ref >90)
GFR, EST NON AFRICAN AMERICAN: 69 mL/min — AB (ref >90)
Glucose, Bld: 85 mg/dL (ref 70–99)
Potassium: 4.4 mEq/L (ref 3.7–5.3)
SODIUM: 139 meq/L (ref 137–147)

## 2014-01-26 LAB — CBC WITH DIFFERENTIAL/PLATELET
Basophils Absolute: 0.1 10*3/uL (ref 0.0–0.1)
Basophils Relative: 1 % (ref 0–1)
EOS ABS: 0.3 10*3/uL (ref 0.0–0.7)
EOS PCT: 4 % (ref 0–5)
HCT: 29.7 % — ABNORMAL LOW (ref 36.0–46.0)
Hemoglobin: 9.5 g/dL — ABNORMAL LOW (ref 12.0–15.0)
Lymphocytes Relative: 50 % — ABNORMAL HIGH (ref 12–46)
Lymphs Abs: 3.9 10*3/uL (ref 0.7–4.0)
MCH: 29.1 pg (ref 26.0–34.0)
MCHC: 32 g/dL (ref 30.0–36.0)
MCV: 90.8 fL (ref 78.0–100.0)
Monocytes Absolute: 0.7 10*3/uL (ref 0.1–1.0)
Monocytes Relative: 9 % (ref 3–12)
NEUTROS PCT: 37 % — AB (ref 43–77)
Neutro Abs: 2.8 10*3/uL (ref 1.7–7.7)
Platelets: 470 10*3/uL — ABNORMAL HIGH (ref 150–400)
RBC: 3.27 MIL/uL — ABNORMAL LOW (ref 3.87–5.11)
RDW: 13.9 % (ref 11.5–15.5)
WBC: 7.7 10*3/uL (ref 4.0–10.5)

## 2014-01-27 ENCOUNTER — Other Ambulatory Visit (HOSPITAL_COMMUNITY): Payer: Medicare Other

## 2014-01-27 DIAGNOSIS — J9503 Malfunction of tracheostomy stoma: Secondary | ICD-10-CM

## 2014-01-27 LAB — BASIC METABOLIC PANEL
BUN: 31 mg/dL — ABNORMAL HIGH (ref 6–23)
CO2: 30 mEq/L (ref 19–32)
Calcium: 9.6 mg/dL (ref 8.4–10.5)
Chloride: 99 mEq/L (ref 96–112)
Creatinine, Ser: 1.19 mg/dL — ABNORMAL HIGH (ref 0.50–1.10)
GFR, EST AFRICAN AMERICAN: 60 mL/min — AB (ref >90)
GFR, EST NON AFRICAN AMERICAN: 52 mL/min — AB (ref >90)
Glucose, Bld: 76 mg/dL (ref 70–99)
POTASSIUM: 4.6 meq/L (ref 3.7–5.3)
SODIUM: 140 meq/L (ref 137–147)

## 2014-01-27 LAB — CBC
HCT: 31.8 % — ABNORMAL LOW (ref 36.0–46.0)
Hemoglobin: 10.1 g/dL — ABNORMAL LOW (ref 12.0–15.0)
MCH: 29 pg (ref 26.0–34.0)
MCHC: 31.8 g/dL (ref 30.0–36.0)
MCV: 91.4 fL (ref 78.0–100.0)
PLATELETS: 447 10*3/uL — AB (ref 150–400)
RBC: 3.48 MIL/uL — AB (ref 3.87–5.11)
RDW: 13.9 % (ref 11.5–15.5)
WBC: 8.4 10*3/uL (ref 4.0–10.5)

## 2014-01-27 LAB — PREALBUMIN: Prealbumin: 23.7 mg/dL (ref 17.0–34.0)

## 2014-01-27 NOTE — Progress Notes (Signed)
PULMONARY / CRITICAL CARE MEDICINE   Name: Whitney Marks MRN: [id number] DOB: 26-Apr-1961    ADMISSION DATE:  01/24/2014 CONSULTATION DATE:3/20  REFERRING MD : [facility] PRIMARY SERVICE: [facility]  CHIEF COMPLAINT: Tracheal stenosis  BRIEF PATIENT DESCRIPTION:  53 y.o. y/o female with a PMH of OSA, previous trach/ tracheal stenosis, multiple previous admissions for aspiration PNA, ARDS, opioid addiction admitted 3/12 with worsening cough, SOB and fever. Admitted with acute on chronic respiratory failure, aspiration PNA and sepsis. She initially refused intubation, CVL placement. She was treated with abx and fluids. She deteriorated quickly requiring emergent intubation 3/12. She weaned well and was extubated 3/18, but developed immediate stridor, desat and near arrest, ultimately requiring emergent bedside trach. ENT has evaluated, plans as below. She is hemodynamically stable, tolerating some PS wean and ready for d/c to [facility] for further weaning and rehab efforts.   SIGNIFICANT EVENTS / STUDIES:  3/12 ETT placed  3/16- off pressors  3/17- bronch images without sig stenosis  3/18>> post extubation developed immediate stridor, rapid desat, near arrest. Required emergent trach.  3/19 - laryngoscopy>> scar narrowing the nasopharynx, more from the right side. The larynx is abnormal without clearly seen true vocal folds.  3/19 x to [facility] 3/28- ENT to OR, evaluated glottis - LARYNGEAL STENOSIS  LINES / TUBES: ETT 01/09/14 >> 3/18  Trach (DF, Emergent) 3/18>>>  R IJ CVL 3/12>>>  CULTURES: 3/12 Blood >>> neg 3/12 Urine >> neg 3/12 Sputum >>>strep pna  01/09/14 MRSA PCr - POSITIVE  3/12 BAL - strep, H flu  3/15 - tracheal aspireate resp virus multiplex >>Meta pneumoviris  SUBJECTIVE:   VITAL SIGNS:  Reviewed  PHYSICAL EXAMINATION: General:  Thin white female comfortable  Neuro: intact, talking well HEENT:  Trach in place, hoarse phonation w/ PMV, but improved actually daily Cardiovascular:   HSR RRR Lungs: mild end exp wheezing Abdomen:  +bs Musculoskeletal: intact Skin:  Warm dry  LABS:  CBC  Recent Labs Lab 01/23/14 0500 01/26/14 0600 01/27/14 0500  WBC 8.6 7.7 8.4  HGB 10.1* 9.5* 10.1*  HCT 32.2* 29.7* 31.8*  PLT 534* 470* 447*   Coag's No results found for this basename: APTT, INR,  in the last 168 hours  BMET  Recent Labs Lab 01/23/14 0500 01/26/14 0600 01/27/14 0500  NA 140 139 140  K 4.6 4.4 4.6  CL 96 97 99  CO2 32 32 30  BUN 34* 24* 31*  CREATININE 1.06 0.94 1.19*  GLUCOSE 92 85 76   Electrolytes  Recent Labs Lab 01/23/14 0500 01/26/14 0600 01/27/14 0500  CALCIUM 9.6 9.4 9.6   IMAGING:  Dg Chest Port 1 View  01/27/2014   CLINICAL DATA:  Tracheostomy, shortness of breath  EXAM: PORTABLE CHEST - 1 VIEW  COMPARISON:  01/26/2014.  FINDINGS: Left IJ central line to the proximal SVC level. Tracheostomy in the mid upper trachea as before. Normal heart size and vascularity. Similar streaky right base opacity partially obscures the right hemidiaphragm, compatible with atelectasis versus mild pneumonia. Interval improvement in the right upper lobe aeration. No enlarging effusion or pneumothorax.  IMPRESSION: Similar streaky right base opacity compatible with atelectasis versus pneumonia.   Electronically Signed   By: Daryll Brod M.D.   On: 01/27/2014 07:49   Dg Chest Port 1 View  01/26/2014   CLINICAL DATA:  Status post tracheal dilatation.  EXAM: PORTABLE CHEST - 1 VIEW  COMPARISON:  01/20/2014  FINDINGS: Heart is normal size. Tracheostomy tube and left central line are  unchanged. Right scratch head patchy right upper lobe and right basilar airspace opacities, increased since prior study. No confluent opacity on the left. No effusions. Underlying hyperinflation.  IMPRESSION: Patchy airspace disease in the right upper lobe and right lung base. Right upper lobe opacity is concerning for pneumonia. Right basilar opacity could reflect pneumonia or  atelectasis.  Hyperinflation   Electronically Signed   By: Rolm Baptise M.D.   On: 01/26/2014 07:53   ASSESSMENT AND PLAN:  Acute respiratory failure, resolved Aspiration pneumonia Tracheal stenosis Tracheostomy status   Continue PMV trials  Cap trach for 24 hours  Will discuss with Dr. Redmond Baseman if tolerates capping x 24 hours  Personally would recommend leaving tracheostomy tube in as she had tracheostomy twice and at high risk  for recurrent aspiration and respiratory failure  Will have to get the patient involved in discussing decannulation as either way there are risks involved  I have personally obtained history, examined patient, evaluated and interpreted laboratory and imaging results, reviewed medical records, formulated assessment / plan and placed orders.  Doree Fudge, MD Pulmonary and [place] Pager: [phone]  01/27/2014, 3:22 PM

## 2014-01-28 ENCOUNTER — Encounter (HOSPITAL_COMMUNITY): Payer: Self-pay | Admitting: Otolaryngology

## 2014-01-28 LAB — CBC WITH DIFFERENTIAL/PLATELET
Basophils Absolute: 0.1 10*3/uL (ref 0.0–0.1)
Basophils Relative: 1 % (ref 0–1)
Eosinophils Absolute: 0.3 10*3/uL (ref 0.0–0.7)
Eosinophils Relative: 3 % (ref 0–5)
HEMATOCRIT: 34.1 % — AB (ref 36.0–46.0)
Hemoglobin: 11.3 g/dL — ABNORMAL LOW (ref 12.0–15.0)
LYMPHS ABS: 2.5 10*3/uL (ref 0.7–4.0)
Lymphocytes Relative: 29 % (ref 12–46)
MCH: 29.7 pg (ref 26.0–34.0)
MCHC: 33.1 g/dL (ref 30.0–36.0)
MCV: 89.7 fL (ref 78.0–100.0)
MONOS PCT: 9 % (ref 3–12)
Monocytes Absolute: 0.8 10*3/uL (ref 0.1–1.0)
Neutro Abs: 4.9 10*3/uL (ref 1.7–7.7)
Neutrophils Relative %: 58 % (ref 43–77)
PLATELETS: 439 10*3/uL — AB (ref 150–400)
RBC: 3.8 MIL/uL — ABNORMAL LOW (ref 3.87–5.11)
RDW: 13.9 % (ref 11.5–15.5)
WBC: 8.6 10*3/uL (ref 4.0–10.5)

## 2014-01-28 LAB — BASIC METABOLIC PANEL
BUN: 33 mg/dL — ABNORMAL HIGH (ref 6–23)
CALCIUM: 9.2 mg/dL (ref 8.4–10.5)
CHLORIDE: 94 meq/L — AB (ref 96–112)
CO2: 29 meq/L (ref 19–32)
Creatinine, Ser: 1.29 mg/dL — ABNORMAL HIGH (ref 0.50–1.10)
GFR calc Af Amer: 54 mL/min — ABNORMAL LOW (ref >90)
GFR calc non Af Amer: 47 mL/min — ABNORMAL LOW (ref >90)
Glucose, Bld: 100 mg/dL — ABNORMAL HIGH (ref 70–99)
Potassium: 4.8 mEq/L (ref 3.7–5.3)
SODIUM: 136 meq/L — AB (ref 137–147)
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
## 2014-02-02 LAB — BASIC METABOLIC PANEL
BUN: 21 mg/dL (ref 6–23)
CHLORIDE: 98 meq/L (ref 96–112)
CO2: 26 meq/L (ref 19–32)
CREATININE: 1.04 mg/dL (ref 0.50–1.10)
Calcium: 9.4 mg/dL (ref 8.4–10.5)
GFR calc Af Amer: 70 mL/min — ABNORMAL LOW (ref >90)
GFR calc non Af Amer: 61 mL/min — ABNORMAL LOW (ref >90)
Glucose, Bld: 113 mg/dL — ABNORMAL HIGH (ref 70–99)
Potassium: 4.6 mEq/L (ref 3.7–5.3)
Sodium: 138 mEq/L (ref 137–147)

## 2014-02-03 LAB — CBC
HEMATOCRIT: 31.7 % — AB (ref 36.0–46.0)
HEMOGLOBIN: 10.2 g/dL — AB (ref 12.0–15.0)
MCH: 29.5 pg (ref 26.0–34.0)
MCHC: 32.2 g/dL (ref 30.0–36.0)
MCV: 91.6 fL (ref 78.0–100.0)
Platelets: 311 10*3/uL (ref 150–400)
RBC: 3.46 MIL/uL — AB (ref 3.87–5.11)
RDW: 15.3 % (ref 11.5–15.5)
WBC: 7.7 10*3/uL (ref 4.0–10.5)

## 2014-02-03 LAB — PREALBUMIN: Prealbumin: 20.2 mg/dL (ref 17.0–34.0)
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
## 2014-02-16 ENCOUNTER — Other Ambulatory Visit: Payer: Self-pay | Admitting: Otolaryngology

## 2014-02-16 DIAGNOSIS — Z430 Encounter for attention to tracheostomy: Secondary | ICD-10-CM | POA: Insufficient documentation

## 2014-02-16 NOTE — ED Notes (Addendum)
Pt was seen by a physician because her trach canula came out. Physician can to ED triage put canula back in and then stated that patient did not need to be seen in E.D. Pt needed stickers and placed in EPIC for charting purposes for physician. MD [facility].
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
## 2014-05-20 ENCOUNTER — Other Ambulatory Visit: Payer: Self-pay

## 2014-05-20 ENCOUNTER — Emergency Department (HOSPITAL_COMMUNITY): Payer: Medicare Other

## 2014-05-20 ENCOUNTER — Encounter (HOSPITAL_COMMUNITY): Payer: Self-pay | Admitting: Emergency Medicine

## 2014-05-20 ENCOUNTER — Emergency Department (HOSPITAL_COMMUNITY)
Admission: EM | Admit: 2014-05-20 | Discharge: 2014-05-20 | Disposition: A | Discharge status: Home / Self Care | Payer: Medicare Other | Attending: Emergency Medicine | Admitting: Emergency Medicine

## 2014-05-20 DIAGNOSIS — H539 Unspecified visual disturbance: Secondary | ICD-10-CM | POA: Insufficient documentation

## 2014-05-20 DIAGNOSIS — R0789 Other chest pain: Secondary | ICD-10-CM | POA: Diagnosis not present

## 2014-05-20 DIAGNOSIS — R51 Headache: Secondary | ICD-10-CM | POA: Insufficient documentation

## 2014-05-20 DIAGNOSIS — F329 Major depressive disorder, single episode, unspecified: Secondary | ICD-10-CM | POA: Diagnosis not present

## 2014-05-20 DIAGNOSIS — Z79899 Other long term (current) drug therapy: Secondary | ICD-10-CM | POA: Diagnosis not present

## 2014-05-20 DIAGNOSIS — F411 Generalized anxiety disorder: Secondary | ICD-10-CM | POA: Insufficient documentation

## 2014-05-20 DIAGNOSIS — R6883 Chills (without fever): Secondary | ICD-10-CM | POA: Diagnosis not present

## 2014-05-20 DIAGNOSIS — M129 Arthropathy, unspecified: Secondary | ICD-10-CM | POA: Insufficient documentation

## 2014-05-20 DIAGNOSIS — R05 Cough: Secondary | ICD-10-CM | POA: Diagnosis not present

## 2014-05-20 DIAGNOSIS — M545 Low back pain, unspecified: Secondary | ICD-10-CM | POA: Insufficient documentation

## 2014-05-20 DIAGNOSIS — M542 Cervicalgia: Secondary | ICD-10-CM | POA: Insufficient documentation

## 2014-05-20 DIAGNOSIS — J3489 Other specified disorders of nose and nasal sinuses: Secondary | ICD-10-CM | POA: Insufficient documentation

## 2014-05-20 DIAGNOSIS — Z8701 Personal history of pneumonia (recurrent): Secondary | ICD-10-CM | POA: Diagnosis not present

## 2014-05-20 DIAGNOSIS — M546 Pain in thoracic spine: Secondary | ICD-10-CM | POA: Diagnosis not present

## 2014-05-20 DIAGNOSIS — Z9889 Other specified postprocedural states: Secondary | ICD-10-CM | POA: Insufficient documentation

## 2014-05-20 DIAGNOSIS — Z8541 Personal history of malignant neoplasm of cervix uteri: Secondary | ICD-10-CM | POA: Diagnosis not present

## 2014-05-20 DIAGNOSIS — R42 Dizziness and giddiness: Secondary | ICD-10-CM | POA: Insufficient documentation

## 2014-05-20 DIAGNOSIS — R059 Cough, unspecified: Secondary | ICD-10-CM | POA: Insufficient documentation

## 2014-05-20 DIAGNOSIS — F3289 Other specified depressive episodes: Secondary | ICD-10-CM | POA: Insufficient documentation

## 2014-05-20 DIAGNOSIS — G8929 Other chronic pain: Secondary | ICD-10-CM | POA: Insufficient documentation

## 2014-05-20 DIAGNOSIS — R079 Chest pain, unspecified: Secondary | ICD-10-CM | POA: Diagnosis present

## 2014-05-20 LAB — BASIC METABOLIC PANEL
Anion gap: 17 — ABNORMAL HIGH (ref 5–15)
BUN: 14 mg/dL (ref 6–23)
CALCIUM: 9.1 mg/dL (ref 8.4–10.5)
CO2: 24 meq/L (ref 19–32)
CREATININE: 0.82 mg/dL (ref 0.50–1.10)
Chloride: 102 mEq/L (ref 96–112)
GFR calc Af Amer: >90 mL/min (ref >90)
GFR calc non Af Amer: 81 mL/min — ABNORMAL LOW (ref >90)
GLUCOSE: 87 mg/dL (ref 70–99)
Potassium: 3.4 mEq/L — ABNORMAL LOW (ref 3.7–5.3)
SODIUM: 143 meq/L (ref 137–147)

## 2014-05-20 LAB — CBC
HEMATOCRIT: 38.8 % (ref 36.0–46.0)
HEMOGLOBIN: 13.2 g/dL (ref 12.0–15.0)
MCH: 30.7 pg (ref 26.0–34.0)
MCHC: 34 g/dL (ref 30.0–36.0)
MCV: 90.2 fL (ref 78.0–100.0)
Platelets: 397 10*3/uL (ref 150–400)
RBC: 4.3 MIL/uL (ref 3.87–5.11)
RDW: 14 % (ref 11.5–15.5)
WBC: 6.6 10*3/uL (ref 4.0–10.5)

## 2014-05-20 LAB — I-STAT TROPONIN, ED: TROPONIN I, POC: 0 ng/mL (ref 0.00–0.08)

## 2014-05-20 LAB — D-DIMER, QUANTITATIVE: D-Dimer, Quant: <0.27 ug/mL-FEU (ref 0.00–0.48)

## 2014-05-20 MED ORDER — PANTOPRAZOLE SODIUM 40 MG IV SOLR
40.0000 mg | Freq: Once | INTRAVENOUS | Status: AC
  Administered 2014-05-20: 40 mg via INTRAVENOUS
  Filled 2014-05-20: qty 40

## 2014-05-20 MED ORDER — ESOMEPRAZOLE MAGNESIUM 40 MG PO CPDR: 40.0000 mg | DELAYED_RELEASE_CAPSULE | Freq: Every day | ORAL | Status: DC

## 2014-05-20 MED ORDER — OXYCODONE-ACETAMINOPHEN 5-325 MG PO TABS
1.0000 | ORAL_TABLET | Freq: Once | ORAL | Status: AC
  Administered 2014-05-20: 1 via ORAL
  Filled 2014-05-20: qty 1

## 2014-05-20 MED ORDER — MORPHINE SULFATE 4 MG/ML IJ SOLN
4.0000 mg | Freq: Once | INTRAMUSCULAR | Status: AC
  Administered 2014-05-20: 4 mg via INTRAVENOUS
  Filled 2014-05-20: qty 1

## 2014-05-20 MED ORDER — HYDROCODONE-ACETAMINOPHEN 5-325 MG PO TABS
1.0000 | ORAL_TABLET | Freq: Four times a day (QID) | ORAL | Status: DC | PRN
Start: 2014-05-20 — End: 2015-01-29

## 2014-05-20 MED ORDER — GI COCKTAIL ~~LOC~~
30.0000 mL | Freq: Once | ORAL | Status: AC
  Administered 2014-05-20: 30 mL via ORAL
  Filled 2014-05-20: qty 30

## 2014-05-20 MED ORDER — ONDANSETRON HCL 4 MG/2ML IJ SOLN
4.0000 mg | Freq: Once | INTRAMUSCULAR | Status: AC
  Administered 2014-05-20: 4 mg via INTRAVENOUS
  Filled 2014-05-20: qty 2

## 2014-05-20 MED ORDER — ACETAMINOPHEN 325 MG PO TABS
650.0000 mg | ORAL_TABLET | Freq: Once | ORAL | Status: DC
  Filled 2014-05-20: qty 2

## 2014-05-20 MED ORDER — DIPHENHYDRAMINE HCL 50 MG/ML IJ SOLN
25.0000 mg | Freq: Once | INTRAMUSCULAR | Status: AC
  Administered 2014-05-20: 25 mg via INTRAVENOUS
  Filled 2014-05-20: qty 1

## 2014-05-20 NOTE — ED Notes (Signed)
Pt continues to be monitored by 12 lead, blood pressure, and pulse ox.

## 2014-05-20 NOTE — ED Notes (Signed)
Pt continues to be monitored by 5 lead, blood pressure, and pulse ox.  

## 2014-05-20 NOTE — Discharge Instructions (Signed)
Your chest pain is likely from acid reflux. Please take nexium daily to help reduced your acid in the stomach.   Follow up with your primary care doctor.    Gastroesophageal Reflux Disease, Adult Gastroesophageal reflux disease (GERD) happens when acid from your stomach flows up into the esophagus. When acid comes in contact with the esophagus, the acid causes soreness (inflammation) in the esophagus. Over time, GERD may create small holes (ulcers) in the lining of the esophagus. CAUSES   Increased body weight. This puts pressure on the stomach, making acid rise from the stomach into the esophagus.  Smoking. This increases acid production in the stomach.  Drinking alcohol. This causes decreased pressure in the lower esophageal sphincter (valve or ring of muscle between the esophagus and stomach), allowing acid from the stomach into the esophagus.  Late evening meals and a full stomach. This increases pressure and acid production in the stomach.  A malformed lower esophageal sphincter. Sometimes, no cause is found. SYMPTOMS   Burning pain in the lower part of the mid-chest behind the breastbone and in the mid-stomach area. This may occur twice a week or more often.  Trouble swallowing.  Sore throat.  Dry cough.  Asthma-like symptoms including chest tightness, shortness of breath, or wheezing. DIAGNOSIS  Your caregiver may be able to diagnose GERD based on your symptoms. In some cases, X-rays and other tests may be done to check for complications or to check the condition of your stomach and esophagus. TREATMENT  Your caregiver may recommend over-the-counter or prescription medicines to help decrease acid production. Ask your caregiver before starting or adding any new medicines.  HOME CARE INSTRUCTIONS   Change the factors that you can control. Ask your caregiver for guidance concerning weight loss, quitting smoking, and alcohol consumption.  Avoid foods and drinks that make your  symptoms worse, such as:  Caffeine or alcoholic drinks.  Chocolate.  Peppermint or mint flavorings.  Garlic and onions.  Spicy foods.  Citrus fruits, such as oranges, lemons, or limes.  Tomato-based foods such as sauce, chili, salsa, and pizza.  Fried and fatty foods.  Avoid lying down for the 3 hours prior to your bedtime or prior to taking a nap.  Eat small, frequent meals instead of large meals.  Wear loose-fitting clothing. Do not wear anything tight around your waist that causes pressure on your stomach.  Raise the head of your bed 6 to 8 inches with wood blocks to help you sleep. Extra pillows will not help.  Only take over-the-counter or prescription medicines for pain, discomfort, or fever as directed by your caregiver.  Do not take aspirin, ibuprofen, or other nonsteroidal anti-inflammatory drugs (NSAIDs). SEEK IMMEDIATE MEDICAL CARE IF:   You have pain in your arms, neck, jaw, teeth, or back.  Your pain increases or changes in intensity or duration.  You develop nausea, vomiting, or sweating (diaphoresis).  You develop shortness of breath, or you faint.  Your vomit is green, yellow, black, or looks like coffee grounds or blood.  Your stool is red, bloody, or black. These symptoms could be signs of other problems, such as heart disease, gastric bleeding, or esophageal bleeding. MAKE SURE YOU:   Understand these instructions.  Will watch your condition.  Will get help right away if you are not doing well or get worse. Document Released: 07/27/2005 Document Revised: 01/09/2012 Document Reviewed: 05/06/2011 [facility] Patient Information 2015 [place], [place]. This information is not intended to replace advice given to you by your  health care provider. Make sure you discuss any questions you have with your health care provider. ° °

## 2014-05-20 NOTE — ED Notes (Signed)
Pt. seen for followup, being discharged@ this time, ambulating in no distress from E42 with RN, family member @ side.

## 2014-05-20 NOTE — ED Notes (Signed)
Pt. arrived via [facility] on 2 lpm n/c with 4.0 uncuffed Trach in place, (unknown brand, has plug attachment in place on Trach-unplugged upon arrival) had since "1987", 96% on room air, no Oxygen used @ home only humidity, placed on 28% fi02@5  lpm/humidified ATC, b/l b.s. Clear= t/o, c/c-chest pain-RN aware, h/a, rr-18, v/s WNL @ this time RT to monitor.

## 2014-05-20 NOTE — ED Notes (Signed)
Welts noted at iv site. MD informed. Benadryl given.

## 2014-05-20 NOTE — ED Notes (Signed)
First contact with patient. Pt requesting pain medication. Md aware.

## 2014-05-20 NOTE — ED Provider Notes (Signed)
CSN: [id number]     Arrival date & time 05/20/14  1407 History   First MD Initiated Contact with Patient 05/20/14 1417     Chief Complaint  Patient presents with  . Chest Pain  . Back Pain     (Consider location/radiation/quality/duration/timing/severity/associated sxs/prior Treatment) HPI  Patient is 53 yo female with hx of recurrent PNA, on trach, anxiety, and arthritis here with left sided chest pain, back pain, neck pain for 2 days. Her pain is burning in quality. Chest pain worsens with deep breathing. It radiates to her arm and back. Most pain is felt in the thoraic spine area. Has cough with yellow sputum. Denies any hemoptysis. Has chills but no fever. Has some blurry vision. Denies numbness/tingling/weakness.  Has taken percocet for the pain but didn't help. Pain does not change with movement. Also has dizziness upon standing. Denies n/v/diarrhea/constipation/hematuria/dysuria.    Past Medical History  Diagnosis Date  . Pneumonia   . Chronic pain   . Arthritis   . Difficult intubation   . Shortness of breath   . Sleep apnea   . Cancer of cervix   . Recurrent upper respiratory infection (URI)   . Headache(784.0)   . Anxiety   . Neuromuscular disorder   . Fibromyalgia   . Depression    Past Surgical History  Procedure Laterality Date  . Neck surgery    . Laryngoscopy N/A 01/24/2014    Procedure: MICRO LARYNGOSCOPY;  Surgeon: Melida Quitter, MD;  Location: [place];  Service: ENT;  Laterality: N/A;  MICRO DIRECT LARYNGOSCOPY   No family history on file. History  Substance Use Topics  . Smoking status: Never Smoker   . Smokeless tobacco: Not on file  . Alcohol Use: Yes   OB History   Grav Para Term Preterm Abortions TAB SAB Ect Mult Living                 Review of Systems  Constitutional: Positive for chills. Negative for fever, diaphoresis and fatigue.  HENT: Positive for congestion. Negative for ear pain, hearing loss, nosebleeds, rhinorrhea, sneezing, sore  throat and voice change.   Eyes: Positive for visual disturbance. Negative for photophobia and pain.  Respiratory: Positive for cough and chest tightness. Negative for shortness of breath and wheezing.   Cardiovascular: Positive for chest pain. Negative for palpitations and leg swelling.  Gastrointestinal: Negative for nausea, vomiting, abdominal pain, diarrhea, constipation, blood in stool and abdominal distention.  Endocrine: Negative.   Genitourinary: Negative.   Musculoskeletal: Positive for back pain, neck pain and neck stiffness. Negative for arthralgias, gait problem, joint swelling and myalgias.  Skin: Negative.   Allergic/Immunologic: Negative.   Neurological: Positive for dizziness, light-headedness and headaches. Negative for tremors, seizures, syncope, speech difficulty, weakness and numbness.  Hematological: Negative.       Allergies  Codeine; Pentazocine lactate; Ranitidine hcl; and Tobramycin  Home Medications   Prior to Admission medications   Medication Sig Start Date End Date Taking? Authorizing Provider  diazepam (VALIUM) 5 MG tablet Take 5 mg by mouth every 8 (eight) hours as needed. anxiety   Yes Historical Provider, MD  FLUoxetine (PROZAC) 10 MG capsule Take 10 mg by mouth daily.  02/17/14  Yes Historical Provider, MD  LORazepam (ATIVAN) 1 MG tablet Take 1 mg by mouth every 4 (four) hours as needed for anxiety.  02/17/14  Yes Historical Provider, MD  oxyCODONE-acetaminophen (PERCOCET) 10-325 MG per tablet Take 1 tablet by mouth every 4 (four) hours as needed  for pain.   Yes Historical Provider, MD  oxymorphone (OPANA) 10 MG tablet Take 10 mg by mouth every 4 (four) hours as needed for pain.   Yes Historical Provider, MD  pregabalin (LYRICA) 150 MG capsule Take 150 mg by mouth 2 (two) times daily.     Yes Historical Provider, MD  roflumilast (DALIRESP) 500 MCG TABS tablet Take 500 mcg by mouth daily.  02/17/14  Yes Historical Provider, MD  TRAVATAN Z 0.004 % SOLN  ophthalmic solution Place 1 drop into both eyes 2 (two) times daily.  01/07/14  Yes Historical Provider, MD  traZODone (DESYREL) 50 MG tablet Take 50 mg by mouth at bedtime.    Yes Historical Provider, MD  esomeprazole (NEXIUM) 40 MG capsule Take 1 capsule (40 mg total) by mouth daily at 12 noon. 05/20/14   Dellia Nims, MD  HYDROcodone-acetaminophen (NORCO) 5-325 MG per tablet Take 1 tablet by mouth every 6 (six) hours as needed for moderate pain. 05/20/14   Lux Skilton, MD   BP 145/100  Pulse 75  Temp(Src) 98.2 F (36.8 C) (Oral)  Resp 14  Ht 5\' 2"  (1.575 m)  Wt 120 lb (54.432 kg)  BMI 21.94 kg/m2  SpO2 98% Physical Exam  Constitutional: She is oriented to person, place, and time. She appears well-developed. She is active. No distress.  HENT:  Head: Normocephalic and atraumatic.  Right Ear: External ear normal.  Left Ear: External ear normal.  Nose: Nose normal.  Mouth/Throat: Oropharynx is clear and moist.  Eyes: Conjunctivae and EOM are normal. Pupils are equal, round, and reactive to light.  Neck: Normal range of motion and full passive range of motion without pain. Neck supple. No JVD present. Spinous process tenderness and muscular tenderness present. No tracheal tenderness present. No rigidity. No tracheal deviation, no edema, no erythema and normal range of motion present. No Brudzinski's sign and no Kernig's sign noted.  Tracheostomy tube present.  No meningismus present however patient notes tenderness during neck and spine palpation.   Cardiovascular: Normal rate, regular rhythm and normal heart sounds.  Exam reveals no gallop and no friction rub.   No murmur heard. Pulmonary/Chest: Effort normal and breath sounds normal. No respiratory distress. She has no wheezes. She has no rales. She exhibits tenderness.  Abdominal: Soft. Bowel sounds are normal. She exhibits no distension and no mass. There is no tenderness. There is no rebound, no guarding and no CVA tenderness.   Musculoskeletal: Normal range of motion. She exhibits no edema.       Cervical back: She exhibits tenderness and pain. She exhibits no edema.       Thoracic back: She exhibits tenderness and pain. She exhibits no swelling and no edema.       Lumbar back: She exhibits tenderness and pain. She exhibits no swelling and no edema.  Has tenderness to palpation on her spinous processes, mostly on the thoraic area. Also has muscular tenderness on the back.   Neurological: She is alert and oriented to person, place, and time. She has normal strength. No cranial nerve deficit or sensory deficit.  Skin: Skin is warm. No rash noted.    ED Course  Procedures (including critical care time) Labs Review Labs Reviewed  BASIC METABOLIC PANEL - Abnormal; Notable for the following:    Potassium 3.4 (*)    GFR calc non Af Amer 81 (*)    Anion gap 17 (*)    All other components within normal limits  CBC  D-DIMER,  QUANTITATIVE  Randolm Idol, ED    Imaging Review Dg Chest Port 1 View  05/20/2014   CLINICAL DATA:  Chest pain.  Back pain.  LEFT arm pain.  EXAM: PORTABLE CHEST - 1 VIEW  COMPARISON:  01/27/2014.  FINDINGS: Cardiopericardial silhouette appears within normal limits. Patient rotated to the RIGHT. Monitoring leads project over the chest. No airspace disease. Pulmonary parenchymal scarring is present over the RIGHT hemidiaphragm. No pleural effusion. Mediastinal contours appear similar to prior exam. Compared to the prior study, the LEFT IJ central line has been removed.  IMPRESSION: No acute cardiopulmonary disease.  Chronic RIGHT basilar scarring.   Electronically Signed   By: Dereck Ligas M.D.   On: 05/20/2014 14:58     EKG Interpretation   Date/Time:  Tuesday May 20 2014 14:02:40 EDT Ventricular Rate:  76 PR Interval:  124 QRS Duration: 91 QT Interval:  403 QTC Calculation: 453 R Axis:   90 Text Interpretation:  Sinus rhythm Borderline right axis deviation  Confirmed by HORTON   MD, COURTNEY ([id number]) on 05/20/2014 4:29:54 PM     CXR shows no acute changes, has chronic right basilar scarring.  Troponing negative. On further questioning by attending Dr. Dina Rich, patient said her chest pain is worse after eating and it does not change with deep breathing.  Gave her GI cocktail, protonix, and morphine.   D dimer was negative.  MDM   Final diagnoses:  Burning chest pain   Patient's history is inconsistent. Has lower likelyhood of any cardiac cause. Since pain is worse after eating, it is likely of GI etiology. Will send home with PPI and instruction to follow up with PCP.     Dellia Nims, MD 05/20/14 [id number]

## 2014-05-20 NOTE — ED Notes (Signed)
Pt. seen for evaluation, no c/o @ this time, RT to monitor.
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
## 2014-05-20 NOTE — ED Notes (Signed)
Pt unhappy with care related to pain. States original complaint improved. No decrease in chronic pain

## 2014-05-22 NOTE — ED Provider Notes (Signed)
I saw and evaluated the patient, reviewed the resident's note and I agree with the findings and plan.   EKG Interpretation   Date/Time:  Tuesday May 20 2014 14:02:40 EDT Ventricular Rate:  76 PR Interval:  124 QRS Duration: 91 QT Interval:  403 QTC Calculation: 453 R Axis:   90 Text Interpretation:  Sinus rhythm Borderline right axis deviation  Confirmed by HORTON  MD, COURTNEY ([id number]) on 05/20/2014 4:29:54 PM      Patient presents with chest pain and back pain. Patient states onset of symptoms 2 days ago. She reports that the pain is burning. No noted association with food. It is also worsened with deep breathing. Currently her pain is 10 out of 10. Nothing makes it better or worse. She does report cough productive of yellow sputum. Noted fevers at home. No history of blood clots. Patient does have a trach and has a history of respiratory failure.  Patient denies chest pain. On exam she is nontoxic. Breath sounds are clear without wheezing. Patient has tenderness palpation of the anterior chest.  Workup is negative including EKG, troponin, and d-dimer. Chest x-ray is clear.  Chest pain has some features of GERD. Patient was given GI cocktail as well as pain medication.  She does endorse a cough but has no wheezing on exam. Discussed results with patient. We'll have her followup with PCP. We'll empirically start on a PPI.  After history, exam, and medical workup I feel the patient has been appropriately medically screened and is safe for discharge home. Pertinent diagnoses were discussed with the patient. Patient was given return precautions.   Results for orders placed during the hospital encounter of 05/20/14  CBC      Result Value Ref Range   WBC 6.6  4.0 - 10.5 K/uL   RBC 4.30  3.87 - 5.11 MIL/uL   Hemoglobin 13.2  12.0 - 15.0 g/dL   HCT 38.8  36.0 - 46.0 %   MCV 90.2  78.0 - 100.0 fL   MCH 30.7  26.0 - 34.0 pg   MCHC 34.0  30.0 - 36.0 g/dL   RDW 14.0  11.5 - 15.5 %   Platelets  397  150 - 400 K/uL  BASIC METABOLIC PANEL      Result Value Ref Range   Sodium 143  137 - 147 mEq/L   Potassium 3.4 (*) 3.7 - 5.3 mEq/L   Chloride 102  96 - 112 mEq/L   CO2 24  19 - 32 mEq/L   Glucose, Bld 87  70 - 99 mg/dL   BUN 14  6 - 23 mg/dL   Creatinine, Ser 0.82  0.50 - 1.10 mg/dL   Calcium 9.1  8.4 - 10.5 mg/dL   GFR calc non Af Amer 81 (*) >90 mL/min   GFR calc Af Amer >90  >90 mL/min   Anion gap 17 (*) 5 - 15  D-DIMER, QUANTITATIVE      Result Value Ref Range   D-Dimer, Quant <0.27  0.00 - 0.48 ug/mL-FEU  I-STAT TROPOININ, ED      Result Value Ref Range   Troponin i, poc 0.00  0.00 - 0.08 ng/mL   Comment 3            Dg Chest Port 1 View  05/20/2014   CLINICAL DATA:  Chest pain.  Back pain.  LEFT arm pain.  EXAM: PORTABLE CHEST - 1 VIEW  COMPARISON:  01/27/2014.  FINDINGS: Cardiopericardial silhouette appears within normal limits. Patient  rotated to the RIGHT. Monitoring leads project over the chest. No airspace disease. Pulmonary parenchymal scarring is present over the RIGHT hemidiaphragm. No pleural effusion. Mediastinal contours appear similar to prior exam. Compared to the prior study, the LEFT IJ central line has been removed.  IMPRESSION: No acute cardiopulmonary disease.  Chronic RIGHT basilar scarring.   Electronically Signed   By: Dereck Ligas M.D.   On: 05/20/2014 14:58       Merryl Hacker, MD 05/22/14 [phone]

## 2015-01-27 ENCOUNTER — Inpatient Hospital Stay (HOSPITAL_COMMUNITY): Payer: Medicare Other

## 2015-01-27 ENCOUNTER — Inpatient Hospital Stay (HOSPITAL_COMMUNITY)
Admission: EM | Admit: 2015-01-27 | Discharge: 2015-01-29 | DRG: 189 | Disposition: A | Discharge status: Home / Self Care | Payer: Medicare Other | Attending: Internal Medicine | Admitting: Internal Medicine

## 2015-01-27 ENCOUNTER — Emergency Department (HOSPITAL_COMMUNITY): Payer: Medicare Other

## 2015-01-27 ENCOUNTER — Encounter (HOSPITAL_COMMUNITY): Payer: Self-pay | Admitting: Emergency Medicine

## 2015-01-27 DIAGNOSIS — F329 Major depressive disorder, single episode, unspecified: Secondary | ICD-10-CM | POA: Diagnosis present

## 2015-01-27 DIAGNOSIS — R400 Somnolence: Secondary | ICD-10-CM | POA: Diagnosis present

## 2015-01-27 DIAGNOSIS — Z888 Allergy status to other drugs, medicaments and biological substances status: Secondary | ICD-10-CM

## 2015-01-27 DIAGNOSIS — F419 Anxiety disorder, unspecified: Secondary | ICD-10-CM | POA: Diagnosis present

## 2015-01-27 DIAGNOSIS — I959 Hypotension, unspecified: Secondary | ICD-10-CM | POA: Diagnosis present

## 2015-01-27 DIAGNOSIS — G709 Myoneural disorder, unspecified: Secondary | ICD-10-CM | POA: Diagnosis present

## 2015-01-27 DIAGNOSIS — R0902 Hypoxemia: Secondary | ICD-10-CM | POA: Diagnosis present

## 2015-01-27 DIAGNOSIS — G8929 Other chronic pain: Secondary | ICD-10-CM | POA: Diagnosis present

## 2015-01-27 DIAGNOSIS — R4182 Altered mental status, unspecified: Secondary | ICD-10-CM

## 2015-01-27 DIAGNOSIS — Z881 Allergy status to other antibiotic agents status: Secondary | ICD-10-CM

## 2015-01-27 DIAGNOSIS — J9621 Acute and chronic respiratory failure with hypoxia: Secondary | ICD-10-CM | POA: Insufficient documentation

## 2015-01-27 DIAGNOSIS — G4733 Obstructive sleep apnea (adult) (pediatric): Secondary | ICD-10-CM | POA: Diagnosis present

## 2015-01-27 DIAGNOSIS — M797 Fibromyalgia: Secondary | ICD-10-CM | POA: Diagnosis present

## 2015-01-27 DIAGNOSIS — Z930 Tracheostomy status: Secondary | ICD-10-CM | POA: Diagnosis not present

## 2015-01-27 DIAGNOSIS — J9601 Acute respiratory failure with hypoxia: Secondary | ICD-10-CM | POA: Diagnosis present

## 2015-01-27 DIAGNOSIS — Z8541 Personal history of malignant neoplasm of cervix uteri: Secondary | ICD-10-CM

## 2015-01-27 DIAGNOSIS — E162 Hypoglycemia, unspecified: Secondary | ICD-10-CM | POA: Diagnosis not present

## 2015-01-27 DIAGNOSIS — R40242 Glasgow coma scale score 9-12, unspecified time: Secondary | ICD-10-CM

## 2015-01-27 DIAGNOSIS — J9811 Atelectasis: Secondary | ICD-10-CM | POA: Diagnosis present

## 2015-01-27 DIAGNOSIS — Z22322 Carrier or suspected carrier of Methicillin resistant Staphylococcus aureus: Secondary | ICD-10-CM | POA: Diagnosis not present

## 2015-01-27 DIAGNOSIS — M1990 Unspecified osteoarthritis, unspecified site: Secondary | ICD-10-CM | POA: Diagnosis present

## 2015-01-27 DIAGNOSIS — Z885 Allergy status to narcotic agent status: Secondary | ICD-10-CM | POA: Diagnosis not present

## 2015-01-27 DIAGNOSIS — N179 Acute kidney failure, unspecified: Secondary | ICD-10-CM | POA: Diagnosis present

## 2015-01-27 DIAGNOSIS — E872 Acidosis: Secondary | ICD-10-CM | POA: Diagnosis present

## 2015-01-27 DIAGNOSIS — E8729 Other acidosis: Secondary | ICD-10-CM | POA: Diagnosis present

## 2015-01-27 DIAGNOSIS — J9691 Respiratory failure, unspecified with hypoxia: Secondary | ICD-10-CM | POA: Diagnosis present

## 2015-01-27 HISTORY — DX: Acute kidney failure, unspecified: N17.9

## 2015-01-27 LAB — BLOOD GAS, ARTERIAL
Acid-base deficit: 2.9 mmol/L — ABNORMAL HIGH (ref 0.0–2.0)
Acid-base deficit: 0.5 mmol/L (ref 0.0–2.0)
BICARBONATE: 23.1 meq/L (ref 20.0–24.0)
Bicarbonate: 24.4 mEq/L — ABNORMAL HIGH (ref 20.0–24.0)
Drawn by: 40415
Drawn by: 362771
FIO2: 0.4 %
FIO2: 0.28 %
O2 SAT: 98.2 %
O2 Saturation: 96.4 %
PCO2 ART: 52.8 mmHg — AB (ref 35.0–45.0)
Patient temperature: 98.6
Patient temperature: 98.6
TCO2: 24.7 mmol/L (ref 0–100)
TCO2: 25.9 mmol/L (ref 0–100)
pCO2 arterial: 46.3 mmHg — ABNORMAL HIGH (ref 35.0–45.0)
pH, Arterial: 7.264 — ABNORMAL LOW (ref 7.350–7.450)
pH, Arterial: 7.342 — ABNORMAL LOW (ref 7.350–7.450)
pO2, Arterial: 125 mmHg — ABNORMAL HIGH (ref 80.0–100.0)
pO2, Arterial: 83.4 mmHg (ref 80.0–100.0)

## 2015-01-27 LAB — HEPATIC FUNCTION PANEL
ALT: 13 U/L (ref 0–35)
AST: 15 U/L (ref 0–37)
Albumin: 4 g/dL (ref 3.5–5.2)
Alkaline Phosphatase: 108 U/L (ref 39–117)
Bilirubin, Direct: <0.1 mg/dL (ref 0.0–0.5)
TOTAL PROTEIN: 8.1 g/dL (ref 6.0–8.3)
Total Bilirubin: 0.3 mg/dL (ref 0.3–1.2)

## 2015-01-27 LAB — COMPREHENSIVE METABOLIC PANEL
ALBUMIN: 3.2 g/dL — AB (ref 3.5–5.2)
ALT: 10 U/L (ref 0–35)
AST: 16 U/L (ref 0–37)
Alkaline Phosphatase: 91 U/L (ref 39–117)
Anion gap: 3 — ABNORMAL LOW (ref 5–15)
BUN: 13 mg/dL (ref 6–23)
CALCIUM: 8.6 mg/dL (ref 8.4–10.5)
CO2: 25 mmol/L (ref 19–32)
CREATININE: 1.13 mg/dL — AB (ref 0.50–1.10)
Chloride: 110 mmol/L (ref 96–112)
GFR calc Af Amer: 63 mL/min — ABNORMAL LOW (ref >90)
GFR calc non Af Amer: 54 mL/min — ABNORMAL LOW (ref >90)
GLUCOSE: 82 mg/dL (ref 70–99)
Potassium: 4.5 mmol/L (ref 3.5–5.1)
Sodium: 138 mmol/L (ref 135–145)
Total Bilirubin: 0.3 mg/dL (ref 0.3–1.2)
Total Protein: 6.9 g/dL (ref 6.0–8.3)

## 2015-01-27 LAB — CBC
HCT: 42.4 % (ref 36.0–46.0)
HEMATOCRIT: 40.4 % (ref 36.0–46.0)
HEMOGLOBIN: 14 g/dL (ref 12.0–15.0)
Hemoglobin: 13.3 g/dL (ref 12.0–15.0)
MCH: 31.4 pg (ref 26.0–34.0)
MCH: 31.8 pg (ref 26.0–34.0)
MCHC: 32.9 g/dL (ref 30.0–36.0)
MCHC: 33 g/dL (ref 30.0–36.0)
MCV: 95.1 fL (ref 78.0–100.0)
MCV: 96.7 fL (ref 78.0–100.0)
PLATELETS: 264 10*3/uL (ref 150–400)
Platelets: 364 10*3/uL (ref 150–400)
RBC: 4.18 MIL/uL (ref 3.87–5.11)
RBC: 4.46 MIL/uL (ref 3.87–5.11)
RDW: 13.2 % (ref 11.5–15.5)
RDW: 13.2 % (ref 11.5–15.5)
WBC: 8.7 10*3/uL (ref 4.0–10.5)
WBC: 5.5 10*3/uL (ref 4.0–10.5)

## 2015-01-27 LAB — BASIC METABOLIC PANEL
ANION GAP: 8 (ref 5–15)
BUN: 21 mg/dL (ref 6–23)
CO2: 26 mmol/L (ref 19–32)
CREATININE: 1.5 mg/dL — AB (ref 0.50–1.10)
Calcium: 9.4 mg/dL (ref 8.4–10.5)
Chloride: 103 mmol/L (ref 96–112)
GFR, EST AFRICAN AMERICAN: 45 mL/min — AB (ref >90)
GFR, EST NON AFRICAN AMERICAN: 39 mL/min — AB (ref >90)
Glucose, Bld: 69 mg/dL — ABNORMAL LOW (ref 70–99)
Potassium: 4.3 mmol/L (ref 3.5–5.1)
Sodium: 137 mmol/L (ref 135–145)

## 2015-01-27 LAB — EXPECTORATED SPUTUM ASSESSMENT W GRAM STAIN, RFLX TO RESP C

## 2015-01-27 LAB — GLUCOSE, CAPILLARY
GLUCOSE-CAPILLARY: 229 mg/dL — AB (ref 70–99)
Glucose-Capillary: 129 mg/dL — ABNORMAL HIGH (ref 70–99)

## 2015-01-27 LAB — BRAIN NATRIURETIC PEPTIDE: B NATRIURETIC PEPTIDE 5: 16.2 pg/mL (ref 0.0–100.0)

## 2015-01-27 LAB — EXPECTORATED SPUTUM ASSESSMENT W REFEX TO RESP CULTURE

## 2015-01-27 LAB — I-STAT TROPONIN, ED: Troponin i, poc: 0 ng/mL (ref 0.00–0.08)

## 2015-01-27 LAB — I-STAT CG4 LACTIC ACID, ED: LACTIC ACID, VENOUS: 1.04 mmol/L (ref 0.5–2.0)

## 2015-01-27 LAB — INFLUENZA PANEL BY PCR (TYPE A & B)
H1N1FLUPCR: NOT DETECTED
INFLBPCR: NEGATIVE
Influenza A By PCR: NEGATIVE

## 2015-01-27 LAB — D-DIMER, QUANTITATIVE (NOT AT ARMC): D-Dimer, Quant: 0.42 ug/mL-FEU (ref 0.00–0.48)

## 2015-01-27 LAB — MRSA PCR SCREENING: MRSA BY PCR: POSITIVE — AB

## 2015-01-27 MED ORDER — LEVOFLOXACIN IN D5W 250 MG/50ML IV SOLN
250.0000 mg | INTRAVENOUS | Status: DC
  Filled 2015-01-27: qty 50

## 2015-01-27 MED ORDER — CEFTRIAXONE SODIUM 1 G IJ SOLR: 1.0000 g | INTRAMUSCULAR | Status: DC

## 2015-01-27 MED ORDER — DM-GUAIFENESIN ER 30-600 MG PO TB12
1.0000 | ORAL_TABLET | Freq: Two times a day (BID) | ORAL | Status: DC
Start: 2015-01-27 — End: 2015-01-29
  Administered 2015-01-27 – 2015-01-29 (×4): 1 via ORAL
  Filled 2015-01-27 (×5): qty 1

## 2015-01-27 MED ORDER — IPRATROPIUM-ALBUTEROL 0.5-2.5 (3) MG/3ML IN SOLN
3.0000 mL | Freq: Two times a day (BID) | RESPIRATORY_TRACT | Status: DC
  Administered 2015-01-28 – 2015-01-29 (×3): 3 mL via RESPIRATORY_TRACT
  Filled 2015-01-27 (×3): qty 3

## 2015-01-27 MED ORDER — TRAZODONE HCL 50 MG PO TABS
50.0000 mg | ORAL_TABLET | Freq: Every day | ORAL | Status: DC
  Administered 2015-01-27 – 2015-01-28 (×2): 50 mg via ORAL
  Filled 2015-01-27 (×3): qty 1

## 2015-01-27 MED ORDER — OXYMORPHONE HCL 10 MG PO TABS: 10.0000 mg | ORAL_TABLET | Freq: Two times a day (BID) | ORAL | Status: DC

## 2015-01-27 MED ORDER — IPRATROPIUM-ALBUTEROL 0.5-2.5 (3) MG/3ML IN SOLN
3.0000 mL | Freq: Four times a day (QID) | RESPIRATORY_TRACT | Status: DC
  Administered 2015-01-27: 3 mL via RESPIRATORY_TRACT
  Filled 2015-01-27: qty 3

## 2015-01-27 MED ORDER — DIAZEPAM 5 MG PO TABS
5.0000 mg | ORAL_TABLET | Freq: Three times a day (TID) | ORAL | Status: DC | PRN
Start: 2015-01-27 — End: 2015-01-29

## 2015-01-27 MED ORDER — LATANOPROST 0.005 % OP SOLN
1.0000 [drp] | Freq: Every day | OPHTHALMIC | Status: DC
  Administered 2015-01-27 – 2015-01-28 (×2): 1 [drp] via OPHTHALMIC
  Filled 2015-01-27: qty 2.5

## 2015-01-27 MED ORDER — SODIUM CHLORIDE 0.9 % IV SOLN
INTRAVENOUS | Status: DC
  Administered 2015-01-27 – 2015-01-28 (×2): via INTRAVENOUS
  Administered 2015-01-29 (×2): 125 mL/h via INTRAVENOUS

## 2015-01-27 MED ORDER — SODIUM CHLORIDE 0.9 % IV BOLUS (SEPSIS)
1000.0000 mL | Freq: Once | INTRAVENOUS | Status: AC
  Administered 2015-01-27: 1000 mL via INTRAVENOUS

## 2015-01-27 MED ORDER — DIAZEPAM 5 MG PO TABS
10.0000 mg | ORAL_TABLET | Freq: Three times a day (TID) | ORAL | Status: DC | PRN
  Administered 2015-01-27: 10 mg via ORAL
  Filled 2015-01-27: qty 2

## 2015-01-27 MED ORDER — DEXTROSE-NACL 5-0.45 % IV SOLN
INTRAVENOUS | Status: AC
  Administered 2015-01-27: 06:00:00 via INTRAVENOUS

## 2015-01-27 MED ORDER — OXYCODONE HCL 5 MG PO TABS: 5.0000 mg | ORAL_TABLET | ORAL | Status: DC | PRN

## 2015-01-27 MED ORDER — DEXTROSE 5 % IV SOLN: 500.0000 mg | Freq: Once | INTRAVENOUS | Status: DC

## 2015-01-27 MED ORDER — ALBUTEROL SULFATE (2.5 MG/3ML) 0.083% IN NEBU: 2.5000 mg | INHALATION_SOLUTION | Freq: Four times a day (QID) | RESPIRATORY_TRACT | Status: DC | PRN

## 2015-01-27 MED ORDER — SODIUM CHLORIDE 0.9 % IJ SOLN
3.0000 mL | Freq: Two times a day (BID) | INTRAMUSCULAR | Status: DC
  Administered 2015-01-28 – 2015-01-29 (×2): 3 mL via INTRAVENOUS

## 2015-01-27 MED ORDER — LEVOFLOXACIN IN D5W 500 MG/100ML IV SOLN
500.0000 mg | INTRAVENOUS | Status: DC
  Administered 2015-01-27: 500 mg via INTRAVENOUS
  Filled 2015-01-27: qty 100

## 2015-01-27 MED ORDER — OXYCODONE-ACETAMINOPHEN 5-325 MG PO TABS
1.0000 | ORAL_TABLET | ORAL | Status: DC | PRN
  Administered 2015-01-27: 1 via ORAL
  Filled 2015-01-27: qty 1

## 2015-01-27 MED ORDER — MORPHINE SULFATE ER 15 MG PO TBCR
15.0000 mg | EXTENDED_RELEASE_TABLET | Freq: Two times a day (BID) | ORAL | Status: DC
  Administered 2015-01-27 – 2015-01-29 (×4): 15 mg via ORAL
  Filled 2015-01-27 (×4): qty 1

## 2015-01-27 MED ORDER — INFLUENZA VAC SPLIT QUAD 0.5 ML IM SUSY
0.5000 mL | PREFILLED_SYRINGE | INTRAMUSCULAR | Status: DC
  Filled 2015-01-27: qty 0.5

## 2015-01-27 MED ORDER — FAMOTIDINE 20 MG PO TABS
20.0000 mg | ORAL_TABLET | Freq: Every day | ORAL | Status: DC
Start: 2015-01-27 — End: 2015-01-29
  Administered 2015-01-27 – 2015-01-28 (×2): 20 mg via ORAL
  Filled 2015-01-27 (×3): qty 1

## 2015-01-27 MED ORDER — FLUOXETINE HCL 10 MG PO CAPS
10.0000 mg | ORAL_CAPSULE | Freq: Every day | ORAL | Status: DC
  Administered 2015-01-27 – 2015-01-29 (×3): 10 mg via ORAL
  Filled 2015-01-27 (×3): qty 1

## 2015-01-27 MED ORDER — ALBUTEROL SULFATE (2.5 MG/3ML) 0.083% IN NEBU: 2.5000 mg | INHALATION_SOLUTION | RESPIRATORY_TRACT | Status: DC | PRN

## 2015-01-27 MED ORDER — OXYCODONE-ACETAMINOPHEN 5-325 MG PO TABS
1.0000 | ORAL_TABLET | Freq: Three times a day (TID) | ORAL | Status: DC | PRN
  Administered 2015-01-28: 1 via ORAL
  Filled 2015-01-27: qty 1

## 2015-01-27 MED ORDER — OXYCODONE-ACETAMINOPHEN 10-325 MG PO TABS: 1.0000 | ORAL_TABLET | ORAL | Status: DC | PRN

## 2015-01-27 MED ORDER — PREGABALIN 75 MG PO CAPS
150.0000 mg | ORAL_CAPSULE | Freq: Two times a day (BID) | ORAL | Status: DC
  Administered 2015-01-27 – 2015-01-29 (×5): 150 mg via ORAL
  Filled 2015-01-27 (×2): qty 2
  Filled 2015-01-27: qty 6
  Filled 2015-01-27: qty 2
  Filled 2015-01-27: qty 6

## 2015-01-27 MED ORDER — ACETAMINOPHEN 650 MG RE SUPP: 650.0000 mg | Freq: Four times a day (QID) | RECTAL | Status: DC | PRN

## 2015-01-27 MED ORDER — INSULIN ASPART 100 UNIT/ML ~~LOC~~ SOLN
0.0000 [IU] | SUBCUTANEOUS | Status: DC
  Administered 2015-01-27: 1 [IU] via SUBCUTANEOUS

## 2015-01-27 MED ORDER — ENOXAPARIN SODIUM 30 MG/0.3ML ~~LOC~~ SOLN
30.0000 mg | SUBCUTANEOUS | Status: DC
  Administered 2015-01-27 – 2015-01-28 (×2): 30 mg via SUBCUTANEOUS
  Filled 2015-01-27 (×2): qty 0.3

## 2015-01-27 MED ORDER — TRAZODONE HCL 100 MG PO TABS
100.0000 mg | ORAL_TABLET | Freq: Every day | ORAL | Status: DC
Start: 2015-01-27 — End: 2015-01-27
  Filled 2015-01-27: qty 1

## 2015-01-27 MED ORDER — MORPHINE SULFATE 15 MG PO TABS: 30.0000 mg | ORAL_TABLET | Freq: Two times a day (BID) | ORAL | Status: DC

## 2015-01-27 MED ORDER — ACETAMINOPHEN 325 MG PO TABS: 650.0000 mg | ORAL_TABLET | Freq: Four times a day (QID) | ORAL | Status: DC | PRN

## 2015-01-27 NOTE — ED Provider Notes (Signed)
CSN: [id number]     Arrival date & time 01/27/15  0007 History   First MD Initiated Contact with Patient 01/27/15 0014     This chart was scribed for Allie Bossier, MD by Forrestine Him, ED Scribe. This patient was seen in room 2C06C/2C06C-01 and the patient's care was started 12:17 AM.   Chief Complaint  Patient presents with  . Respiratory Distress  . Altered Mental Status  . Shortness of Breath   The history is provided by the EMS personnel and a relative. No language interpreter was used.    LEVEL 5 CAVEAT DUE TO ALTERED MENTAL STATUS  HPI Comments: Whitney Marks brought in by EMS is a 54 y.o. female with a PMHx of trach placement since 1987 who presents to the Emergency Department here for respiratory distress this evening. Last known normal 2 days ago. Husband states pt has been having recent ongoing productive congestion along with fever for 1 week, but started declining with her mentation 3 days ago. She was at her worst today when he got back from home, and EMS was called.  EMS states pt has been in and out of consciousness upon arrival to home. In addition, failure to respond to questions was reported per EMS. En route to ED, BP 120/70 with pulse in high 80's. Pt had O2 sats in the 80s, and they applied NRB to the trach - and the O2 sats improved. Pt recently had a new trach placed 6 months ago. No sick contacts. No hx of PE, DVT - and pt is active when feeling well. She has several pain meds, but husband denies any overdose in the past. She is followed by [facility]  Past Medical History  Diagnosis Date  . Pneumonia   . Chronic pain   . Arthritis   . Difficult intubation   . Shortness of breath   . Sleep apnea   . Cancer of cervix   . Recurrent upper respiratory infection (URI)   . Headache(784.0)   . Anxiety   . Neuromuscular disorder   . Fibromyalgia   . Depression    Past Surgical History  Procedure Laterality Date  . Neck surgery    .  Laryngoscopy N/A 01/24/2014    Procedure: MICRO LARYNGOSCOPY;  Surgeon: Melida Quitter, MD;  Location: [place];  Service: ENT;  Laterality: N/A;  MICRO DIRECT LARYNGOSCOPY   History reviewed. No pertinent family history. History  Substance Use Topics  . Smoking status: Never Smoker   . Smokeless tobacco: Not on file  . Alcohol Use: Yes   OB History    No data available     Review of Systems  Unable to perform ROS: Mental status change      Allergies  Codeine; Pentazocine lactate; Ranitidine hcl; and Tobramycin  Home Medications   Prior to Admission medications   Medication Sig Start Date End Date Taking? Authorizing Provider  diazepam (VALIUM) 10 MG tablet Take 10 mg by mouth every 8 (eight) hours as needed for anxiety.   Yes Historical Provider, MD  FLUoxetine (PROZAC) 10 MG capsule Take 10 mg by mouth daily.  02/17/14  Yes Historical Provider, MD  oxyCODONE-acetaminophen (PERCOCET) 10-325 MG per tablet Take 1 tablet by mouth every 4 (four) hours as needed for pain.   Yes Historical Provider, MD  oxymorphone (OPANA) 10 MG tablet Take 10 mg by mouth 2 (two) times daily.    Yes Historical Provider, MD  pregabalin (LYRICA) 150 MG capsule Take  150 mg by mouth 2 (two) times daily.     Yes Historical Provider, MD  ranitidine (ZANTAC) 150 MG tablet Take 150 mg by mouth at bedtime.   Yes Historical Provider, MD  TRAVATAN Z 0.004 % SOLN ophthalmic solution Place 1 drop into both eyes 2 (two) times daily.  01/07/14  Yes Historical Provider, MD  traZODone (DESYREL) 100 MG tablet Take 100 mg by mouth at bedtime.   Yes Historical Provider, MD  esomeprazole (NEXIUM) 40 MG capsule Take 1 capsule (40 mg total) by mouth daily at 12 noon. Patient not taking: Reported on 01/27/2015 05/20/14   Dellia Nims, MD  HYDROcodone-acetaminophen (NORCO) 5-325 MG per tablet Take 1 tablet by mouth every 6 (six) hours as needed for moderate pain. Patient not taking: Reported on 01/27/2015 05/20/14   Dellia Nims, MD    Triage Vitals: BP 123/105 mmHg  Pulse 78  Resp 25  SpO2 95%   Physical Exam  Constitutional: She appears well-developed and well-nourished. No distress.  HENT:  Head: Normocephalic and atraumatic.  Eyes: EOM are normal.  Pupils 3 millimeters and equal No signs of trauma to head  Neck: Normal range of motion. No JVD present.  No crepitus on neck exam Pt has catheter in her neck No nuchal rigidity on passive ROM of the neck  Cardiovascular: Normal rate, regular rhythm and normal heart sounds.   Pulses:      Radial pulses are 2+ on the right side, and 2+ on the left side.  Pulmonary/Chest: Effort normal and breath sounds normal. No respiratory distress. She has no wheezes.  Lungs clear  Abdominal: Soft. She exhibits no distension. There is no tenderness.  Musculoskeletal: Normal range of motion. She exhibits no edema or tenderness.  Neurological:  Somnolent/lethargic GCS eyes 4 verbal 1 motor 6-11 Follows simple motor commands Has not communicated verbally yet  Skin: Skin is warm and dry.  No rash noted  Psychiatric: She has a normal mood and affect. Judgment normal.  Nursing note and vitals reviewed.   ED Course  Procedures (including critical care time)  DIAGNOSTIC STUDIES: Oxygen Saturation is 95% on RA, adequate by my interpretation.    COORDINATION OF CARE: 12:15 AM-Discussed treatment plan with pt at bedside and pt agreed to plan.     Labs Review Labs Reviewed  BASIC METABOLIC PANEL - Abnormal; Notable for the following:    Glucose, Bld 69 (*)    Creatinine, Ser 1.50 (*)    GFR calc non Af Amer 39 (*)    GFR calc Af Amer 45 (*)    All other components within normal limits  BLOOD GAS, ARTERIAL - Abnormal; Notable for the following:    pH, Arterial 7.264 (*)    pCO2 arterial 52.8 (*)    pO2, Arterial 125.0 (*)    Acid-base deficit 2.9 (*)    All other components within normal limits  MRSA PCR SCREENING  CBC  BRAIN NATRIURETIC PEPTIDE  HEPATIC FUNCTION  PANEL  URINE RAPID DRUG SCREEN (HOSP PERFORMED)  D-DIMER, QUANTITATIVE  CBC  COMPREHENSIVE METABOLIC PANEL  INFLUENZA PANEL BY PCR (TYPE A & B, H1N1)  I-STAT TROPOININ, ED  I-STAT CG4 LACTIC ACID, ED  CBG MONITORING, ED    Imaging Review Ct Head Wo Contrast  01/27/2015   CLINICAL DATA:  Increasing lethargy and shortness of breath for 1 day, hypoxia. Unresponsive.  EXAM: CT HEAD WITHOUT CONTRAST  TECHNIQUE: Contiguous axial images were obtained from the base of the skull through the vertex  without intravenous contrast.  COMPARISON:  CT of the head October 30, 2011  FINDINGS: The ventricles and sulci are normal. No intraparenchymal hemorrhage, mass effect nor midline shift. No acute large vascular territory infarcts. 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 Small area of LEFT parietal encephalomalacia, stable.  No abnormal extra-axial fluid collections. Basal cisterns are patent.  No skull fracture. The included ocular globes and orbital contents are non-suspicious. The mastoid aircells and included paranasal sinuses are well-aerated.  IMPRESSION: No acute intracranial process.  Remote posterior circulation infarcts.   Electronically Signed   By: Elon Alas   On: 01/27/2015 03:32   Dg Chest Port 1 View  01/27/2015   CLINICAL DATA:  Respiratory distress. Tracheostomy, altered mental status, shortness of breath.  EXAM: PORTABLE CHEST - 1 VIEW  COMPARISON:  05/20/2014  FINDINGS: Tracheostomy tube at the thoracic inlet. Low lung volumes. There is bibasilar atelectasis superimposed on right basilar scarring. Cardiomediastinal contours are unchanged allowing for differences in technique. Mild prominence of central pulmonary vasculature without overt edema. No large pleural effusion, no pneumothorax.  IMPRESSION: Hypoventilatory chest with bibasilar atelectasis.   Electronically Signed   By: Jeb Levering M.D.   On: 01/27/2015 02:11     EKG Interpretation   Date/Time:   Tuesday January 27 2015 00:34:34 EDT Ventricular Rate:  69 PR Interval:  137 QRS Duration: 88 QT Interval:  397 QTC Calculation: 425 R Axis:   33 Text Interpretation:  Sinus rhythm Normal intervals ST and T wave is  normal No significant change since last tracing Confirmed by Janice Seales, MD,  Thelma Comp [phone]) on 01/27/2015 12:40:16 AM      MDM   Final diagnoses:  AKI (acute kidney injury)  Acute on chronic respiratory failure with hypoxia  Glasgow coma scale total score 9-12    I personally performed the services described in this documentation, which was scribed in my presence. The recorded information has been reviewed and is accurate.  Pt comes in with cc of altered mental status.  DDx includes: ICH/Stroke Sepsis syndrome Meningitis/encephalitis Seizures Electrolyte abnormality Drug overdose Metabolic disorders including thyroid disorders, adrenal insufficiency Acute anemia Cancer of unknown origin Hypercapnia COPD Hypoxia  Pt comes in with cc of AMS. She has hx of chronic resp failure, and has a trach in place. Pt however is not vent dependent. She is not hypoxic with O2 per nasal canula. Her trach looks different than normal as well. No meningeal signs. - 0 SIRs on vital signs. Labs are normal and reassuring.  DDX: flu, ? CAP, medication/pain meds.  Really don't think this is meningitis right now. Will admit. Will cover for CAP. Stepdown admission.  CRITICAL CARE Performed by: Varney Biles   Total critical care time: 33 minutes - for acute on chronic respiratory failure  Critical care time was exclusive of separately billable procedures and treating other patients.  Critical care was necessary to treat or prevent imminent or life-threatening deterioration.  Critical care was time spent personally by me on the following activities: development of treatment plan with patient and/or surrogate as well as nursing, discussions with consultants, evaluation of  patient's response to treatment, examination of patient, obtaining history from patient or surrogate, ordering and performing treatments and interventions, ordering and review of laboratory studies, ordering and review of radiographic studies, pulse oximetry and re-evaluation of patient's condition.    Varney Biles, MD 01/27/15 [phone]

## 2015-01-27 NOTE — ED Notes (Addendum)
PER ems: ems called for increasing lethargy and shortness of breath over 1 day..   On arrival, spo2 was 87%, patient has trach placed already.  NRB placed on patient with ems and spo2 increased to 99%. On opana, percocet and muscle relaxer's at home. Patient not verbal, gcs 11.

## 2015-01-27 NOTE — Progress Notes (Signed)
10:15 3/29: RN attempted to call husband x 3 times to request he bring in Opana pain med (per pharmacy not hospital stock . Will attempt again at noon.

## 2015-01-27 NOTE — Clinical Documentation Improvement (Signed)
  Please clarify the follow results. Thank you.   Abnormal Lab and/or Testing Results: Component     Latest Ref Rng 01/27/2015          FIO2      0.40  Delivery systems      TRACH COLLAR/TRACH TUBE  pH, Arterial     7.350 - 7.450 7.264 (L)  pCO2 arterial     35.0 - 45.0 mmHg 52.8 (H)  pO2, Arterial     80.0 - 100.0 mmHg 125.0 (H)  Bicarbonate     20.0 - 24.0 mEq/L 23.1  TCO2     0 - 100 mmol/L 24.7  Acid-base deficit     0.0 - 2.0 mmol/L 2.9 (H)  O2 Saturation      98.2  Patient temperature      98.6  Collection site      LEFT RADIAL  Drawn by      [phone]  Sample type      ARTERIAL  Allens test (pass/fail)     PASS PASS   Treatment provided: Oxygen therapy continuous Pulse oximetry check with vital signs  Possible Clinical Conditions: Respiratory Acidosis Compensation for metabolic acidosis Other Cannot be determined  Thank you, Icyss Skog T. Pricilla Handler, MSN, MBA/MHA Clinical Documentation Specialist [email or web address] Office # [phone]

## 2015-01-27 NOTE — H&P (Addendum)
Whitney Marks is an 54 y.o. female.    Pcp:  ? Chief Complaint: hypoxic respiratory failure HPI: 54 yo female with hx of chronic tracheostomy, tracheal stenosis, OSA, apparently c/o sob. Pt is somnolent and unable to give clear history.  Pt had low pox in ED.  CXR showed bibasilar atelectasis.  Its unclear if pain medication is related to her low pox.  ABG pending.  Pt will be admitted for hypoxic respiratory failure.    Past Medical History  Diagnosis Date  . Pneumonia   . Chronic pain   . Arthritis   . Difficult intubation   . Shortness of breath   . Sleep apnea   . Cancer of cervix   . Recurrent upper respiratory infection (URI)   . Headache(784.0)   . Anxiety   . Neuromuscular disorder   . Fibromyalgia   . Depression     Past Surgical History  Procedure Laterality Date  . Neck surgery    . Laryngoscopy N/A 01/24/2014    Procedure: MICRO LARYNGOSCOPY;  Surgeon: Melida Quitter, MD;  Location: [place];  Service: ENT;  Laterality: N/A;  MICRO DIRECT LARYNGOSCOPY    History reviewed. No pertinent family history. Social History:  reports that she has never smoked. She does not have any smokeless tobacco history on file. She reports that she drinks alcohol. She reports that she does not use illicit drugs.  Allergies:  Allergies  Allergen Reactions  . Codeine Itching  . Pentazocine Lactate Other (See Comments)    hallucinates  . Ranitidine Hcl Other (See Comments)    blisters  . Tobramycin Hives  Medications reviewed   (Not in a hospital admission)  Results for orders placed or performed during the hospital encounter of 01/27/15 (from the past 48 hour(s))  CBC     Status: None   Collection Time: 01/27/15 12:20 AM  Result Value Ref Range   WBC 8.7 4.0 - 10.5 K/uL   RBC 4.46 3.87 - 5.11 MIL/uL   Hemoglobin 14.0 12.0 - 15.0 g/dL   HCT 42.4 36.0 - 46.0 %   MCV 95.1 78.0 - 100.0 fL   MCH 31.4 26.0 - 34.0 pg   MCHC 33.0 30.0 - 36.0 g/dL   RDW 13.2 11.5 - 15.5 %   Platelets 364 150 - 400 K/uL  Basic metabolic panel     Status: Abnormal   Collection Time: 01/27/15 12:20 AM  Result Value Ref Range   Sodium 137 135 - 145 mmol/L   Potassium 4.3 3.5 - 5.1 mmol/L   Chloride 103 96 - 112 mmol/L   CO2 26 19 - 32 mmol/L   Glucose, Bld 69 (L) 70 - 99 mg/dL   BUN 21 6 - 23 mg/dL   Creatinine, Ser 1.50 (H) 0.50 - 1.10 mg/dL   Calcium 9.4 8.4 - 10.5 mg/dL   GFR calc non Af Amer 39 (L) >90 mL/min   GFR calc Af Amer 45 (L) >90 mL/min    Comment: (NOTE) The eGFR has been calculated using the CKD EPI equation. This calculation has not been validated in all clinical situations. eGFR's persistently <90 mL/min signify possible Chronic Kidney Disease.    Anion gap 8 5 - 15  BNP (order ONLY if patient complains of dyspnea/SOB AND you have documented it for THIS visit)     Status: None   Collection Time: 01/27/15 12:20 AM  Result Value Ref Range   B Natriuretic Peptide 16.2 0.0 - 100.0 pg/mL  Hepatic function  panel     Status: None   Collection Time: 01/27/15 12:20 AM  Result Value Ref Range   Total Protein 8.1 6.0 - 8.3 g/dL   Albumin 4.0 3.5 - 5.2 g/dL   AST 15 0 - 37 U/L   ALT 13 0 - 35 U/L   Alkaline Phosphatase 108 39 - 117 U/L   Total Bilirubin 0.3 0.3 - 1.2 mg/dL   Bilirubin, Direct <0.1 0.0 - 0.5 mg/dL   Indirect Bilirubin NOT CALCULATED 0.3 - 0.9 mg/dL  I-stat troponin, ED (not at [facility])     Status: None   Collection Time: 01/27/15 12:28 AM  Result Value Ref Range   Troponin i, poc 0.00 0.00 - 0.08 ng/mL   Comment 3            Comment: Due to the release kinetics of cTnI, a negative result within the first hours of the onset of symptoms does not rule out myocardial infarction with certainty. If myocardial infarction is still suspected, repeat the test at appropriate intervals.   I-Stat CG4 Lactic Acid, ED     Status: None   Collection Time: 01/27/15 12:33 AM  Result Value Ref Range   Lactic Acid, Venous 1.04 0.5 - 2.0 mmol/L   Dg Chest  Port 1 View  01/27/2015   CLINICAL DATA:  Respiratory distress. Tracheostomy, altered mental status, shortness of breath.  EXAM: PORTABLE CHEST - 1 VIEW  COMPARISON:  05/20/2014  FINDINGS: Tracheostomy tube at the thoracic inlet. Low lung volumes. There is bibasilar atelectasis superimposed on right basilar scarring. Cardiomediastinal contours are unchanged allowing for differences in technique. Mild prominence of central pulmonary vasculature without overt edema. No large pleural effusion, no pneumothorax.  IMPRESSION: Hypoventilatory chest with bibasilar atelectasis.   Electronically Signed   By: Jeb Levering M.D.   On: 01/27/2015 02:11    Review of Systems  Unable to perform ROS: mental acuity    Blood pressure 95/63, pulse 58, temperature 97.6 F (36.4 C), temperature source Oral, resp. rate 15, SpO2 99 %. Physical Exam  Constitutional: She is oriented to person, place, and time. She appears well-developed and well-nourished.  HENT:  Head: Normocephalic and atraumatic.  Mouth/Throat: No oropharyngeal exudate.  Eyes: Conjunctivae and EOM are normal. Pupils are equal, round, and reactive to light. No scleral icterus.  Neck: Normal range of motion. Neck supple. No JVD present. No tracheal deviation present. No thyromegaly present.  Cardiovascular: Normal rate and regular rhythm.  Exam reveals no gallop and no friction rub.   No murmur heard. Respiratory: No stridor. She has wheezes. She has no rales. She exhibits no tenderness.  GI: Soft. Bowel sounds are normal. She exhibits no distension. There is no tenderness. There is no rebound and no guarding.  Musculoskeletal: Normal range of motion. She exhibits no edema or tenderness.  Lymphadenopathy:    She has no cervical adenopathy.  Neurological: She is alert and oriented to person, place, and time. She has normal reflexes. She displays normal reflexes. No cranial nerve deficit. She exhibits normal muscle tone. Coordination normal.  Skin:  Skin is warm and dry. No rash noted. No erythema. No pallor.  Psychiatric: She has a normal mood and affect. Her behavior is normal. Judgment and thought content normal.     Assessment/Plan Hypoxic respiratory failure Check ABG Check d dimer, if positive then check CTA chest o2  Start levaquin $RemoveBefore'500mg'[id number]$  iv qday for ? Occult pneumonia    Tracheostomy Consider ENT consult in am  AMS  Check CT brain  Hypoglycemia D51/2 ns iv  DVT prophylaxis: scd, lovenox  Jani Gravel 01/27/2015, 2:34 AM

## 2015-01-27 NOTE — Progress Notes (Signed)
[place] TEAM 1 - Stepdown/ICU TEAM Progress Note  Whitney Marks:[id number] DOB: February 02, 1961 DOA: 01/27/2015 PCP: PROVIDER NOT IN SYSTEM  Admit HPI / Brief Narrative: 54 yo WF PMHx anxiety, fibromyalgia, depression, neuromuscular disorder, Chronic tracheostomy, tracheal stenosis, OSA, apparently c/o sob.  Pt is somnolent and unable to give clear history. Pt had low pox in ED. CXR showed bibasilar atelectasis. Its unclear if pain medication is related to her low pox. ABG pending. Pt will be admitted for hypoxic respiratory failure.    HPI/Subjective: 3/29 extremely lethargic and would not wake up when stimulated.  Assessment/Plan: Acute on chronic Hypoxic respiratory failure -Patient without fever, leukocytosis, PCXR negative for infection will stop antibiotic. Most likely multifactorial, to include overmedication with benzodiazepine and narcotic -Obtain respiratory virus panel, sputum culture  -Recheck ABG -DuoNeb QID  -Out of bed to chair  Q shift -D-dimer; negative -Decrease 5 mg TID PRN -Decrease morphine SR to 15 mg BID  -Decrease oxycodone to TID -Decrease trazodone to 50 mg QHS  Non-anion gap Respiratory acidosis -Patient's lethargy, and respiratory acidosis most likely secondary to overmedication -See hypoxic respiratory failure  Tracheostomy Consider ENT consult in am  Hypotensive -Normal saline 124ml/min  Altered mental status -See hypoxic respiratory failure  Hypoglycemia -CBG q 4hr   Code Status: FULL Family Communication: no family present at time of exam Disposition Plan: Resolution of altered mental status    Consultants: NA  Procedure/Significant Events: 3/29 PCXR;Hypoventilatory chest with bibasilar atelectasis 3/29 CT head without contrast; negative for acute process-Remote bilateral cerebellar infarcts -Small area of LEFT parietal encephalomalacia, stable   Culture 3/29 influenza A/B/H1N1 negative 3/29 respiratory virus  panel pending 3/29 sputum pending 3/29 MRSA positive  Antibiotics: Levofloxacin 3/29>> stopped 3/29  DVT prophylaxis: Lovenox   Devices Tracheostomy   LINES / TUBES:      Continuous Infusions: . sodium chloride      Objective: VITAL SIGNS: Temp: 97.7 F (36.5 C) (03/29 1653) Temp Source: Oral (03/29 1653) BP: 89/53 mmHg (03/29 1713) Pulse Rate: 64 (03/29 1713) SPO2; FIO2:   Intake/Output Summary (Last 24 hours) at 01/27/15 1952 Last data filed at 01/27/15 1800  Gross per 24 hour  Intake 1166.25 ml  Output    600 ml  Net 566.25 ml     Exam: General: Lethargic, difficult to arouse No acute respiratory distress Lungs: Clear to auscultation bilaterally without wheezes or crackles Cardiovascular: Bradycardia regular rhythm without murmur gallop or rub normal S1 and S2 Abdomen: Nontender, nondistended, soft, bowel sounds positive, no rebound, no ascites, no appreciable mass Extremities: No significant cyanosis, clubbing, or edema bilateral lower extremities  Data Reviewed: Basic Metabolic Panel:  Recent Labs Lab 01/27/15 0020 01/27/15 1359  NA 137 138  K 4.3 4.5  CL 103 110  CO2 26 25  GLUCOSE 69* 82  BUN 21 13  CREATININE 1.50* 1.13*  CALCIUM 9.4 8.6   Liver Function Tests:  Recent Labs Lab 01/27/15 0020 01/27/15 1359  AST 15 16  ALT 13 10  ALKPHOS 108 91  BILITOT 0.3 0.3  PROT 8.1 6.9  ALBUMIN 4.0 3.2*   No results for input(s): LIPASE, AMYLASE in the last 168 hours. No results for input(s): AMMONIA in the last 168 hours. CBC:  Recent Labs Lab 01/27/15 0020 01/27/15 1359  WBC 8.7 5.5  HGB 14.0 13.3  HCT 42.4 40.4  MCV 95.1 96.7  PLT 364 264   Cardiac Enzymes: No results for input(s): CKTOTAL, CKMB, CKMBINDEX, TROPONINI in the last 168  hours. BNP (last 3 results)  Recent Labs  01/27/15 0020  BNP 16.2    ProBNP (last 3 results) No results for input(s): PROBNP in the last 8760 hours.  CBG:  Recent Labs Lab  01/27/15 0301  GLUCAP 229*    Recent Results (from the past 240 hour(s))  MRSA PCR Screening     Status: Abnormal   Collection Time: 01/27/15  5:26 AM  Result Value Ref Range Status   MRSA by PCR POSITIVE (A) NEGATIVE Final    Comment:        The GeneXpert MRSA Assay (FDA approved for NASAL specimens only), is one component of a comprehensive MRSA colonization surveillance program. It is not intended to diagnose MRSA infection nor to guide or monitor treatment for MRSA infections. RESULT CALLED TO, READ BACK BY AND VERIFIED WITH: B.RONCALLO,RN 01/27/15 0812 BY BSLADE      Studies:  Recent x-ray studies have been reviewed in detail by the Attending Physician  Scheduled Meds:  Scheduled Meds: . dextromethorphan-guaiFENesin  1 tablet Oral BID  . enoxaparin (LOVENOX) injection  30 mg Subcutaneous Q24H  . famotidine  20 mg Oral QHS  . FLUoxetine  10 mg Oral Daily  . [START ON 01/28/2015] Influenza vac split quadrivalent PF  0.5 mL Intramuscular Tomorrow-1000  . insulin aspart  0-9 Units Subcutaneous 6 times per day  . ipratropium-albuterol  3 mL Nebulization QID  . latanoprost  1 drop Both Eyes QHS  . morphine  15 mg Oral Q12H  . pregabalin  150 mg Oral BID  . sodium chloride  3 mL Intravenous Q12H  . traZODone  50 mg Oral QHS    Time spent on care of this patient: 40 mins   WOODS, Geraldo Docker , MD  [facility] Office  [phone] Pager - [phone]  On-Call/Text Page:      [email or web address]      password [email or web address]  If 7PM-7AM, please contact night-coverage [email or web address] Password [email or web address] 01/27/2015, 7:52 PM   LOS: 0 days   Care during the described time interval was provided by me .  I have reviewed this patient's available data, including medical history, events of note, physical examination, radiology studies and test results as part of my evaluation  Dia Crawford, MD [phone] Pager

## 2015-01-27 NOTE — Progress Notes (Signed)
Utilization Review Completed.  

## 2015-01-28 DIAGNOSIS — J9601 Acute respiratory failure with hypoxia: Secondary | ICD-10-CM

## 2015-01-28 DIAGNOSIS — I959 Hypotension, unspecified: Secondary | ICD-10-CM

## 2015-01-28 DIAGNOSIS — R0902 Hypoxemia: Secondary | ICD-10-CM

## 2015-01-28 LAB — COMPREHENSIVE METABOLIC PANEL
ALBUMIN: 2.9 g/dL — AB (ref 3.5–5.2)
ALT: 10 U/L (ref 0–35)
AST: 14 U/L (ref 0–37)
Alkaline Phosphatase: 83 U/L (ref 39–117)
Anion gap: 4 — ABNORMAL LOW (ref 5–15)
BUN: 9 mg/dL (ref 6–23)
CALCIUM: 8.6 mg/dL (ref 8.4–10.5)
CHLORIDE: 112 mmol/L (ref 96–112)
CO2: 25 mmol/L (ref 19–32)
Creatinine, Ser: 1.11 mg/dL — ABNORMAL HIGH (ref 0.50–1.10)
GFR calc non Af Amer: 56 mL/min — ABNORMAL LOW (ref >90)
GFR, EST AFRICAN AMERICAN: 65 mL/min — AB (ref >90)
GLUCOSE: 84 mg/dL (ref 70–99)
POTASSIUM: 4.3 mmol/L (ref 3.5–5.1)
SODIUM: 141 mmol/L (ref 135–145)
TOTAL PROTEIN: 6.1 g/dL (ref 6.0–8.3)
Total Bilirubin: 0.2 mg/dL — ABNORMAL LOW (ref 0.3–1.2)

## 2015-01-28 LAB — CBC WITH DIFFERENTIAL/PLATELET
BASOS ABS: 0 10*3/uL (ref 0.0–0.1)
BASOS PCT: 1 % (ref 0–1)
EOS ABS: 0.3 10*3/uL (ref 0.0–0.7)
Eosinophils Relative: 4 % (ref 0–5)
HCT: 36.5 % (ref 36.0–46.0)
Hemoglobin: 11.8 g/dL — ABNORMAL LOW (ref 12.0–15.0)
Lymphocytes Relative: 39 % (ref 12–46)
Lymphs Abs: 2.2 10*3/uL (ref 0.7–4.0)
MCH: 30.9 pg (ref 26.0–34.0)
MCHC: 32.3 g/dL (ref 30.0–36.0)
MCV: 95.5 fL (ref 78.0–100.0)
MONO ABS: 0.4 10*3/uL (ref 0.1–1.0)
MONOS PCT: 7 % (ref 3–12)
NEUTROS ABS: 2.8 10*3/uL (ref 1.7–7.7)
NEUTROS PCT: 49 % (ref 43–77)
Platelets: 260 10*3/uL (ref 150–400)
RBC: 3.82 MIL/uL — AB (ref 3.87–5.11)
RDW: 13.4 % (ref 11.5–15.5)
WBC: 5.7 10*3/uL (ref 4.0–10.5)

## 2015-01-28 LAB — GLUCOSE, CAPILLARY
GLUCOSE-CAPILLARY: 122 mg/dL — AB (ref 70–99)
GLUCOSE-CAPILLARY: 79 mg/dL (ref 70–99)
GLUCOSE-CAPILLARY: 82 mg/dL (ref 70–99)
GLUCOSE-CAPILLARY: 84 mg/dL (ref 70–99)
Glucose-Capillary: 103 mg/dL — ABNORMAL HIGH (ref 70–99)
Glucose-Capillary: 139 mg/dL — ABNORMAL HIGH (ref 70–99)

## 2015-01-28 LAB — RAPID URINE DRUG SCREEN, HOSP PERFORMED
AMPHETAMINES: NOT DETECTED
BENZODIAZEPINES: POSITIVE — AB
Barbiturates: NOT DETECTED
COCAINE: NOT DETECTED
OPIATES: POSITIVE — AB
TETRAHYDROCANNABINOL: NOT DETECTED

## 2015-01-28 LAB — MAGNESIUM: MAGNESIUM: 1.8 mg/dL (ref 1.5–2.5)

## 2015-01-28 MED ORDER — INSULIN ASPART 100 UNIT/ML ~~LOC~~ SOLN
0.0000 [IU] | Freq: Three times a day (TID) | SUBCUTANEOUS | Status: DC
  Administered 2015-01-28: 1 [IU] via SUBCUTANEOUS

## 2015-01-28 MED ORDER — DOCUSATE SODIUM 100 MG PO CAPS
200.0000 mg | ORAL_CAPSULE | Freq: Two times a day (BID) | ORAL | Status: DC
  Administered 2015-01-28 – 2015-01-29 (×2): 200 mg via ORAL
  Filled 2015-01-28 (×4): qty 2

## 2015-01-28 MED ORDER — STARCH (THICKENING) PO POWD
ORAL | Status: DC | PRN
Start: 2015-01-28 — End: 2015-01-29
  Filled 2015-01-28: qty 227

## 2015-01-28 MED ORDER — ENOXAPARIN SODIUM 40 MG/0.4ML ~~LOC~~ SOLN
40.0000 mg | SUBCUTANEOUS | Status: DC
  Administered 2015-01-29: 40 mg via SUBCUTANEOUS
  Filled 2015-01-28: qty 0.4

## 2015-01-28 NOTE — Progress Notes (Signed)
Whitney TEAM 1 - Stepdown/ICU TEAM Progress Note  TRINITI GRUETZMACHER MEQ:[id number] DOB: [birth year] DOA: 01/27/2015 PCP: PROVIDER NOT IN SYSTEM  Admit HPI / Brief Narrative: 54 yo F Hx Marks, Whitney Marks, Whitney Marks, Whitney Marks, Whitney Marks, Whitney Marks, Whitney Marks with c/o sob.  Pt had low pulse ox in ED. CXR showed bibasilar atelectasis. She was too somnolent to provide a hx at the time of her presentation.    HPI/Subjective:   Assessment/Plan:  Acute on Whitney Hypoxic respiratory failure -Patient without fever, leukocytosis, PCXR negative for infection therefore stopped antibiotic.  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 Whitney followed by ENT at [facility]  Hypotensive -likely due to combination of simple DH as well as narcotic Whitney benzo excess - improving w/ volume expansion - continue another 24hrs   Altered mental status See hypoxic respiratory failure - improving   Hypoglycemia Resolved   MRSA Screen +  Code Status: FULL Family Communication: no family present at time of exam Disposition Plan: Resolution of altered mental status - possible d/c home in AM   Consultants: NA  Procedures: 3/29 PCXR Hypoventilatory chest with bibasilar atelectasis 3/29 CT head without contrast negative for acute process-Remote bilateral cerebellar infarcts -Small area of LEFT parietal encephalomalacia, stable  Antibiotics: Levofloxacin 3/28  DVT prophylaxis: Lovenox  Objective: Blood pressure 99/67, pulse 67, temperature 98.3 F (36.8 C), temperature source Oral, resp. rate 14, height 5\' 6"  (1.676 m), weight 72.077 kg (158 lb 14.4 oz), SpO2 28 %.  Intake/Output Summary (Last 24  hours) at 01/28/15 1437 Last data filed at 01/28/15 1200  Gross per 24 hour  Intake   2140 ml  Output    200 ml  Net   1940 ml    Exam: General: somnolent but does awaken to voice - no acute respiratory distress HEENT:  Trach w/o evidence of dysfxn or surrounding erythema or d/c  Lungs: Clear to auscultation bilaterally without wheezes or crackles Cardiovascular: RRR without murmur gallop or rub normal S1 Whitney S2 Abdomen: Nontender, nondistended, soft, bowel sounds positive, no rebound, no ascites, no appreciable mass Extremities: No significant cyanosis, clubbing, or edema bilateral lower extremities  Data Reviewed: Basic Metabolic Panel:  Recent Labs Lab 01/27/15 0020 01/27/15 1359 01/28/15 0345  NA 137 138 141  K 4.3 4.5 4.3  CL 103 110 112  CO2 26 25 25   GLUCOSE 69* 82 84  BUN 21 13 9   CREATININE 1.50* 1.13* 1.11*  CALCIUM 9.4 8.6 8.6  MG  --   --  1.8   Liver Function Tests:  Recent Labs Lab 01/27/15 0020 01/27/15 1359 01/28/15 0345  AST 15 16 14   ALT 13 10 10   ALKPHOS 108 91 83  BILITOT 0.3 0.3 0.2*  PROT 8.1 6.9 6.1  ALBUMIN 4.0 3.2* 2.9*   CBC:  Recent Labs Lab 01/27/15 0020 01/27/15 1359 01/28/15 0345  WBC 8.7 5.5 5.7  NEUTROABS  --   --  2.8  HGB 14.0 13.3 11.8*  HCT 42.4 40.4 36.5  MCV 95.1 96.7 95.5  PLT 364 264 260   CBG:  Recent Labs Lab 01/27/15 0301 01/27/15 2122 01/28/15 0046 01/28/15 0420 01/28/15 0858  GLUCAP 229* 129* 82 79 84    Recent Results (from the past 240 hour(s))  MRSA PCR Screening     Status: Abnormal   Collection Time: 01/27/15  5:26 AM  Result Value Ref Range Status   MRSA by PCR POSITIVE (A) NEGATIVE Final    Comment:        The GeneXpert MRSA Assay (FDA approved for NASAL specimens only), is one component of a comprehensive MRSA colonization surveillance program. It is not intended to diagnose MRSA infection nor to guide or monitor treatment for MRSA infections. RESULT CALLED TO, READ BACK BY Whitney  VERIFIED WITH: B.RONCALLO,RN 01/27/15 0812 BY BSLADE   Culture, expectorated sputum-assessment     Status: None   Collection Time: 01/27/15  9:53 PM  Result Value Ref Range Status   Specimen Description SPUTUM  Final   Special Requests NONE  Final   Sputum evaluation   Final    MICROSCOPIC FINDINGS SUGGEST THAT THIS SPECIMEN IS NOT REPRESENTATIVE OF LOWER RESPIRATORY SECRETIONS. PLEASE RECOLLECT. Results Called to: Nicholes Calamity [id number]    Report Status 01/27/2015 FINAL  Final     Studies:  Recent x-ray studies have been reviewed in detail by the Attending Physician  Scheduled Meds:  Scheduled Meds: . dextromethorphan-guaiFENesin  1 tablet Oral BID  . [START ON 01/29/2015] enoxaparin (LOVENOX) injection  40 mg Subcutaneous Q24H  . famotidine  20 mg Oral QHS  . FLUoxetine  10 mg Oral Daily  . Influenza vac split quadrivalent PF  0.5 mL Intramuscular Tomorrow-1000  . insulin aspart  0-9 Units Subcutaneous 6 times per day  . ipratropium-albuterol  3 mL Nebulization BID  . latanoprost  1 drop Both Eyes QHS  . morphine  15 mg Oral Q12H  . pregabalin  150 mg Oral BID  . sodium chloride  3 mL Intravenous Q12H  . traZODone  50 mg Oral QHS    Time spent on care of this patient: 35 mins  Cherene Altes, MD [facility] For Consults/Admissions - Flow Manager - [phone] Office  [phone]  Contact MD directly via text page:      [email or web address]      password [facility]  01/28/2015, 2:37 PM   LOS: 1 day

## 2015-01-29 DIAGNOSIS — Z930 Tracheostomy status: Secondary | ICD-10-CM | POA: Diagnosis present

## 2015-01-29 DIAGNOSIS — R400 Somnolence: Secondary | ICD-10-CM | POA: Diagnosis present

## 2015-01-29 LAB — COMPREHENSIVE METABOLIC PANEL
ALK PHOS: 83 U/L (ref 39–117)
ALT: 10 U/L (ref 0–35)
AST: 13 U/L (ref 0–37)
Albumin: 2.8 g/dL — ABNORMAL LOW (ref 3.5–5.2)
Anion gap: 6 (ref 5–15)
BILIRUBIN TOTAL: 0.4 mg/dL (ref 0.3–1.2)
BUN: 12 mg/dL (ref 6–23)
CO2: 23 mmol/L (ref 19–32)
Calcium: 8.5 mg/dL (ref 8.4–10.5)
Chloride: 112 mmol/L (ref 96–112)
Creatinine, Ser: 1.07 mg/dL (ref 0.50–1.10)
GFR, EST AFRICAN AMERICAN: 67 mL/min — AB (ref >90)
GFR, EST NON AFRICAN AMERICAN: 58 mL/min — AB (ref >90)
GLUCOSE: 91 mg/dL (ref 70–99)
Potassium: 4.3 mmol/L (ref 3.5–5.1)
Sodium: 141 mmol/L (ref 135–145)
Total Protein: 6.1 g/dL (ref 6.0–8.3)

## 2015-01-29 LAB — RESPIRATORY VIRUS PANEL
Adenovirus: NEGATIVE
INFLUENZA B 1: NEGATIVE
Influenza A: NEGATIVE
Metapneumovirus: NEGATIVE
PARAINFLUENZA 2 A: NEGATIVE
PARAINFLUENZA 3 A: NEGATIVE
Parainfluenza 1: NEGATIVE
RESPIRATORY SYNCYTIAL VIRUS B: NEGATIVE
Respiratory Syncytial Virus A: NEGATIVE
Rhinovirus: NEGATIVE

## 2015-01-29 LAB — GLUCOSE, CAPILLARY
GLUCOSE-CAPILLARY: 132 mg/dL — AB (ref 70–99)
GLUCOSE-CAPILLARY: 98 mg/dL (ref 70–99)
Glucose-Capillary: 88 mg/dL (ref 70–99)
Glucose-Capillary: 122 mg/dL — ABNORMAL HIGH (ref 70–99)

## 2015-01-29 MED ORDER — DIAZEPAM 5 MG PO TABS: 5.0000 mg | ORAL_TABLET | Freq: Three times a day (TID) | ORAL | Status: DC | PRN

## 2015-01-29 MED ORDER — MUPIROCIN 2 % EX OINT
1.0000 "application " | TOPICAL_OINTMENT | Freq: Two times a day (BID) | CUTANEOUS | Status: DC
  Filled 2015-01-29: qty 22

## 2015-01-29 MED ORDER — SODIUM POLYSTYRENE SULFONATE 15 GM/60ML PO SUSP: 45.0000 g | Freq: Once | ORAL | Status: DC

## 2015-01-29 MED ORDER — HYDROCODONE-ACETAMINOPHEN 5-325 MG PO TABS: 1.0000 | ORAL_TABLET | Freq: Three times a day (TID) | ORAL | Status: DC | PRN

## 2015-01-29 MED ORDER — TRAZODONE HCL 50 MG PO TABS: 50.0000 mg | ORAL_TABLET | Freq: Every day | ORAL | Status: DC

## 2015-01-29 MED ORDER — FLUOXETINE HCL 10 MG PO CAPS: 20.0000 mg | ORAL_CAPSULE | Freq: Every day | ORAL | Status: DC

## 2015-01-29 MED ORDER — MORPHINE SULFATE ER 15 MG PO TBCR: 15.0000 mg | EXTENDED_RELEASE_TABLET | Freq: Two times a day (BID) | ORAL | Status: DC

## 2015-01-29 NOTE — Evaluation (Signed)
Occupational Therapy Evaluation Patient Details Name: Whitney Marks MRN: [id number] DOB: 1961/09/25 Today's Date: 01/29/2015    History of Present Illness Pt admitted with acute on chronic respiratory failure, likely due to overmedication. PMH: anxiety, depression, fibromyalgia, neuromuscular disorder, trach, OSA.   Clinical Impression   Pt was assisted for LB bathing and dressing prior to admission.  She presents with mild unsteady gait, requiring min guard assist, but able to ambulate without a device to the bathroom, perform pericare, self feed and groom with set up.  Pt likely to return to baseline with resolve of medical issues.  Pt's HR remained in the 60s and 02 sats in upper 90s to 100 on RA with activity.  No further OT needs.    Follow Up Recommendations  No OT follow up    Equipment Recommendations  None recommended by OT    Recommendations for Other Services       Precautions / Restrictions Precautions Precautions: Fall Restrictions Weight Bearing Restrictions: No      Mobility Bed Mobility Overal bed mobility: Modified Independent             General bed mobility comments: increased time to perform  Transfers Overall transfer level: Needs assistance Equipment used: None Transfers: Sit to/from Stand Sit to Stand: Min guard         General transfer comment: min guard for initial stability in elevation to standing    Balance Overall balance assessment: No apparent balance deficits (not formally assessed)                                          ADL Overall ADL's : Needs assistance/impaired Eating/Feeding: Independent;Sitting   Grooming: Wash/dry face;Set up;Sitting   Upper Body Bathing: Set up;Sitting   Lower Body Bathing: Minimal assistance;Sit to/from stand   Upper Body Dressing : Set up;Sitting   Lower Body Dressing: Minimal assistance;Sit to/from stand   Toilet Transfer: Min guard;Ambulation;Regular Producer, television/film/video and Hygiene: Supervision/safety;Sitting/lateral lean               Vision     Perception     Praxis      Pertinent Vitals/Pain Pain Assessment: 0-10 Pain Score: 10-Worst pain ever Pain Location: LEs, back Pain Descriptors / Indicators: Discomfort Pain Intervention(s): Patient requesting pain meds-RN notified;Repositioned     Hand Dominance Right   Extremity/Trunk Assessment Upper Extremity Assessment Upper Extremity Assessment: Overall WFL for tasks assessed   Lower Extremity Assessment Lower Extremity Assessment: Overall WFL for tasks assessed       Communication Communication Communication: Expressive difficulties;Tracheostomy   Cognition Arousal/Alertness: Awake/alert Behavior During Therapy: Flat affect Overall Cognitive Status: Within Functional Limits for tasks assessed                     General Comments       Exercises       Shoulder Instructions      Home Living Family/patient expects to be discharged to:: Private residence Living Arrangements: Spouse/significant other Available Help at Discharge: Personal care attendant;Family;Available PRN/intermittently Type of Home: House Home Access: Stairs to enter [employer] of Steps: 1 Entrance Stairs-Rails: None Home Layout: One level     Bathroom Shower/Tub: Teacher, early years/pre: Standard     Home Equipment: Environmental consultant - 2 wheels;Cane - single point;Bedside commode;Shower seat - built in;Grab bars -  tub/shower   Additional Comments: walk in shower, std toilet with riser      Prior Functioning/Environment Level of Independence: Needs assistance  Gait / Transfers Assistance Needed: amblated without device ADL's / Homemaking Assistance Needed: assisted for LB bathing and dressing and meal prep by personal care attendant 1 1/2 hours per day.   Comments: has aide 1 hr care attendent    OT Diagnosis:     OT Problem List:      OT Treatment/Interventions:      OT Goals(Current goals can be found in the care plan section) Acute Rehab OT Goals Patient Stated Goal: to go home  OT Frequency:     Barriers to D/C:            Co-evaluation              End of Session Nurse Communication: Patient requests pain meds  Activity Tolerance: Patient tolerated treatment well Patient left: in chair;with call bell/phone within reach   Time: 0837-0904 OT Time Calculation (min): 27 min Charges:  OT General Charges $OT Visit: 1 Procedure OT Evaluation $Initial OT Evaluation Tier I: 1 Procedure G-Codes:    Malka So 01/29/2015, 9:12 AM  [phone]

## 2015-01-29 NOTE — Progress Notes (Signed)
PT received discharge summary and education. Verbalized understanding. Vitals stable. Pt's daughter here to pick her up. Pt taken to car in wheelchair escorted by NT. Dorena Cookey, RN

## 2015-01-29 NOTE — Discharge Summary (Signed)
Physician Discharge Summary  Whitney Marks:[id number] DOB: November 10, 1960 DOA: 01/27/2015  PCP: PROVIDER NOT IN SYSTEM  Admit date: 01/27/2015 Discharge date: 01/29/2015  Time spent: 40 minutes  Recommendations for Outpatient Follow-up:  Acute on chronic Hypoxic respiratory failure -Patient without fever, leukocytosis, PCXR negative for infection will stop antibiotic. Most likely multifactorial, to include overmedication with benzodiazepine and narcotic -Obtain respiratory virus panel, sputum culture  -Influenza A/B/H1 N1 negative -D-dimer; negative -Discharge on Diazepam 5 mg TID PRN -Discharge on morphine SR to 15 mg BID  -Discharge on Norco 5-3 25 mg TID PRN breakthrough pain -Discharge on 50 mg QHS -Follow-up with PCP  Non-anion gap Respiratory acidosis -Patient's lethargy, and respiratory acidosis most likely secondary to overmedication -See hypoxic respiratory failure  Tracheostomy -Stable  -pt has a Montgomery canula placed 04/01/2014 and followed by ENT at [facility]  Hypotensive -Normal saline 18ml/min  Altered mental status -See hypoxic respiratory failure  Hypoglycemia -Resolved   Discharge Diagnoses:  Active Problems:   Acute respiratory failure with hypoxia   Hypoglycemia   Hypoxic   AKI (acute kidney injury)   Respiratory acidosis   Tracheostomy dependence   Hypotensive episode   Tracheostomy present   Somnolence   Discharge Condition: Stable  Diet recommendation: Regular/nectar thick fluids   Filed Weights   01/27/15 0350 01/28/15 0500 01/29/15 0500  Weight: 69.174 kg (152 lb 8 oz) 72.077 kg (158 lb 14.4 oz) 73.982 kg (163 lb 1.6 oz)    History of present illness:  54 yo WF PMHx anxiety, fibromyalgia, depression, neuromuscular disorder, Chronic tracheostomy, tracheal stenosis, OSA, apparently c/o sob.  Pt is somnolent and unable to give clear history. Pt had low pox in ED. CXR showed bibasilar atelectasis. Its unclear if pain medication is  related to her low pox. ABG pending. Pt will be admitted for hypoxic respiratory failure Patient's mental status change most likely secondary to overmedication with narcotics and benzodiazepines. After medications decreased patient's mental status improved significantly. Initial leukocytosis most likely reactionary secondary to patient's hypotension on admission. Patient does complain of increased neuropathic pain and bilateral lower extremities which is chronic, however states worse than previous. Patient was ruled out for PE. Stable for discharge  Procedure/Significant Events: 3/29 PCXR;Hypoventilatory chest with bibasilar atelectasis 3/29 CT head without contrast; negative for acute process-Remote bilateral cerebellar infarcts -Small area of LEFT parietal encephalomalacia, stable   Culture 3/29 influenza A/B/H1N1 negative 3/29 respiratory virus panel pending 3/29 sputum pending 3/29 MRSA positive  Antibiotics: Levofloxacin 3/29>> stopped 3/29   Discharge Exam: Filed Vitals:   01/29/15 0500 01/29/15 0727 01/29/15 0804 01/29/15 1139  BP:  120/63 131/69   Pulse:  58 62 74  Temp:   97.9 F (36.6 C)   TempSrc:   Oral   Resp:  19 16   Height:      Weight: 73.982 kg (163 lb 1.6 oz)     SpO2:  97% 95% 95%    General: A/O 4, NAD, No acute respiratory distress Lungs: Clear to auscultation bilaterally without wheezes or crackles Cardiovascular: Regular rhythm and rate without murmur gallop or rub normal S1 and S2 Abdomen: Nontender, nondistended, soft, bowel sounds positive, no rebound, no ascites, no appreciable mass Extremities: No significant cyanosis, clubbing, or edema bilateral lower extremities   Discharge Instructions     Medication List    STOP taking these medications        oxyCODONE-acetaminophen 10-325 MG per tablet  Commonly known as:  PERCOCET     oxymorphone  10 MG tablet  Commonly known as:  OPANA      TAKE these medications        diazepam 5 MG  tablet  Commonly known as:  VALIUM  Take 1 tablet (5 mg total) by mouth every 8 (eight) hours as needed for anxiety.     esomeprazole 40 MG capsule  Commonly known as:  NEXIUM  Take 1 capsule (40 mg total) by mouth daily at 12 noon.     FLUoxetine 10 MG capsule  Commonly known as:  PROZAC  Take 2 capsules (20 mg total) by mouth daily.     HYDROcodone-acetaminophen 5-325 MG per tablet  Commonly known as:  NORCO  Take 1 tablet by mouth every 8 (eight) hours as needed for moderate pain.     morphine 15 MG 12 hr tablet  Commonly known as:  MS CONTIN  Take 1 tablet (15 mg total) by mouth every 12 (twelve) hours.     pregabalin 150 MG capsule  Commonly known as:  LYRICA  Take 150 mg by mouth 2 (two) times daily.     ranitidine 150 MG tablet  Commonly known as:  ZANTAC  Take 150 mg by mouth at bedtime.     TRAVATAN Z 0.004 % Soln ophthalmic solution  Generic drug:  Travoprost (BAK Free)  Place 1 drop into both eyes 2 (two) times daily.     traZODone 50 MG tablet  Commonly known as:  DESYREL  Take 1 tablet (50 mg total) by mouth at bedtime.       Allergies  Allergen Reactions  . Codeine Itching  . Pentazocine Lactate Other (See Comments)    hallucinates  . Ranitidine Hcl Other (See Comments)    blisters  . Tobramycin Hives      The results of significant diagnostics from this hospitalization (including imaging, microbiology, ancillary and laboratory) are listed below for reference.    Significant Diagnostic Studies: Ct Head Wo Contrast  01/27/2015   CLINICAL DATA:  Increasing lethargy and shortness of breath for 1 day, hypoxia. Unresponsive.  EXAM: CT HEAD WITHOUT CONTRAST  TECHNIQUE: Contiguous axial images were obtained from the base of the skull through the vertex without intravenous contrast.  COMPARISON:  CT of the head October 30, 2011  FINDINGS: The ventricles and sulci are normal. No intraparenchymal hemorrhage, mass effect nor midline shift. No acute large  vascular territory infarcts. 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 Small area of LEFT parietal encephalomalacia, stable.  No abnormal extra-axial fluid collections. Basal cisterns are patent.  No skull fracture. The included ocular globes and orbital contents are non-suspicious. The mastoid aircells and included paranasal sinuses are well-aerated.  IMPRESSION: No acute intracranial process.  Remote posterior circulation infarcts.   Electronically Signed   By: Elon Alas   On: 01/27/2015 03:32   Dg Chest Port 1 View  01/27/2015   CLINICAL DATA:  Respiratory distress. Tracheostomy, altered mental status, shortness of breath.  EXAM: PORTABLE CHEST - 1 VIEW  COMPARISON:  05/20/2014  FINDINGS: Tracheostomy tube at the thoracic inlet. Low lung volumes. There is bibasilar atelectasis superimposed on right basilar scarring. Cardiomediastinal contours are unchanged allowing for differences in technique. Mild prominence of central pulmonary vasculature without overt edema. No large pleural effusion, no pneumothorax.  IMPRESSION: Hypoventilatory chest with bibasilar atelectasis.   Electronically Signed   By: Jeb Levering M.D.   On: 01/27/2015 02:11    Microbiology: Recent Results (from the past 240 hour(s))  MRSA PCR  Screening     Status: Abnormal   Collection Time: 01/27/15  5:26 AM  Result Value Ref Range Status   MRSA by PCR POSITIVE (A) NEGATIVE Final    Comment:        The GeneXpert MRSA Assay (FDA approved for NASAL specimens only), is one component of a comprehensive MRSA colonization surveillance program. It is not intended to diagnose MRSA infection nor to guide or monitor treatment for MRSA infections. RESULT CALLED TO, READ BACK BY AND VERIFIED WITH: B.RONCALLO,RN 01/27/15 0812 BY BSLADE   Culture, expectorated sputum-assessment     Status: None   Collection Time: 01/27/15  9:53 PM  Result Value Ref Range Status   Specimen Description SPUTUM  Final    Special Requests NONE  Final   Sputum evaluation   Final    MICROSCOPIC FINDINGS SUGGEST THAT THIS SPECIMEN IS NOT REPRESENTATIVE OF LOWER RESPIRATORY SECRETIONS. PLEASE RECOLLECT. Results Called to: Nicholes Calamity [id number]    Report Status 01/27/2015 FINAL  Final     Labs: Basic Metabolic Panel:  Recent Labs Lab 01/27/15 0020 01/27/15 1359 01/28/15 0345 01/29/15 0358  NA 137 138 141 141  K 4.3 4.5 4.3 4.3  CL 103 110 112 112  CO2 26 25 25 23   GLUCOSE 69* 82 84 91  BUN 21 13 9 12   CREATININE 1.50* 1.13* 1.11* 1.07  CALCIUM 9.4 8.6 8.6 8.5  MG  --   --  1.8  --    Liver Function Tests:  Recent Labs Lab 01/27/15 0020 01/27/15 1359 01/28/15 0345 01/29/15 0358  AST 15 16 14 13   ALT 13 10 10 10   ALKPHOS 108 91 83 83  BILITOT 0.3 0.3 0.2* 0.4  PROT 8.1 6.9 6.1 6.1  ALBUMIN 4.0 3.2* 2.9* 2.8*   No results for input(s): LIPASE, AMYLASE in the last 168 hours. No results for input(s): AMMONIA in the last 168 hours. CBC:  Recent Labs Lab 01/27/15 0020 01/27/15 1359 01/28/15 0345  WBC 8.7 5.5 5.7  NEUTROABS  --   --  2.8  HGB 14.0 13.3 11.8*  HCT 42.4 40.4 36.5  MCV 95.1 96.7 95.5  PLT 364 264 260   Cardiac Enzymes: No results for input(s): CKTOTAL, CKMB, CKMBINDEX, TROPONINI in the last 168 hours. BNP: BNP (last 3 results)  Recent Labs  01/27/15 0020  BNP 16.2    ProBNP (last 3 results) No results for input(s): PROBNP in the last 8760 hours.  CBG:  Recent Labs Lab 01/28/15 1607 01/28/15 1958 01/29/15 0038 01/29/15 0521 01/29/15 0805  GLUCAP 122* 139* 132* 98 88       Signed:  Dia Crawford, MD [facility] [phone] pager

## 2015-01-29 NOTE — Evaluation (Signed)
Physical Therapy Evaluation Patient Details Name: Whitney Marks MRN: [id number] DOB: 12-16-1960 Today's Date: 01/29/2015   History of Present Illness  Pt admitted with acute on chronic respiratory failure, likely due to overmedication. PMH: anxiety, depression, fibromyalgia, neuromuscular disorder, trach, OSA.  Clinical Impression  Patient demonstrates deficits in functional mobility as indicated below. Will benefit from continued skilled PT to address deficits and maximize function. Will see as indicated and progress as tolerated. OF NOTE: VSS on room air, 97% with activity, HR 60s    Follow Up Recommendations No PT follow up    Equipment Recommendations  None recommended by PT    Recommendations for Other Services       Precautions / Restrictions Precautions Precautions: Fall Restrictions Weight Bearing Restrictions: No      Mobility  Bed Mobility Overal bed mobility: Modified Independent             General bed mobility comments: increased time to perform  Transfers Overall transfer level: Needs assistance Equipment used: None Transfers: Sit to/from Stand Sit to Stand: Min guard         General transfer comment: min guard for initial stability in elevation to standing  Ambulation/Gait Ambulation/Gait assistance: Min guard Ambulation Distance (Feet): 40 Feet Assistive device: None Gait Pattern/deviations: Step-through pattern;Decreased stride length;Drifts right/left;Narrow base of support (increased lateral sway) Gait velocity: decreased Gait velocity interpretation: Below normal speed for age/gender General Gait Details: some instability noted secondary to reported LE pain, no evidence of LOB  Stairs            Wheelchair Mobility    Modified Rankin (Stroke Patients Only)       Balance Overall balance assessment: No apparent balance deficits (not formally assessed)                                           Pertinent  Vitals/Pain Pain Assessment: 0-10 Pain Score: 10-Worst pain ever (no evidence of physiologic distress (HR stable 60s)) Pain Location: hips, back and legs Pain Descriptors / Indicators: Discomfort Pain Intervention(s): Limited activity within patient's tolerance;Monitored during session;Repositioned;Relaxation    Home Living Family/patient expects to be discharged to:: Private residence Living Arrangements: Spouse/significant other Available Help at Discharge: Personal care attendant;Family;Available PRN/intermittently Type of Home: House Home Access: Stairs to enter Entrance Stairs-Rails: None Entrance Stairs-Number of Steps: 1 Home Layout: One level Home Equipment: Walker - 2 wheels;Cane - single point;Bedside commode;Shower seat - built in;Grab bars - tub/shower Additional Comments: walk in shower, std toilet with riser    Prior Function Level of Independence: Needs assistance   Gait / Transfers Assistance Needed: amblated without device  ADL's / Homemaking Assistance Needed: dressing assist provided  Comments: has aide 1 hr care attendent     Hand Dominance   Dominant Hand: Right    Extremity/Trunk Assessment               Lower Extremity Assessment: Overall WFL for tasks assessed (painful from fibromyualgia per pt)         Communication      Cognition Arousal/Alertness: Awake/alert Behavior During Therapy: Flat affect                        General Comments      Exercises        Assessment/Plan    PT Assessment Patient needs continued PT  services  PT Diagnosis Difficulty walking;Acute pain   PT Problem List Decreased activity tolerance;Decreased mobility;Cardiopulmonary status limiting activity  PT Treatment Interventions DME instruction;Gait training;Functional mobility training;Therapeutic activities;Therapeutic exercise;Balance training;Patient/family education   PT Goals (Current goals can be found in the Care Plan section) Acute  Rehab PT Goals Patient Stated Goal: to go home PT Goal Formulation: With patient Time For Goal Achievement: 02/12/15 Potential to Achieve Goals: Good    Frequency Min 3X/week   Barriers to discharge        Co-evaluation               End of Session   Activity Tolerance: Patient tolerated treatment well;Patient limited by fatigue Patient left: in chair;with call bell/phone within reach Nurse Communication: Mobility status         Time: 1194-1740 PT Time Calculation (min) (ACUTE ONLY): 27 min   Charges:   PT Evaluation $Initial PT Evaluation Tier I: 1 Procedure     PT G CodesDuncan Dull 02-16-15, 9:09 AM Alben Deeds, PT DPT  [phone]

## 2015-01-29 NOTE — Trach Care Team (Signed)
[place] Progression Note   Patient Details Name: Whitney Marks MRN: [id number] DOB: [birth year] Today's Date: 01/29/2015   Tracheostomy Assessment    Tracheostomy Shiley 4 mm Uncuffed (Active)  Site Care Cleansed;Dressing applied 01/28/2015  8:24 PM  Inner Cannula Care Changed/new 01/28/2015  8:24 PM  Ties Assessment Clean;Dry;Secure 01/28/2015  8:24 PM  Emergency Equipment at bedside Yes 01/28/2015  8:24 PM     Tracheostomy Other (Comment) 0.5 mm Uncuffed (Active)  Status Other (Comment) 01/29/2015 11:39 AM  Site Assessment Clean;Dry 01/29/2015 11:39 AM  Site Care Open to air 01/29/2015 11:39 AM  Inner Cannula Care No inner cannula 01/29/2015 11:39 AM  Ties Assessment Other (Comment) 01/29/2015  8:30 AM  Cuff pressure (cm) 0 cm 01/29/2015 11:39 AM  Trach Changed Yes 01/29/2015  4:09 AM  Emergency Equipment at bedside Yes 01/29/2015 11:39 AM     Care Needs     Respiratory Therapy O2 Device: Not Delivered SpO2: 95 %     Speech Language Pathology  SLP chart review complete: Patient ready for SLP services (potentially - followed at [facility], dysphagia? )   Physical Therapy Ambulation/Gait assistance: Min guard PT Recommendation/Assessment: Patient needs continued PT services Follow Up Recommendations: No PT follow up PT equipment: None recommended by PT    Occupational Therapy OT Recommendation/Assessment: Patient does not need any further OT services Follow Up Recommendations: No OT follow up OT Equipment: None recommended by OT    Nutritional Patient's Current Diet: Regular, Thickened liquids    Case Management/Social Work Level of patient care prior to hospitalization: Home-Home Health Living status: Spouse Insurance payer: Medicare Anticipated discharge disposition: [place] (DC 3/31)    Provider [place] Team/Provider Recommendations [place] Team Members Present-  Ciro Backer, RT, Alvino Blood, SW,  Herbie Baltimore, SLP    None. Has a trach with a  button and followed by ENT at [facility].          Constancia Geeting, Jaci Carrel (scribe for team) 01/29/2015, 1:50 PM

## 2015-02-10 IMAGING — CR DG CHEST 1V PORT
1 series · 1 of 1 positions shown · non-contrast
Comparison: Prior radiograph from 01/12/2014

CLINICAL DATA: Evaluate endotracheal tube, shortness of breath

EXAM:
PORTABLE CHEST - 1 VIEW

[AP]
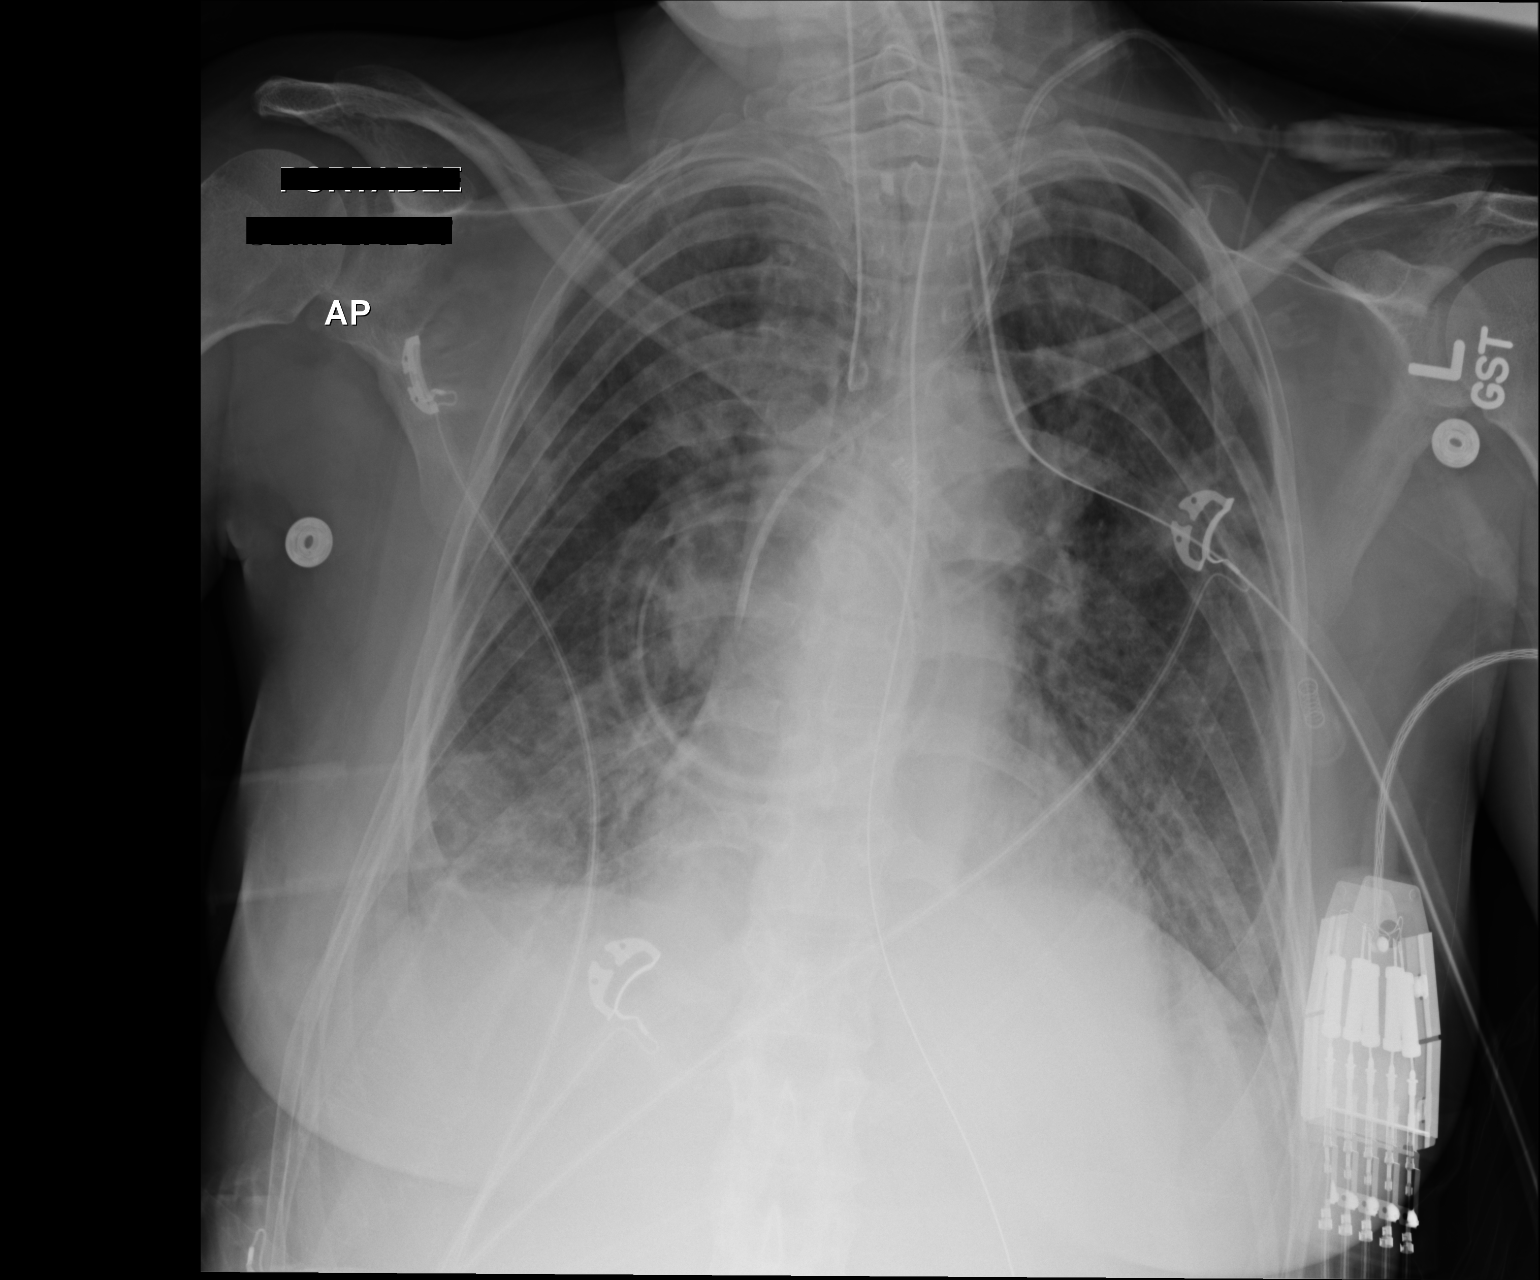

[1 of 1 positions shown; findings below may reference images not displayed]

FINDINGS: Tip of the endotracheal tube is positioned 2.9 cm above the carina.
Enteric tube courses into the abdomen. Left IJ central venous
catheter terminates over the mid distal SVC. Mild cardiomegaly is
stable.

Lungs remain mildly hypoinflated. Patchy bilateral lower lobe
airspace opacities are not significantly changed. No pulmonary
edema. There is probable small right pleural effusion. No
pneumothorax.

Osseous structures are unchanged.
IMPRESSION: 1. Tip of the endotracheal tube 2.9 cm above the carina.
2. No significant interval change in bibasilar airspace disease,
right greater than left.
3. Probable small right pleural effusion.

## 2015-02-11 IMAGING — DX DG CHEST 1V PORT
1 series · 1 of 1 positions shown · non-contrast
Comparison: 01/13/2014 and earlier priors

CLINICAL DATA: Check endotracheal tube position.

EXAM:
PORTABLE CHEST - 1 VIEW

[portable]
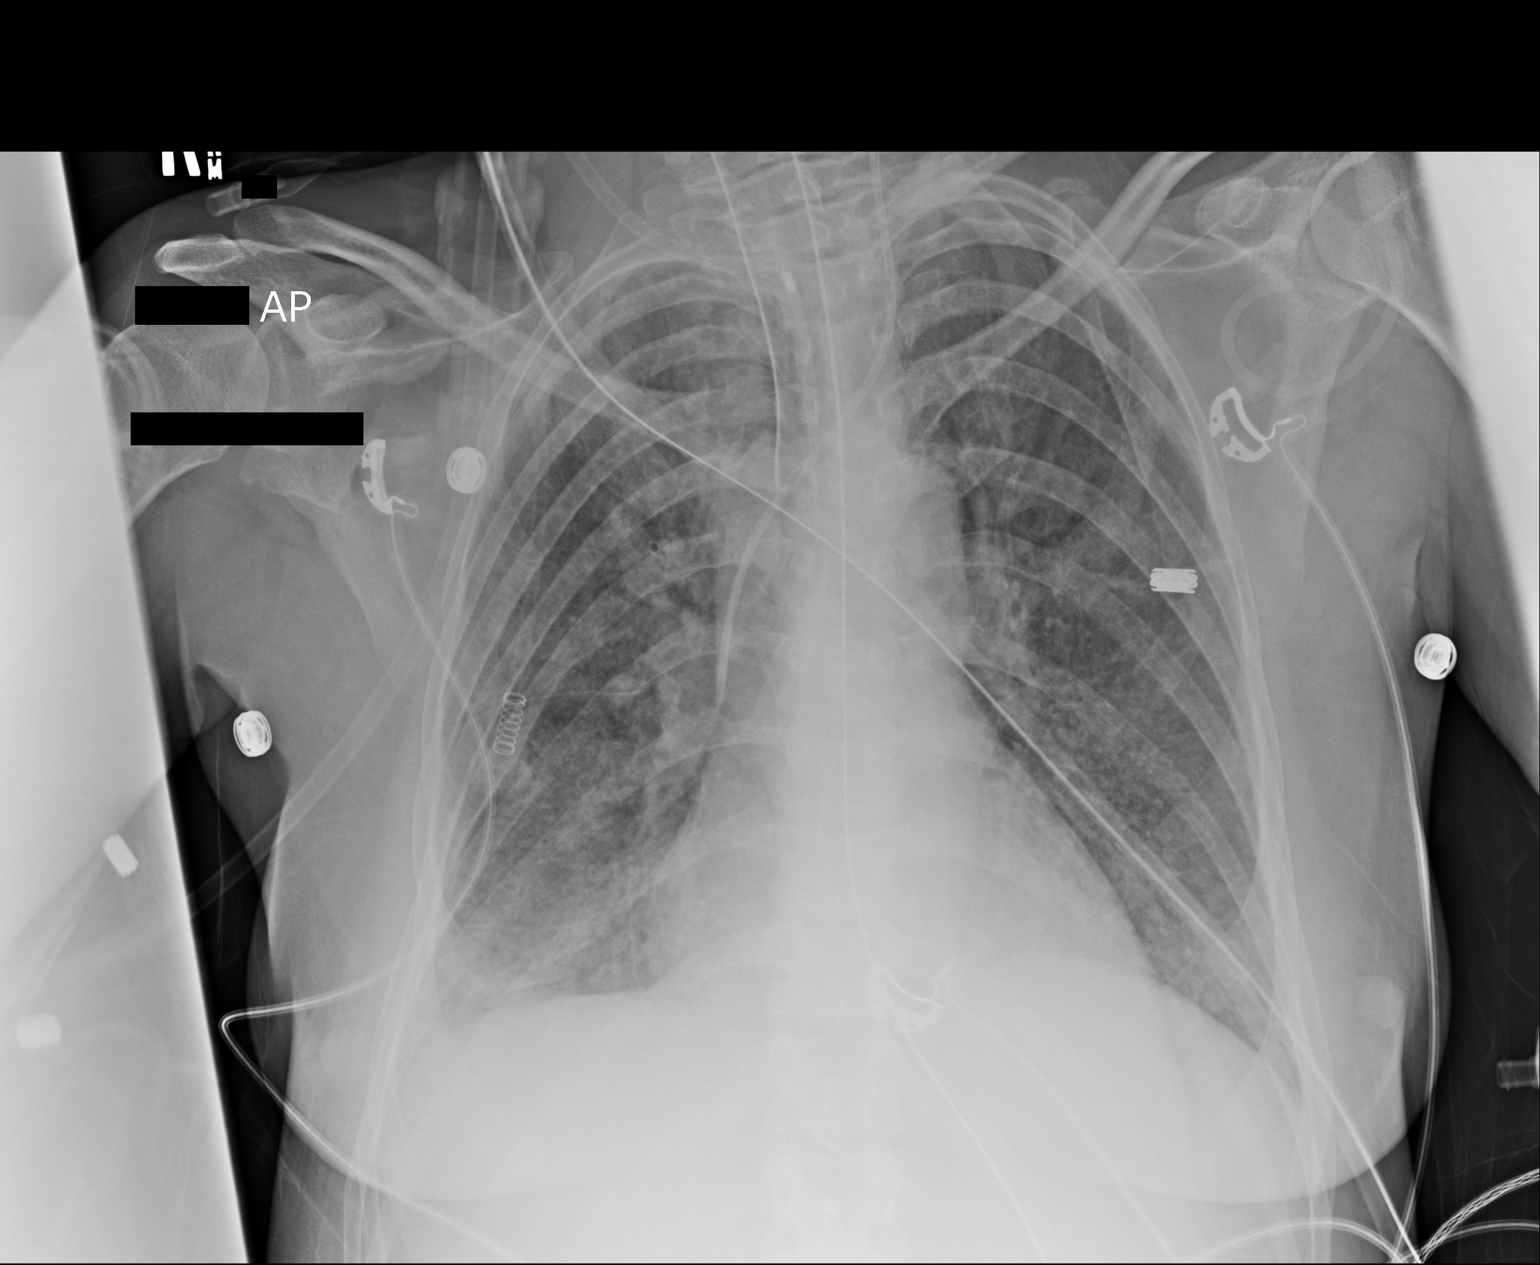

[1 of 1 positions shown; findings below may reference images not displayed]

FINDINGS: Endotracheal tube is 2.6 cm above the carina. Left IJ central venous
catheter terminates in the distal superior vena cava, in stable
position. Nasogastric tube courses into the stomach in continues
below the edge of the image.

The heart size appears normal. Mediastinal and hilar contours are
normal. The knee pulmonary vascularity appears congested. There is
diffuse interstitial prominence and there are persistent bibasilar
airspace opacities. No significant change in aeration of the lungs.
No visible pleural effusion. Negative for pneumothorax.
IMPRESSION: 1. No change in aeration of the lungs. Persistent diffuse
interstitial prominence and bibasilar airspace opacities.
2. Endotracheal tube is 2.6 cm above the carina.

## 2015-02-12 IMAGING — CR DG CHEST 1V PORT
1 series · 1 of 1 positions shown · non-contrast
Comparison: DG CHEST 1V PORT dated 01/15/2014; DG CHEST 1V PORT
dated 01/09/2014; DG CHEST 1V PORT dated 11/01/2011

CLINICAL DATA: Tracheostomy placement.

EXAM:
PORTABLE CHEST - 1 VIEW

[AP]
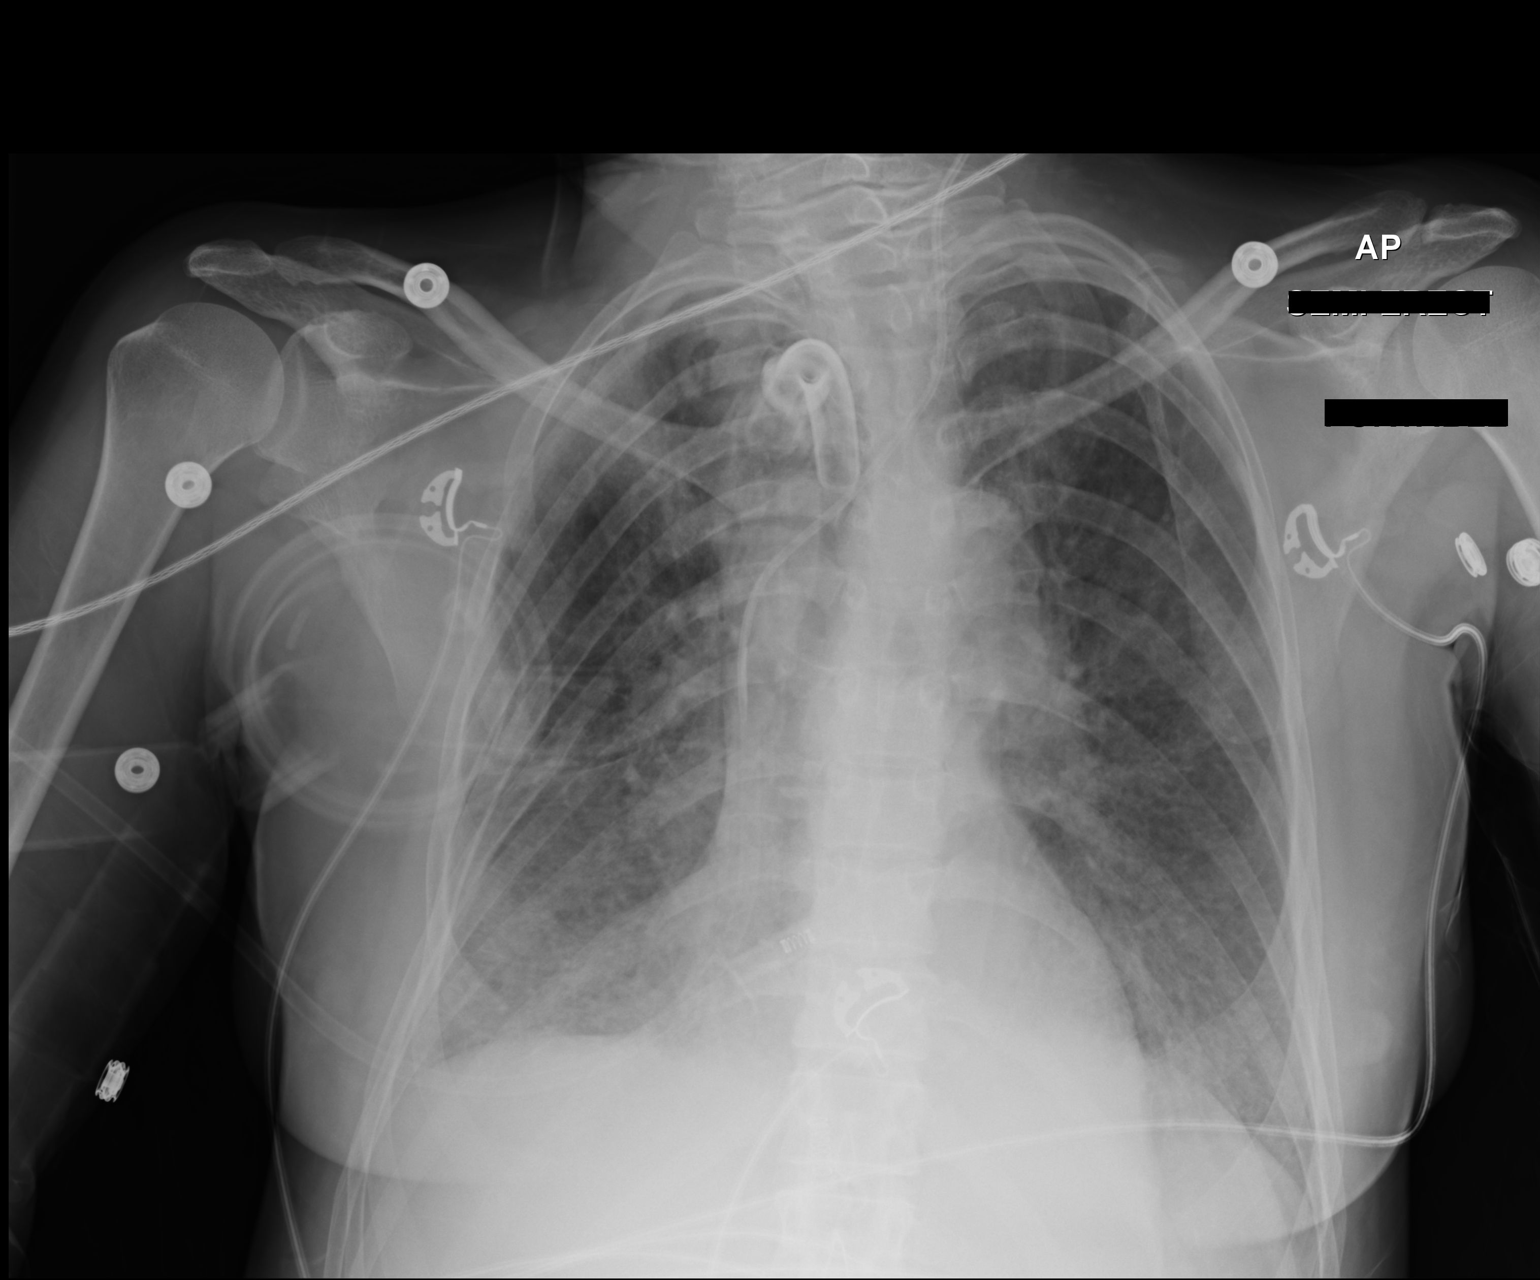

[1 of 1 positions shown; findings below may reference images not displayed]

FINDINGS: Endotracheal tube has been replaced with a tracheostomy tube which
projects over the tracheal air shadow. No significant abnormal
pneumomediastinum or pneumothorax.

Left IJ line tip: SVC. Interstitial accentuation bilaterally with
indistinct airspace opacities at the lung bases and right lung apex.
Heart size within normal limits for projection.
IMPRESSION: 1. Stable interstitial accentuation in the lungs favoring the lung
bases, with indistinct basilar airspace opacity and mild airspace
opacity at the right lung apex.
2. Tracheostomy tube in place without complicating feature.

## 2015-02-12 IMAGING — CR DG CHEST 1V PORT
1 series · 1 of 1 positions shown · non-contrast
Comparison: January 14, 2014

CLINICAL DATA: Hypoxia

EXAM:
PORTABLE CHEST - 1 VIEW

[AP]
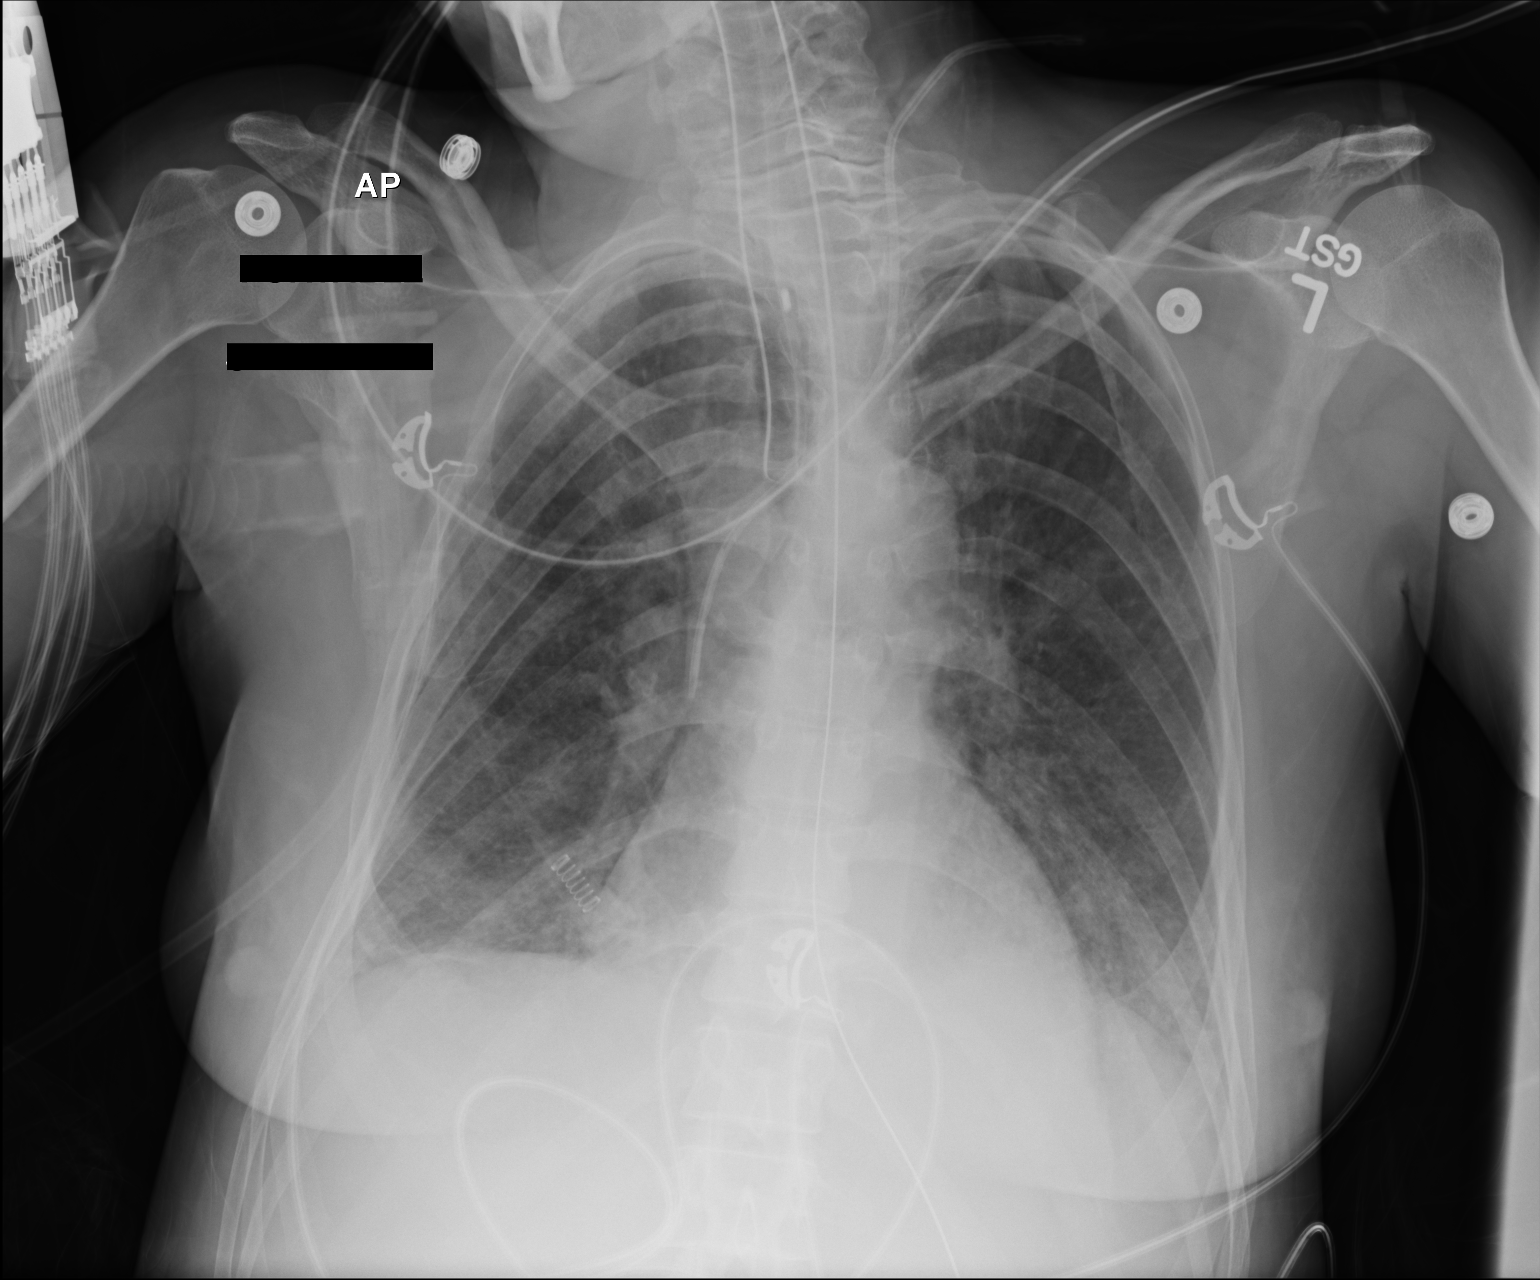

[1 of 1 positions shown; findings below may reference images not displayed]

FINDINGS: Endotracheal tube tip is 2.8 cm above the carina. Central catheter
tip is in the superior vena cava near the cavoatrial junction.
Nasogastric tube tip and side port are below the diaphragm. No
pneumothorax. There is bibasilar interstitial edema. Lungs are
otherwise clear. Heart is upper normal in size with normal pulmonary
vascularity. No adenopathy.
IMPRESSION: Tube and catheter positions as described without pneumothorax.
Persistent bibasilar edema. No new opacity.

## 2015-02-15 IMAGING — DX DG CHEST 1V PORT
1 series · 1 of 1 positions shown · non-contrast
Comparison: DG CHEST 1V PORT dated 01/17/2014;

CLINICAL DATA: Respiratory failure

EXAM:
PORTABLE CHEST - 1 VIEW

[portable]
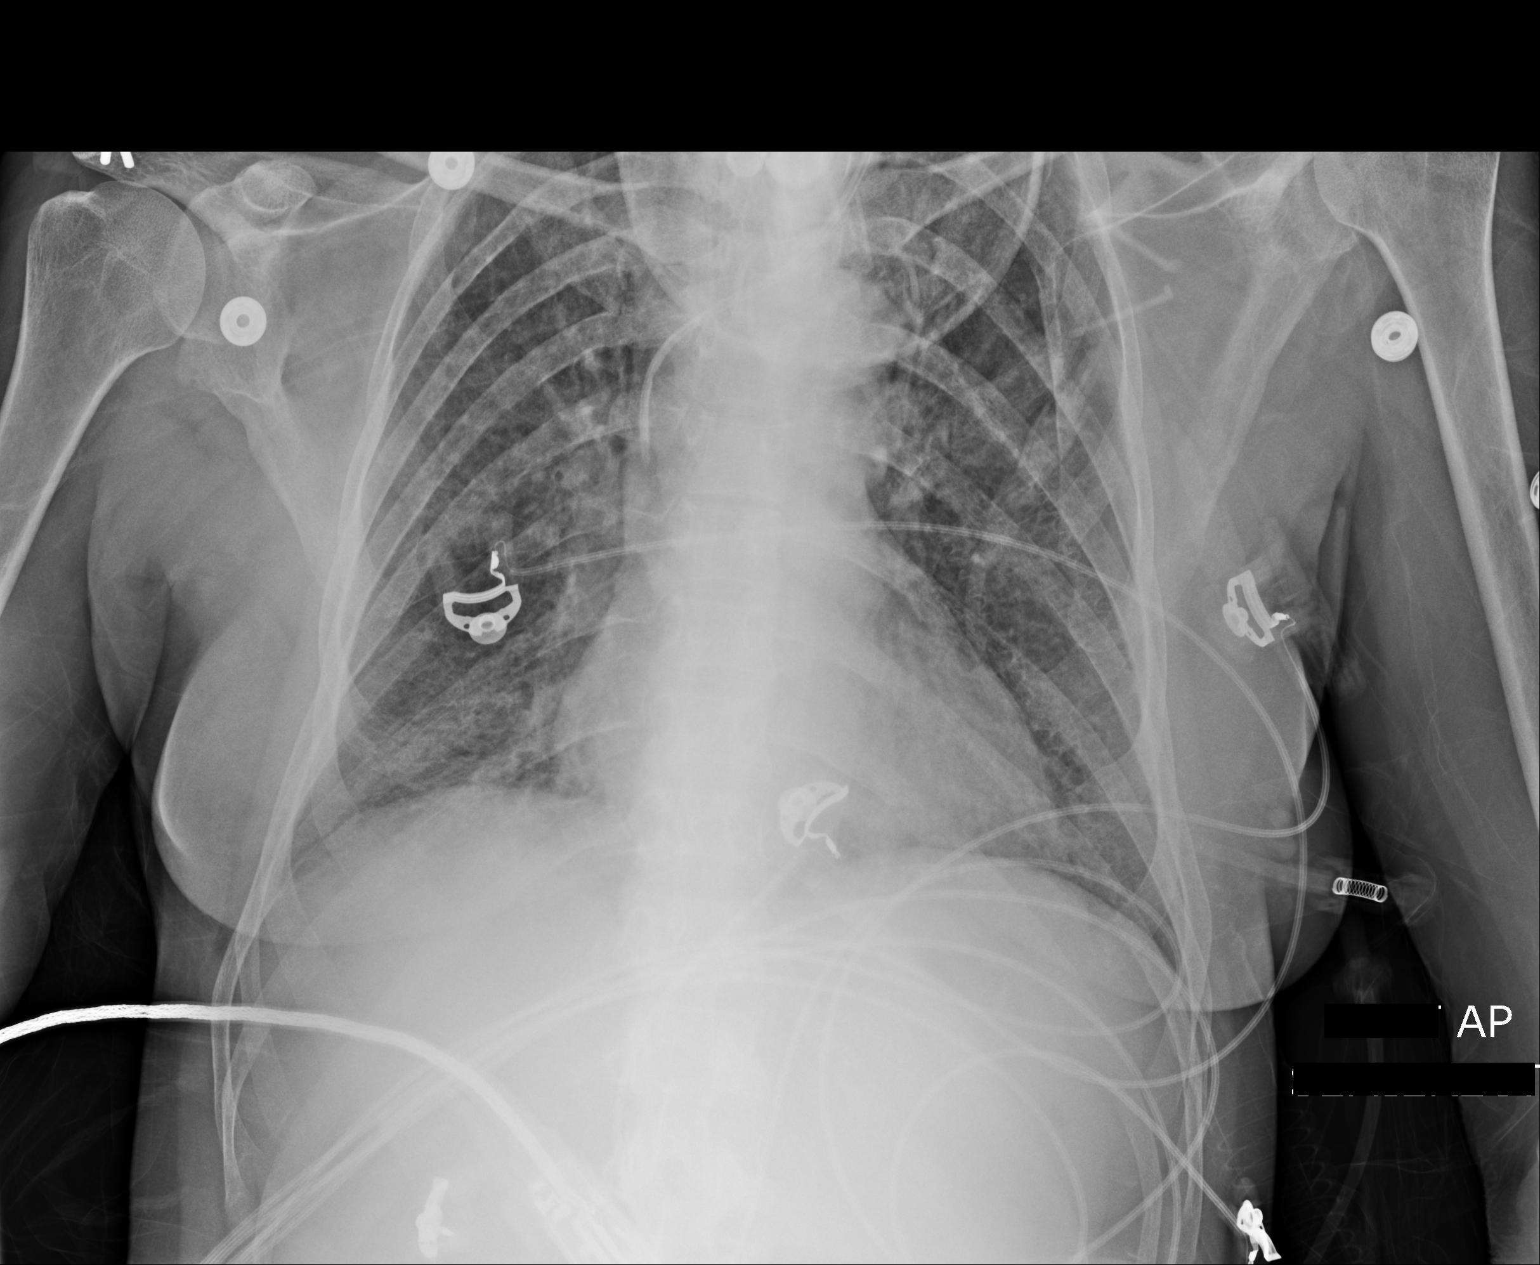

[1 of 1 positions shown; findings below may reference images not displayed]

DG CHEST 1V PORT
dated 01/16/2014; DG CHEST 1V PORT dated 01/15/2014; DG CHEST 1V PORT
dated 10/30/2011; DG CHEST 1V PORT dated 11/01/2011
FINDINGS: There is a tracheostomy tube in satisfactory position. There is a
left-sided jugular central venous catheter with the tip projecting
over the SVC.

There is no focal consolidation, pleural effusion or pneumothorax.
There is bilateral mild interstitial thickening which is similar to
the prior exam. Stable cardiomediastinal silhouette. Unremarkable
osseous structures.
IMPRESSION: Bilateral diffuse interstitial thickening more prominent of the lung
bases most consistent with interstitial edema. The appearance is
similar to the prior examination.

## 2015-10-27 ENCOUNTER — Emergency Department (HOSPITAL_COMMUNITY): Payer: Medicare Other

## 2015-10-27 ENCOUNTER — Emergency Department (HOSPITAL_COMMUNITY)
Admission: EM | Admit: 2015-10-27 | Discharge: 2015-10-27 | Disposition: A | Discharge status: Home / Self Care | Payer: Medicare Other | Attending: Emergency Medicine | Admitting: Emergency Medicine

## 2015-10-27 ENCOUNTER — Encounter (HOSPITAL_COMMUNITY): Payer: Self-pay | Admitting: *Deleted

## 2015-10-27 DIAGNOSIS — Z8701 Personal history of pneumonia (recurrent): Secondary | ICD-10-CM | POA: Diagnosis not present

## 2015-10-27 DIAGNOSIS — Z7951 Long term (current) use of inhaled steroids: Secondary | ICD-10-CM | POA: Diagnosis not present

## 2015-10-27 DIAGNOSIS — G8929 Other chronic pain: Secondary | ICD-10-CM | POA: Insufficient documentation

## 2015-10-27 DIAGNOSIS — Z8541 Personal history of malignant neoplasm of cervix uteri: Secondary | ICD-10-CM | POA: Insufficient documentation

## 2015-10-27 DIAGNOSIS — Z79899 Other long term (current) drug therapy: Secondary | ICD-10-CM | POA: Insufficient documentation

## 2015-10-27 DIAGNOSIS — M1990 Unspecified osteoarthritis, unspecified site: Secondary | ICD-10-CM | POA: Insufficient documentation

## 2015-10-27 DIAGNOSIS — F329 Major depressive disorder, single episode, unspecified: Secondary | ICD-10-CM | POA: Insufficient documentation

## 2015-10-27 DIAGNOSIS — F419 Anxiety disorder, unspecified: Secondary | ICD-10-CM | POA: Insufficient documentation

## 2015-10-27 DIAGNOSIS — R52 Pain, unspecified: Secondary | ICD-10-CM

## 2015-10-27 DIAGNOSIS — N390 Urinary tract infection, site not specified: Secondary | ICD-10-CM | POA: Diagnosis not present

## 2015-10-27 DIAGNOSIS — R109 Unspecified abdominal pain: Secondary | ICD-10-CM | POA: Diagnosis present

## 2015-10-27 LAB — URINE MICROSCOPIC-ADD ON

## 2015-10-27 LAB — COMPREHENSIVE METABOLIC PANEL
ALT: 7 U/L — AB (ref 14–54)
ANION GAP: 8 (ref 5–15)
AST: 11 U/L — ABNORMAL LOW (ref 15–41)
Albumin: 3.7 g/dL (ref 3.5–5.0)
Alkaline Phosphatase: 105 U/L (ref 38–126)
BUN: 13 mg/dL (ref 6–20)
CHLORIDE: 108 mmol/L (ref 101–111)
CO2: 29 mmol/L (ref 22–32)
Calcium: 9 mg/dL (ref 8.9–10.3)
Creatinine, Ser: 1.03 mg/dL — ABNORMAL HIGH (ref 0.44–1.00)
GFR calc non Af Amer: >60 mL/min (ref >60)
Glucose, Bld: 104 mg/dL — ABNORMAL HIGH (ref 65–99)
POTASSIUM: 4.1 mmol/L (ref 3.5–5.1)
SODIUM: 145 mmol/L (ref 135–145)
Total Bilirubin: 0.4 mg/dL (ref 0.3–1.2)
Total Protein: 7.5 g/dL (ref 6.5–8.1)

## 2015-10-27 LAB — LIPASE, BLOOD: LIPASE: 25 U/L (ref 11–51)

## 2015-10-27 LAB — URINALYSIS, ROUTINE W REFLEX MICROSCOPIC
Glucose, UA: NEGATIVE mg/dL
Ketones, ur: NEGATIVE mg/dL
NITRITE: NEGATIVE
PH: 6 (ref 5.0–8.0)
Protein, ur: 30 mg/dL — AB
Specific Gravity, Urine: 1.03 (ref 1.005–1.030)

## 2015-10-27 LAB — CBC
HCT: 41.7 % (ref 36.0–46.0)
HEMOGLOBIN: 13.1 g/dL (ref 12.0–15.0)
MCH: 31 pg (ref 26.0–34.0)
MCHC: 31.4 g/dL (ref 30.0–36.0)
MCV: 98.8 fL (ref 78.0–100.0)
PLATELETS: 301 10*3/uL (ref 150–400)
RBC: 4.22 MIL/uL (ref 3.87–5.11)
RDW: 13.5 % (ref 11.5–15.5)
WBC: 9.5 10*3/uL (ref 4.0–10.5)

## 2015-10-27 MED ORDER — LEVOFLOXACIN 500 MG PO TABS
500.0000 mg | ORAL_TABLET | Freq: Once | ORAL | Status: AC
  Administered 2015-10-27: 500 mg via ORAL
  Filled 2015-10-27: qty 1

## 2015-10-27 MED ORDER — HYDROMORPHONE HCL 1 MG/ML IJ SOLN
0.5000 mg | Freq: Once | INTRAMUSCULAR | Status: AC
  Administered 2015-10-27: 0.5 mg via INTRAVENOUS
  Filled 2015-10-27: qty 1

## 2015-10-27 MED ORDER — PROMETHAZINE HCL 25 MG PO TABS: 25.0000 mg | ORAL_TABLET | Freq: Four times a day (QID) | ORAL | Status: DC | PRN

## 2015-10-27 MED ORDER — ONDANSETRON HCL 4 MG/2ML IJ SOLN
4.0000 mg | Freq: Once | INTRAMUSCULAR | Status: AC
  Administered 2015-10-27: 4 mg via INTRAVENOUS
  Filled 2015-10-27: qty 2

## 2015-10-27 MED ORDER — OXYCODONE-ACETAMINOPHEN 5-325 MG PO TABS
1.0000 | ORAL_TABLET | Freq: Once | ORAL | Status: AC
  Administered 2015-10-27: 1 via ORAL
  Filled 2015-10-27: qty 1

## 2015-10-27 MED ORDER — HYDROMORPHONE HCL 1 MG/ML IJ SOLN
1.0000 mg | Freq: Once | INTRAMUSCULAR | Status: AC
  Administered 2015-10-27: 1 mg via INTRAVENOUS
  Filled 2015-10-27: qty 1

## 2015-10-27 MED ORDER — LEVOFLOXACIN 500 MG PO TABS: 500.0000 mg | ORAL_TABLET | Freq: Every day | ORAL | Status: DC

## 2015-10-27 NOTE — ED Notes (Signed)
Bed: [id number] Expected date:  Expected time:  Means of arrival:  Comments: Flank pain

## 2015-10-27 NOTE — ED Notes (Signed)
Pt escorted to discharge window. Pt verbalized understanding discharge instructions. In no acute distress.  

## 2015-10-27 NOTE — Discharge Instructions (Signed)
Follow up with your md for recheck in 2-3 days °

## 2015-10-27 NOTE — ED Notes (Signed)
Pt to CT

## 2015-10-27 NOTE — ED Notes (Signed)
Delay in lab draw,  Pt in bathroom. 

## 2015-10-27 NOTE — ED Provider Notes (Signed)
CSN: [id number]     Arrival date & time 10/27/15  1145 History   First MD Initiated Contact with Patient 10/27/15 1301     Chief Complaint  Patient presents with  . Flank Pain     (Consider location/radiation/quality/duration/timing/severity/associated sxs/prior Treatment) Patient is a 54 y.o. female presenting with flank pain. The history is provided by the patient (Patient complains of left flank pain today. She has a chronic cough 10).  Flank Pain This is a new problem. The current episode started 12 to 24 hours ago. The problem occurs constantly. The problem has not changed since onset.Pertinent negatives include no chest pain, no abdominal pain and no headaches. Nothing aggravates the symptoms. Nothing relieves the symptoms.    Past Medical History  Diagnosis Date  . Pneumonia   . Chronic pain   . Arthritis   . Difficult intubation   . Shortness of breath   . Sleep apnea   . 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[place])   . Recurrent upper respiratory infection (URI)   . Headache(784.0)   . Anxiety   . Neuromuscular disorder ([place])   . Fibromyalgia   . Depression   . AKI (acute kidney injury) [facility])    Past Surgical History  Procedure Laterality Date  . Neck surgery    . Laryngoscopy N/A 01/24/2014    Procedure: MICRO LARYNGOSCOPY;  Surgeon: Melida Quitter, MD;  Location: [place];  Service: ENT;  Laterality: N/A;  MICRO DIRECT LARYNGOSCOPY   History reviewed. No pertinent family history. Social History  Substance Use Topics  . Smoking status: Never Smoker   . Smokeless tobacco: None  . Alcohol Use: Yes   OB History    No data available     Review of Systems  Constitutional: Negative for appetite change and fatigue.  HENT: Negative for congestion, ear discharge and sinus pressure.   Eyes: Negative for discharge.  Respiratory: Negative for cough.   Cardiovascular: Negative for chest pain.  Gastrointestinal: Negative for abdominal pain and diarrhea.  Genitourinary: Positive for flank  pain. Negative for frequency and hematuria.  Musculoskeletal: Negative for back pain.  Skin: Negative for rash.  Neurological: Negative for seizures and headaches.  Psychiatric/Behavioral: Negative for hallucinations.      Allergies  Codeine; Pentazocine lactate; Ranitidine hcl; and Tobramycin  Home Medications   Prior to Admission medications   Medication Sig Start Date End Date Taking? Authorizing Provider  dexamethasone (DECADRON) 0.1 % ophthalmic solution Place 5 drops into both eyes 2 (two) times daily. 09/30/15  Yes Historical Provider, MD  diazepam (VALIUM) 10 MG tablet Take 5-10 mg by mouth 3 (three) times daily. Take 5 mg by mouth twice daily & 10 mg at bedtime 09/21/15  Yes Historical Provider, MD  FLOVENT HFA 110 MCG/ACT inhaler Inhale 2 puffs into the lungs 2 (two) times daily. 09/29/15  Yes Historical Provider, MD  FLUoxetine (PROZAC) 10 MG capsule Take 2 capsules (20 mg total) by mouth daily. Patient taking differently: Take 10 mg by mouth daily.  01/29/15  Yes Allie Bossier, MD  OPANA ER, CRUSH RESISTANT, 10 MG T12A 12 hr tablet Take 10 mg by mouth every 12 (twelve) hours. 10/06/15  Yes Historical Provider, MD  oxyCODONE-acetaminophen (PERCOCET) 10-325 MG tablet Take 1 tablet by mouth 3 (three) times daily as needed for pain.  10/06/15  Yes Historical Provider, MD  pregabalin (LYRICA) 150 MG capsule Take 150 mg by mouth daily as needed (pain in legs).    Yes Historical Provider, MD  Dorette Grate  Z 0.004 % SOLN ophthalmic solution Place 1 drop into both eyes 2 (two) times daily.  01/07/14  Yes Historical Provider, MD  traZODone (DESYREL) 100 MG tablet Take 100 mg by mouth at bedtime. 09/23/15  Yes Historical Provider, MD  VOLTAREN 1 % GEL Apply 4 g topically 4 (four) times daily as needed (pain).  10/06/15  Yes Historical Provider, MD  diazepam (VALIUM) 5 MG tablet Take 1 tablet (5 mg total) by mouth every 8 (eight) hours as needed for anxiety. Patient not taking: Reported on  10/27/2015 01/29/15   Allie Bossier, MD  esomeprazole (NEXIUM) 40 MG capsule Take 1 capsule (40 mg total) by mouth daily at 12 noon. Patient not taking: Reported on 01/27/2015 05/20/14   Dellia Nims, MD  HYDROcodone-acetaminophen (NORCO) 5-325 MG per tablet Take 1 tablet by mouth every 8 (eight) hours as needed for moderate pain. Patient not taking: Reported on 10/27/2015 01/29/15   Allie Bossier, MD  levofloxacin (LEVAQUIN) 500 MG tablet Take 1 tablet (500 mg total) by mouth daily. 10/27/15   Milton Ferguson, MD  morphine (MS CONTIN) 15 MG 12 hr tablet Take 1 tablet (15 mg total) by mouth every 12 (twelve) hours. Patient not taking: Reported on 10/27/2015 01/29/15   Allie Bossier, MD  promethazine (PHENERGAN) 25 MG tablet Take 1 tablet (25 mg total) by mouth every 6 (six) hours as needed for nausea or vomiting. 10/27/15   Milton Ferguson, MD  traZODone (DESYREL) 50 MG tablet Take 1 tablet (50 mg total) by mouth at bedtime. Patient not taking: Reported on 10/27/2015 01/29/15   Allie Bossier, MD   BP 118/75 mmHg  Pulse 58  Temp(Src) 97.7 F (36.5 C) (Oral)  Resp 20  SpO2 91% Physical Exam  Constitutional: She is oriented to person, place, and time. She appears well-developed.  HENT:  Head: Normocephalic.  Eyes: Conjunctivae and EOM are normal. No scleral icterus.  Neck: Neck supple. No thyromegaly present.  Cardiovascular: Normal rate and regular rhythm.  Exam reveals no gallop and no friction rub.   No murmur heard. Pulmonary/Chest: No stridor. She has no wheezes. She has no rales. She exhibits no tenderness.  Abdominal: She exhibits no distension. There is no tenderness. There is no rebound.  Musculoskeletal: Normal range of motion. She exhibits no edema.  Tender left flank  Lymphadenopathy:    She has no cervical adenopathy.  Neurological: She is oriented to person, place, and time. She exhibits normal muscle tone. Coordination normal.  Skin: No rash noted. No erythema.  Psychiatric:  She has a normal mood and affect. Her behavior is normal.    ED Course  Procedures (including critical care time) Labs Review Labs Reviewed  COMPREHENSIVE METABOLIC PANEL - Abnormal; Notable for the following:    Glucose, Bld 104 (*)    Creatinine, Ser 1.03 (*)    AST 11 (*)    ALT 7 (*)    All other components within normal limits  URINALYSIS, ROUTINE W REFLEX MICROSCOPIC (NOT AT [facility]) - Abnormal; Notable for the following:    Color, Urine AMBER (*)    APPearance TURBID (*)    Hgb urine dipstick SMALL (*)    Bilirubin Urine SMALL (*)    Protein, ur 30 (*)    Leukocytes, UA SMALL (*)    All other components within normal limits  URINE MICROSCOPIC-ADD ON - Abnormal; Notable for the following:    Squamous Epithelial / LPF 6-30 (*)    Bacteria, UA MANY (*)  Crystals CA OXALATE CRYSTALS (*)    All other components within normal limits  URINE CULTURE  LIPASE, BLOOD  CBC    Imaging Review Ct Renal Stone Study  10/27/2015  CLINICAL DATA:  54 year old female with left flank pain EXAM: CT ABDOMEN AND PELVIS WITHOUT CONTRAST TECHNIQUE: Multidetector CT imaging of the abdomen and pelvis was performed following the standard protocol without IV contrast. COMPARISON:  Prior CT abdomen/ pelvis 09/22/2006 FINDINGS: Lower Chest: New mild patchy airspace opacification in both the right and left lower lobe with superimposed atelectasis on the left. There is a trace left pleural effusion as well. Mild cardiomegaly. No pericardial effusion. Unremarkable visualized distal thoracic esophagus. Abdomen: Unenhanced CT was performed per clinician order. Lack of IV contrast limits sensitivity and specificity, especially for evaluation of abdominal/pelvic solid viscera. Within these limitations, unremarkable CT appearance of the stomach, duodenum, spleen, adrenal glands and pancreas. Normal hepatic contour and morphology. No discrete hepatic lesion. Gallbladder is unremarkable. No intra or extrahepatic  biliary ductal dilatation. No evidence of hydronephrosis or nephrolithiasis. No evidence of obstruction or focal bowel wall thickening. Normal appendix in the right lower quadrant. The terminal ileum is unremarkable. Pelvis: Unremarkable uterus, at adnexa and bladder. Bones/Soft Tissues: No acute fracture or aggressive appearing lytic or blastic osseous lesion. Vascular: Limited evaluation in the absence of intravenous contrast. No significant atherosclerotic vascular calcification or aneurysm. IMPRESSION: 1. Positive for tree-in-bud micro nodularity in both lower lobes with some associated atelectasis in the left lower lobe and a small left-sided pleural effusion. Findings raise concern for a possible multi lobar infectious/inflammatory process such as a bronchopneumonia, or potentially sub clinical aspiration. 2. No evidence of hydronephrosis or nephrolithiasis. 3. Cardiomegaly. Electronically Signed   By: Jacqulynn Cadet M.D.   On: 10/27/2015 13:55   I have personally reviewed and evaluated these images and lab results as part of my medical decision-making.   EKG Interpretation None      MDM   Final diagnoses:  Pain  UTI (lower urinary tract infection)   Urine consistent with UTI. CT scan showed possible pneumonia. Patient has a cough all the time no change recently. Patient will have urine cultured she will be put on Levaquin and will follow-up with her PCP    Milton Ferguson, MD 10/27/15 1520

## 2015-10-27 NOTE — ED Notes (Addendum)
Per ems pt is from home, currently has trach, denies sob, difficulty breathing, n/v. Pt c/o left flank pain since waking up this morning. Pain radiates up into left axilla. Pain 10/10. daughter repots pt has been falling recently. Pt was being tested for kidney issues, was suppose to bring a urine sample back to her doctor but she never did. Last opana taken today 12/27 at 1000.  Pt also reports "black discharge" that she notes in her underwear x3 months, today she is "really concerned about this and she is tired of ruining her underwear".   meds  Diazepam fluxatine opanas Oxycodone trazadone   Upon rn assessment pt denies dysuria, denies blood in urine or stool.

## 2015-10-28 LAB — URINE CULTURE: Special Requests: NORMAL

## 2016-01-27 ENCOUNTER — Emergency Department (HOSPITAL_COMMUNITY)
Admission: EM | Admit: 2016-01-27 | Discharge: 2016-01-27 | Disposition: A | Discharge status: Home / Self Care | Payer: Medicare Other | Attending: Emergency Medicine | Admitting: Emergency Medicine

## 2016-01-27 ENCOUNTER — Emergency Department (HOSPITAL_COMMUNITY): Payer: Medicare Other

## 2016-01-27 ENCOUNTER — Encounter (HOSPITAL_COMMUNITY): Payer: Self-pay

## 2016-01-27 DIAGNOSIS — R1084 Generalized abdominal pain: Secondary | ICD-10-CM | POA: Diagnosis not present

## 2016-01-27 DIAGNOSIS — R112 Nausea with vomiting, unspecified: Secondary | ICD-10-CM | POA: Diagnosis not present

## 2016-01-27 DIAGNOSIS — Z79899 Other long term (current) drug therapy: Secondary | ICD-10-CM | POA: Diagnosis not present

## 2016-01-27 DIAGNOSIS — G8929 Other chronic pain: Secondary | ICD-10-CM | POA: Insufficient documentation

## 2016-01-27 DIAGNOSIS — Z8709 Personal history of other diseases of the respiratory system: Secondary | ICD-10-CM | POA: Insufficient documentation

## 2016-01-27 DIAGNOSIS — Z8701 Personal history of pneumonia (recurrent): Secondary | ICD-10-CM | POA: Diagnosis not present

## 2016-01-27 DIAGNOSIS — Z792 Long term (current) use of antibiotics: Secondary | ICD-10-CM | POA: Diagnosis not present

## 2016-01-27 DIAGNOSIS — M1990 Unspecified osteoarthritis, unspecified site: Secondary | ICD-10-CM | POA: Diagnosis not present

## 2016-01-27 DIAGNOSIS — F419 Anxiety disorder, unspecified: Secondary | ICD-10-CM | POA: Diagnosis not present

## 2016-01-27 DIAGNOSIS — R1110 Vomiting, unspecified: Secondary | ICD-10-CM | POA: Diagnosis present

## 2016-01-27 DIAGNOSIS — Z8541 Personal history of malignant neoplasm of cervix uteri: Secondary | ICD-10-CM | POA: Diagnosis not present

## 2016-01-27 DIAGNOSIS — F329 Major depressive disorder, single episode, unspecified: Secondary | ICD-10-CM | POA: Insufficient documentation

## 2016-01-27 DIAGNOSIS — M797 Fibromyalgia: Secondary | ICD-10-CM | POA: Insufficient documentation

## 2016-01-27 LAB — COMPREHENSIVE METABOLIC PANEL
ALK PHOS: 119 U/L (ref 38–126)
ALT: 9 U/L — AB (ref 14–54)
ANION GAP: 13 (ref 5–15)
AST: 15 U/L (ref 15–41)
Albumin: 4.6 g/dL (ref 3.5–5.0)
BILIRUBIN TOTAL: 0.4 mg/dL (ref 0.3–1.2)
BUN: 19 mg/dL (ref 6–20)
CALCIUM: 10.2 mg/dL (ref 8.9–10.3)
CO2: 26 mmol/L (ref 22–32)
CREATININE: 1.26 mg/dL — AB (ref 0.44–1.00)
Chloride: 106 mmol/L (ref 101–111)
GFR, EST AFRICAN AMERICAN: 55 mL/min — AB (ref >60)
GFR, EST NON AFRICAN AMERICAN: 47 mL/min — AB (ref >60)
Glucose, Bld: 114 mg/dL — ABNORMAL HIGH (ref 65–99)
Potassium: 3.9 mmol/L (ref 3.5–5.1)
Sodium: 145 mmol/L (ref 135–145)
TOTAL PROTEIN: 9.2 g/dL — AB (ref 6.5–8.1)

## 2016-01-27 LAB — URINALYSIS, ROUTINE W REFLEX MICROSCOPIC
BILIRUBIN URINE: NEGATIVE
GLUCOSE, UA: NEGATIVE mg/dL
HGB URINE DIPSTICK: NEGATIVE
Ketones, ur: NEGATIVE mg/dL
Leukocytes, UA: NEGATIVE
Nitrite: NEGATIVE
Protein, ur: NEGATIVE mg/dL
pH: 7.5 (ref 5.0–8.0)

## 2016-01-27 LAB — CBC
HCT: 44.5 % (ref 36.0–46.0)
HEMOGLOBIN: 14.9 g/dL (ref 12.0–15.0)
MCH: 30.5 pg (ref 26.0–34.0)
MCHC: 33.5 g/dL (ref 30.0–36.0)
MCV: 91 fL (ref 78.0–100.0)
PLATELETS: 478 10*3/uL — AB (ref 150–400)
RBC: 4.89 MIL/uL (ref 3.87–5.11)
RDW: 14.3 % (ref 11.5–15.5)
WBC: 7.7 10*3/uL (ref 4.0–10.5)

## 2016-01-27 LAB — LIPASE, BLOOD: Lipase: 33 U/L (ref 11–51)

## 2016-01-27 MED ORDER — OXYCODONE-ACETAMINOPHEN 5-325 MG PO TABS
2.0000 | ORAL_TABLET | Freq: Once | ORAL | Status: AC
  Administered 2016-01-27: 2 via ORAL
  Filled 2016-01-27: qty 2

## 2016-01-27 MED ORDER — PROMETHAZINE HCL 12.5 MG PO TABS: 12.5000 mg | ORAL_TABLET | Freq: Four times a day (QID) | ORAL | Status: DC | PRN

## 2016-01-27 MED ORDER — IOPAMIDOL (ISOVUE-300) INJECTION 61%
100.0000 mL | Freq: Once | INTRAVENOUS | Status: AC | PRN
  Administered 2016-01-27: 100 mL via INTRAVENOUS

## 2016-01-27 MED ORDER — SODIUM CHLORIDE 0.9 % IV BOLUS (SEPSIS)
1000.0000 mL | Freq: Once | INTRAVENOUS | Status: AC
  Administered 2016-01-27: 1000 mL via INTRAVENOUS

## 2016-01-27 MED ORDER — METOCLOPRAMIDE HCL 5 MG/ML IJ SOLN
10.0000 mg | Freq: Once | INTRAMUSCULAR | Status: AC
  Administered 2016-01-27: 10 mg via INTRAVENOUS
  Filled 2016-01-27: qty 2

## 2016-01-27 MED ORDER — DICYCLOMINE HCL 10 MG PO CAPS
10.0000 mg | ORAL_CAPSULE | Freq: Once | ORAL | Status: AC
  Administered 2016-01-27: 10 mg via ORAL
  Filled 2016-01-27: qty 1

## 2016-01-27 MED ORDER — IOHEXOL 300 MG/ML  SOLN
25.0000 mL | Freq: Once | INTRAMUSCULAR | Status: AC | PRN
  Administered 2016-01-27: 25 mL via ORAL

## 2016-01-27 MED ORDER — LORAZEPAM 2 MG/ML IJ SOLN: 1.0000 mg | Freq: Once | INTRAMUSCULAR | Status: DC

## 2016-01-27 NOTE — ED Notes (Signed)
Per EMS, pt from home.  Pt c/o n/v/d with abdominal pain x 3 days.  No fever. Chronic back pain.  Poor po intake  Vitals:  140/96, hr 78, resp 16, 94%ra.

## 2016-01-27 NOTE — ED Notes (Signed)
Patient states she would like something for pain-patient complaining of pain in legs and back

## 2016-01-27 NOTE — ED Notes (Signed)
Lab delay - pt actively vomiting.

## 2016-01-27 NOTE — ED Provider Notes (Signed)
CSN: [id number]     Arrival date & time 01/27/16  1320 History   First MD Initiated Contact with Patient 01/27/16 1501     Chief Complaint  Patient presents with  . Emesis  . Abdominal Pain     (Consider location/radiation/quality/duration/timing/severity/associated sxs/prior Treatment) HPI Comments: 55 year old female with past medical history including trach, fibromyalgia, chronic pain on chronic narcotics who presents with vomiting, diarrhea, and abdominal pain. The patient reports a three-day history of recurrent vomiting and nonbloody diarrhea associated with abdominal pain located across her lower abdomen. The abdominal pain is severe. She denies any fevers or cough/cold symptoms. She does report a chronic history of constipation. She currently complains of both abdominal pain as well as her chronic pain in her legs and back. She denies any recent travel, sick contacts, or recent antibiotic use. No urinary symptoms.  Patient is a 55 y.o. female presenting with vomiting and abdominal pain. The history is provided by the patient.  Emesis Associated symptoms: abdominal pain   Abdominal Pain Associated symptoms: vomiting     Past Medical History  Diagnosis Date  . Pneumonia   . Chronic pain   . Arthritis   . Difficult intubation   . Shortness of breath   . Sleep apnea   . 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[place])   . Recurrent upper respiratory infection (URI)   . Headache(784.0)   . Anxiety   . Neuromuscular disorder ([place])   . Fibromyalgia   . Depression   . AKI (acute kidney injury) [facility])    Past Surgical History  Procedure Laterality Date  . Neck surgery    . Laryngoscopy N/A 01/24/2014    Procedure: MICRO LARYNGOSCOPY;  Surgeon: Melida Quitter, MD;  Location: [facility];  Service: ENT;  Laterality: N/A;  MICRO DIRECT LARYNGOSCOPY   History reviewed. No pertinent family history. Social History  Substance Use Topics  . Smoking status: Never Smoker   . Smokeless tobacco: None  . Alcohol Use:  Yes   OB History    No data available     Review of Systems  Gastrointestinal: Positive for vomiting and abdominal pain.   10 Systems reviewed and are negative for acute change except as noted in the HPI.    Allergies  Codeine; Lyrica; Pentazocine lactate; Ranitidine hcl; Tobramycin; and Zofran  Home Medications   Prior to Admission medications   Medication Sig Start Date End Date Taking? Authorizing Provider  diazepam (VALIUM) 10 MG tablet Take 5-10 mg by mouth 3 (three) times daily. Take 5 mg by mouth twice daily & 10 mg at bedtime 09/21/15  Yes Historical Provider, MD  FLUoxetine (PROZAC) 10 MG capsule Take 2 capsules (20 mg total) by mouth daily. Patient taking differently: Take 10 mg by mouth daily.  01/29/15  Yes Allie Bossier, MD  OPANA ER, CRUSH RESISTANT, 10 MG T12A 12 hr tablet Take 10 mg by mouth every 12 (twelve) hours. 10/06/15  Yes Historical Provider, MD  oxyCODONE-acetaminophen (PERCOCET) 10-325 MG tablet Take 1 tablet by mouth 3 (three) times daily as needed for pain.  10/06/15  Yes Historical Provider, MD  traZODone (DESYREL) 100 MG tablet Take 100-200 mg by mouth at bedtime.  09/23/15  Yes Historical Provider, MD  VOLTAREN 1 % GEL Apply 4 g topically 4 (four) times daily as needed (pain).  10/06/15  Yes Historical Provider, MD  levofloxacin (LEVAQUIN) 500 MG tablet Take 1 tablet (500 mg total) by mouth daily. 10/27/15   Milton Ferguson, MD  promethazine [facility])  12.5 MG tablet Take 1 tablet (12.5 mg total) by mouth every 6 (six) hours as needed for nausea or vomiting. 01/27/16   Sharlett Iles, MD   BP 139/68 mmHg  Pulse 79  Temp(Src) 98 F (36.7 C) (Oral)  Resp 16  SpO2 100% Physical Exam  Constitutional: She is oriented to person, place, and time. She appears well-developed and well-nourished. No distress.  Resting comfortably, emesis on blankets  HENT:  Head: Normocephalic and atraumatic.  Moist mucous membranes  Eyes: Conjunctivae are normal. Pupils  are equal, round, and reactive to light.  Neck: Neck supple.  Cardiovascular: Normal rate, regular rhythm and normal heart sounds.   No murmur heard. Pulmonary/Chest: Effort normal and breath sounds normal.  Trach in place  Abdominal: Soft. She exhibits no distension. There is no rebound and no guarding.  Hyperactive BS; TTP LLQ  Musculoskeletal: She exhibits no edema.  Neurological: She is alert and oriented to person, place, and time.  Fluent speech  Skin: Skin is warm and dry.  Psychiatric: She has a normal mood and affect. Judgment normal.  Depressed mood  Nursing note and vitals reviewed.   ED Course  Procedures (including critical care time) Labs Review Labs Reviewed  COMPREHENSIVE METABOLIC PANEL - Abnormal; Notable for the following:    Glucose, Bld 114 (*)    Creatinine, Ser 1.26 (*)    Total Protein 9.2 (*)    ALT 9 (*)    GFR calc non Af Amer 47 (*)    GFR calc Af Amer 55 (*)    All other components within normal limits  CBC - Abnormal; Notable for the following:    Platelets 478 (*)    All other components within normal limits  URINALYSIS, ROUTINE W REFLEX MICROSCOPIC (NOT AT [facility]) - Abnormal; Notable for the following:    Specific Gravity, Urine >1.046 (*)    All other components within normal limits  LIPASE, BLOOD    Imaging Review Ct Abdomen Pelvis W Contrast  01/27/2016  CLINICAL DATA:  Left lower quadrant abdominal pain and tenderness, nausea, vomiting and diarrhea for 3 days. History of cervical cancer per electronic medical records. EXAM: CT ABDOMEN AND PELVIS WITH CONTRAST TECHNIQUE: Multidetector CT imaging of the abdomen and pelvis was performed using the standard protocol following bolus administration of intravenous contrast. CONTRAST:  167mL ISOVUE-300 IOPAMIDOL (ISOVUE-300) INJECTION 61% COMPARISON:  10/27/2015 CT abdomen/pelvis. FINDINGS: Lower chest: There is diffuse ground-glass centrilobular nodularity at both lung bases, which is not appreciably  changed since 09/22/2006. Mild scarring versus atelectasis at both lung bases. Hepatobiliary: Normal liver with no liver mass. Normal gallbladder with no radiopaque cholelithiasis. No biliary ductal dilatation. Pancreas: Normal, with no mass or duct dilation. Spleen: Normal size. No mass. Adrenals/Urinary Tract: Normal adrenals. No hydronephrosis. There are a few subcentimeter hypodense renal cortical lesions in both kidneys, too small to characterize, probably benign renal cysts. Normal bladder. Stomach/Bowel: Grossly normal stomach. Normal caliber small bowel with no small bowel wall thickening. Normal appendix. Normal large bowel with no diverticulosis, large bowel wall thickening or pericolonic fat stranding. Vascular/Lymphatic: Normal caliber abdominal aorta. Patent portal, splenic, hepatic and renal veins. No pathologically enlarged lymph nodes in the abdomen or pelvis. Reproductive: Grossly normal retroverted uterus.  No adnexal mass. Other: No pneumoperitoneum, ascites or focal fluid collection. Musculoskeletal: No aggressive appearing focal osseous lesions. IMPRESSION: 1. No acute abnormality. No evidence of bowel obstruction or acute bowel inflammation. No hydronephrosis. 2. Chronic ground-glass centrilobular nodularity at the lung bases,  unchanged back to 2007, probably postinflammatory. Electronically Signed   By: Ilona Sorrel M.D.   On: 01/27/2016 17:12   I have personally reviewed and evaluated these lab results as part of my medical decision-making.   EKG Interpretation None     Medications  oxyCODONE-acetaminophen (PERCOCET/ROXICET) 5-325 MG per tablet 2 tablet (not administered)  metoCLOPramide (REGLAN) injection 10 mg (not administered)  metoCLOPramide (REGLAN) injection 10 mg (10 mg Intravenous Given 01/27/16 1628)  sodium chloride 0.9 % bolus 1,000 mL (0 mLs Intravenous Stopped 01/27/16 1813)  oxyCODONE-acetaminophen (PERCOCET/ROXICET) 5-325 MG per tablet 2 tablet (2 tablets Oral Given  01/27/16 1629)  dicyclomine (BENTYL) capsule 10 mg (10 mg Oral Given 01/27/16 1628)  iohexol (OMNIPAQUE) 300 MG/ML solution 25 mL (25 mLs Oral Contrast Given 01/27/16 1637)  iopamidol (ISOVUE-300) 61 % injection 100 mL (100 mLs Intravenous Contrast Given 01/27/16 1647)    MDM   Final diagnoses:  Non-intractable vomiting with nausea, vomiting of unspecified type  Generalized abdominal pain   Pt p/w 3d of ongoing Lower abdominal pain, vomiting, and diarrhea. On exam, she was resting comfortably, chronically ill-appearing but in no acute distress. Vital signs notable for hypertension. She had left lower quadrant tenderness and hyperactive bowel sounds. No peritonitis. Lab work shows normal WBC count, creatinine 1.26 which is not far from patient's baseline, normal LFTs and lipase. Given the location of the patient's pain in her chronic history of constipation, obtained CT of abdomen and pelvis to rule out diverticulitis. Gave the patient Reglan, and IV fluid bolus, Percocet, and Bentyl.  CT was negative for acute process to explain the patient's pain.  Patient was able to tolerate crackers and ginger ale without any vomiting. On reexamination she was comfortable. I discussed supportive care and provided with Phenergan to use at home. The patient later stated that she was out of her chronic pain medications. I instructed her to follow-up with her prescriber for any further refills. Patient voiced understanding and was discharged in satisfactory condition.  Sharlett Iles, MD 01/27/16 2014

## 2016-01-27 NOTE — ED Notes (Signed)
SALTINE CRACKERS AND GINGERALE GIVEN. PT TOLERATING WELL.

## 2016-02-20 ENCOUNTER — Emergency Department (HOSPITAL_COMMUNITY): Payer: Medicare Other

## 2016-02-20 ENCOUNTER — Encounter (HOSPITAL_COMMUNITY): Payer: Self-pay

## 2016-02-20 ENCOUNTER — Emergency Department (HOSPITAL_COMMUNITY)
Admission: EM | Admit: 2016-02-20 | Discharge: 2016-02-20 | Disposition: A | Discharge status: Home / Self Care | Payer: Medicare Other | Attending: Emergency Medicine | Admitting: Emergency Medicine

## 2016-02-20 DIAGNOSIS — Z7982 Long term (current) use of aspirin: Secondary | ICD-10-CM | POA: Diagnosis not present

## 2016-02-20 DIAGNOSIS — Z930 Tracheostomy status: Secondary | ICD-10-CM | POA: Diagnosis not present

## 2016-02-20 DIAGNOSIS — R05 Cough: Secondary | ICD-10-CM | POA: Diagnosis not present

## 2016-02-20 DIAGNOSIS — Z792 Long term (current) use of antibiotics: Secondary | ICD-10-CM | POA: Diagnosis not present

## 2016-02-20 DIAGNOSIS — R109 Unspecified abdominal pain: Secondary | ICD-10-CM | POA: Insufficient documentation

## 2016-02-20 DIAGNOSIS — M1990 Unspecified osteoarthritis, unspecified site: Secondary | ICD-10-CM | POA: Diagnosis not present

## 2016-02-20 DIAGNOSIS — Z8701 Personal history of pneumonia (recurrent): Secondary | ICD-10-CM | POA: Insufficient documentation

## 2016-02-20 DIAGNOSIS — Z8541 Personal history of malignant neoplasm of cervix uteri: Secondary | ICD-10-CM | POA: Diagnosis not present

## 2016-02-20 DIAGNOSIS — F329 Major depressive disorder, single episode, unspecified: Secondary | ICD-10-CM | POA: Insufficient documentation

## 2016-02-20 DIAGNOSIS — F1129 Opioid dependence with unspecified opioid-induced disorder: Secondary | ICD-10-CM

## 2016-02-20 DIAGNOSIS — Z79899 Other long term (current) drug therapy: Secondary | ICD-10-CM | POA: Insufficient documentation

## 2016-02-20 DIAGNOSIS — R112 Nausea with vomiting, unspecified: Secondary | ICD-10-CM | POA: Diagnosis present

## 2016-02-20 DIAGNOSIS — F1124 Opioid dependence with opioid-induced mood disorder: Secondary | ICD-10-CM | POA: Diagnosis not present

## 2016-02-20 DIAGNOSIS — M797 Fibromyalgia: Secondary | ICD-10-CM | POA: Diagnosis not present

## 2016-02-20 DIAGNOSIS — G8929 Other chronic pain: Secondary | ICD-10-CM | POA: Insufficient documentation

## 2016-02-20 DIAGNOSIS — F419 Anxiety disorder, unspecified: Secondary | ICD-10-CM | POA: Insufficient documentation

## 2016-02-20 LAB — URINALYSIS, ROUTINE W REFLEX MICROSCOPIC
Glucose, UA: NEGATIVE mg/dL
Hgb urine dipstick: NEGATIVE
KETONES UR: 15 mg/dL — AB
NITRITE: NEGATIVE
PROTEIN: NEGATIVE mg/dL
Specific Gravity, Urine: 1.03 (ref 1.005–1.030)
pH: 5.5 (ref 5.0–8.0)

## 2016-02-20 LAB — COMPREHENSIVE METABOLIC PANEL
ALBUMIN: 3.6 g/dL (ref 3.5–5.0)
ALT: 5 U/L — ABNORMAL LOW (ref 14–54)
ANION GAP: 14 (ref 5–15)
AST: 13 U/L — ABNORMAL LOW (ref 15–41)
Alkaline Phosphatase: 95 U/L (ref 38–126)
BILIRUBIN TOTAL: 1.1 mg/dL (ref 0.3–1.2)
BUN: 12 mg/dL (ref 6–20)
CO2: 23 mmol/L (ref 22–32)
Calcium: 9.3 mg/dL (ref 8.9–10.3)
Chloride: 109 mmol/L (ref 101–111)
Creatinine, Ser: 1.21 mg/dL — ABNORMAL HIGH (ref 0.44–1.00)
GFR calc non Af Amer: 50 mL/min — ABNORMAL LOW (ref >60)
GFR, EST AFRICAN AMERICAN: 58 mL/min — AB (ref >60)
GLUCOSE: 105 mg/dL — AB (ref 65–99)
POTASSIUM: 3.9 mmol/L (ref 3.5–5.1)
SODIUM: 146 mmol/L — AB (ref 135–145)
TOTAL PROTEIN: 7.9 g/dL (ref 6.5–8.1)

## 2016-02-20 LAB — CBC WITH DIFFERENTIAL/PLATELET
BASOS PCT: 0 %
Basophils Absolute: 0 10*3/uL (ref 0.0–0.1)
EOS ABS: 0 10*3/uL (ref 0.0–0.7)
Eosinophils Relative: 1 %
HCT: 38.5 % (ref 36.0–46.0)
Hemoglobin: 12.1 g/dL (ref 12.0–15.0)
Lymphocytes Relative: 22 %
Lymphs Abs: 1.2 10*3/uL (ref 0.7–4.0)
MCH: 28.9 pg (ref 26.0–34.0)
MCHC: 31.4 g/dL (ref 30.0–36.0)
MCV: 91.9 fL (ref 78.0–100.0)
MONO ABS: 0.6 10*3/uL (ref 0.1–1.0)
MONOS PCT: 10 %
Neutro Abs: 3.8 10*3/uL (ref 1.7–7.7)
Neutrophils Relative %: 67 %
Platelets: 380 10*3/uL (ref 150–400)
RBC: 4.19 MIL/uL (ref 3.87–5.11)
RDW: 14.4 % (ref 11.5–15.5)
WBC: 5.7 10*3/uL (ref 4.0–10.5)

## 2016-02-20 LAB — URINE MICROSCOPIC-ADD ON: Bacteria, UA: NONE SEEN

## 2016-02-20 LAB — LIPASE, BLOOD: Lipase: 31 U/L (ref 11–51)

## 2016-02-20 LAB — I-STAT CG4 LACTIC ACID, ED: LACTIC ACID, VENOUS: 1.29 mmol/L (ref 0.5–2.0)

## 2016-02-20 MED ORDER — MORPHINE SULFATE 15 MG PO TABS
30.0000 mg | ORAL_TABLET | Freq: Once | ORAL | Status: AC
  Administered 2016-02-20: 30 mg via ORAL
  Filled 2016-02-20: qty 2

## 2016-02-20 MED ORDER — SODIUM CHLORIDE 0.9 % IV BOLUS (SEPSIS)
1000.0000 mL | Freq: Once | INTRAVENOUS | Status: AC
  Administered 2016-02-20: 1000 mL via INTRAVENOUS

## 2016-02-20 MED ORDER — PROCHLORPERAZINE EDISYLATE 5 MG/ML IJ SOLN
10.0000 mg | Freq: Once | INTRAMUSCULAR | Status: AC
  Administered 2016-02-20: 10 mg via INTRAVENOUS
  Filled 2016-02-20: qty 2

## 2016-02-20 MED ORDER — METOCLOPRAMIDE HCL 5 MG/ML IJ SOLN
10.0000 mg | Freq: Once | INTRAMUSCULAR | Status: AC
  Administered 2016-02-20: 10 mg via INTRAVENOUS
  Filled 2016-02-20: qty 2

## 2016-02-20 NOTE — ED Notes (Signed)
Pt sleeping when RN entered room to reassess pt. Pt states she vomited up morphine pills into emesis bag. This RN looked in emesis bag and 60ml of liquid/bile in bag but no pills seen in emesis bag.

## 2016-02-20 NOTE — Progress Notes (Signed)
RT called to pt's room by RN stating pt felt SOB. Pt currently vomiting and saying it was difficult to breathe. Pt deep suctioned with no sputum obtained. Pt placed on 5L/28% trach collar for support. RT will continue to closely monitor.

## 2016-02-20 NOTE — Progress Notes (Signed)
ED MD called RT to ask to suction pt as she was having Lanza/yellow mucous/vomit. Pt deep suctioned with a #1F catheter and was unable to get anything. Pt continues to vomit but is in no respiratory distress at this time. Discussed with RN that #4 shiley has a non-disposable inner cannula so we do not have extra available but that pt's whole trach could be changed to a new # 4 so she would have an inner cannula or she could wait until she gets home as she is not in distress. RN to discuss with MD. RT will continue to closely monitor pt.

## 2016-02-20 NOTE — Discharge Instructions (Signed)
Nausea and Vomiting Nausea is a sick feeling that often comes before throwing up (vomiting). Vomiting is a reflex where stomach contents come out of your mouth. Vomiting can cause severe loss of body fluids (dehydration). Children and elderly adults can become dehydrated quickly, especially if they also have diarrhea. Nausea and vomiting are symptoms of a condition or disease. It is important to find the cause of your symptoms. CAUSES   Direct irritation of the stomach lining. This irritation can result from increased acid production (gastroesophageal reflux disease), infection, food poisoning, taking certain medicines (such as nonsteroidal anti-inflammatory drugs), alcohol use, or tobacco use.  Signals from the brain.These signals could be caused by a headache, heat exposure, an inner ear disturbance, increased pressure in the brain from injury, infection, a tumor, or a concussion, pain, emotional stimulus, or metabolic problems.  An obstruction in the gastrointestinal tract (bowel obstruction).  Illnesses such as diabetes, hepatitis, gallbladder problems, appendicitis, kidney problems, cancer, sepsis, atypical symptoms of a heart attack, or eating disorders.  Medical treatments such as chemotherapy and radiation.  Receiving medicine that makes you sleep (general anesthetic) during surgery. DIAGNOSIS Your caregiver may ask for tests to be done if the problems do not improve after a few days. Tests may also be done if symptoms are severe or if the reason for the nausea and vomiting is not clear. Tests may include:  Urine tests.  Blood tests.  Stool tests.  Cultures (to look for evidence of infection).  X-rays or other imaging studies. Test results can help your caregiver make decisions about treatment or the need for additional tests. TREATMENT You need to stay well hydrated. Drink frequently but in small amounts.You may wish to drink water, sports drinks, clear broth, or eat frozen  ice pops or gelatin dessert to help stay hydrated.When you eat, eating slowly may help prevent nausea.There are also some antinausea medicines that may help prevent nausea. HOME CARE INSTRUCTIONS   Take all medicine as directed by your caregiver.  If you do not have an appetite, do not force yourself to eat. However, you must continue to drink fluids.  If you have an appetite, eat a normal diet unless your caregiver tells you differently.  Eat a variety of complex carbohydrates (rice, wheat, potatoes, bread), lean meats, yogurt, fruits, and vegetables.  Avoid high-fat foods because they are more difficult to digest.  Drink enough water and fluids to keep your urine clear or pale yellow.  If you are dehydrated, ask your caregiver for specific rehydration instructions. Signs of dehydration may include:  Severe thirst.  Dry lips and mouth.  Dizziness.  Dark urine.  Decreasing urine frequency and amount.  Confusion.  Rapid breathing or pulse. SEEK IMMEDIATE MEDICAL CARE IF:   You have blood or Schuitema flecks (like coffee grounds) in your vomit.  You have black or bloody stools.  You have a severe headache or stiff neck.  You are confused.  You have severe abdominal pain.  You have chest pain or trouble breathing.  You do not urinate at least once every 8 hours.  You develop cold or clammy skin.  You continue to vomit for longer than 24 to 48 hours.  You have a fever. MAKE SURE YOU:   Understand these instructions.  Will watch your condition.  Will get help right away if you are not doing well or get worse.   This information is not intended to replace advice given to you by your health care provider. Make sure  you discuss any questions you have with your health care provider.   Document Released: 10/17/2005 Document Revised: 01/09/2012 Document Reviewed: 03/16/2011 Elsevier Interactive Patient Education 2016 [place]. Opioid Withdrawal Opioids are a  group of narcotic drugs. They include the street drug heroin. They also include pain medicines, such as morphine, hydrocodone, oxycodone, and fentanyl. Opioid withdrawal is a group of characteristic physical and mental signs and symptoms. It typically occurs if you have been using opioids daily for several weeks or longer and stop using or rapidly decrease use. Opioid withdrawal can also occur if you have used opioids daily for a long time and are given a medicine to block the effect.  SIGNS AND SYMPTOMS Opioid withdrawal includes three or more of the following symptoms:   Depressed, anxious, or irritable mood.  Nausea or vomiting.  Muscle aches or spasms.   Watery eyes.   Runny nose.  Dilated pupils, sweating, or hairs standing on end.  Diarrhea or intestinal cramping.  Yawning.   Fever.  Increased blood pressure.  Fast pulse.  Restlessness or trouble sleeping. These signs and symptoms occur within several hours of stopping or reducing short-acting opioids, such as heroin. They can occur within 3 days of stopping or reducing long-acting opioids, such as methadone. Withdrawal begins within minutes of receiving a drug that blocks the effects of opioids, such as naltrexone or naloxone. DIAGNOSIS  Opioid use disorder is diagnosed by your health care provider. You will be asked about your symptoms, drug and alcohol use, medical history, and use of medicines. A physical exam may be done. Lab tests may be ordered. Your health care provider may have you see a mental health professional.  TREATMENT  The treatment for opioid withdrawal is usually provided by medical doctors with special training in substance use disorders (addiction specialists). The following medicines may be included in treatment:  Opioids given in place of the abused opioid. They turn on opioid receptors in the brain and lessen or prevent withdrawal symptoms. They are gradually decreased (opioid substitution and  taper).  Non-opioids that can lessen certain opioid withdrawal symptoms. They may be used alone or with opioid substitution and taper. Successful long-term recovery usually requires medicine, counseling, and group support. HOME CARE INSTRUCTIONS   Take medicines only as directed by your health care provider.  Check with your health care provider before starting new medicines.  Keep all follow-up visits as directed by your health care provider. SEEK MEDICAL CARE IF:  You are not able to take your medicines as directed.  Your symptoms get worse.  You relapse. SEEK IMMEDIATE MEDICAL CARE IF:  You have serious thoughts about hurting yourself or others.  You have a seizure.  You lose consciousness.   This information is not intended to replace advice given to you by your health care provider. Make sure you discuss any questions you have with your health care provider.   Document Released: 10/20/2003 Document Revised: 11/07/2014 Document Reviewed: 10/30/2013 Elsevier Interactive Patient Education [employer].

## 2016-02-20 NOTE — Progress Notes (Signed)
RT called to pt's room by RN. Pt with a #4shiley trach brought in by EMS. Upon arrival to ED, pt began coughing frequently with small amount of clear/white thin secretions being coughed up. Pt had taken her inner cannula out at home approximately 1 hr prior to clean and forgot to put it back in. Pt in no respiratory distress at this time. Pt deep suctioned with a #74F catheter with minimal clear/white secretions removed. Pt spo2 96% on RA, does not require O2 at home. RT will closely monitor pt.

## 2016-02-20 NOTE — ED Notes (Signed)
Per EMS - pt from home. Pt called out for nausea/vomiting/diarrhea. Pt reports lower abd pain, dark urine, decreased freq of urination. Pt reports cough - pt is coughing up thick yellow secretions through tracheostomy.

## 2016-02-21 NOTE — ED Provider Notes (Signed)
CSN: [id number]     Arrival date & time 02/20/16  1454 History   First MD Initiated Contact with Patient 02/20/16 1500     Chief Complaint  Patient presents with  . Abdominal Pain  . Tracheostomy Tube Change     (Consider location/radiation/quality/duration/timing/severity/associated sxs/prior Treatment) Patient is a 55 y.o. female presenting with vomiting. The history is provided by the patient.  Emesis Severity:  Moderate Timing:  Constant Quality:  Stomach contents Progression:  Unchanged Chronicity:  Recurrent Recent urination:  Normal Relieved by:  Nothing Worsened by:  Nothing tried Ineffective treatments:  None tried Associated symptoms: abdominal pain (chronic that is worse since being unable to tolerate narcotic meds by mouth) and diarrhea (occasional)   Associated symptoms: no fever     Past Medical History  Diagnosis Date  . Pneumonia   . Chronic pain   . Arthritis   . Difficult intubation   . Shortness of breath   . Sleep apnea   . 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[place])   . Recurrent upper respiratory infection (URI)   . Headache(784.0)   . Anxiety   . Neuromuscular disorder ([place])   . Fibromyalgia   . Depression   . AKI (acute kidney injury) [facility])    Past Surgical History  Procedure Laterality Date  . Neck surgery    . Laryngoscopy N/A 01/24/2014    Procedure: MICRO LARYNGOSCOPY;  Surgeon: Melida Quitter, MD;  Location: [place];  Service: ENT;  Laterality: N/A;  MICRO DIRECT LARYNGOSCOPY   History reviewed. No pertinent family history. Social History  Substance Use Topics  . Smoking status: Never Smoker   . Smokeless tobacco: None  . Alcohol Use: Yes   OB History    No data available     Review of Systems  Respiratory: Positive for cough.   Gastrointestinal: Positive for vomiting, abdominal pain (chronic that is worse since being unable to tolerate narcotic meds by mouth) and diarrhea (occasional).  All other systems reviewed and are negative.     Allergies   Lyrica; Pentazocine lactate; Ranitidine hcl; Tobramycin; Zofran; and Codeine  Home Medications   Prior to Admission medications   Medication Sig Start Date End Date Taking? Authorizing Provider  aspirin EC 81 MG tablet Take 81 mg by mouth every 6 (six) hours as needed for moderate pain.   Yes Historical Provider, MD  diazepam (VALIUM) 10 MG tablet Take 5-10 mg by mouth 3 (three) times daily. Take 5 mg by mouth twice daily & 10 mg at bedtime 09/21/15  Yes Historical Provider, MD  FLUoxetine (PROZAC) 10 MG capsule Take 2 capsules (20 mg total) by mouth daily. Patient taking differently: Take 10 mg by mouth daily.  01/29/15  Yes Allie Bossier, MD  OPANA ER, CRUSH RESISTANT, 10 MG T12A 12 hr tablet Take 10 mg by mouth every 12 (twelve) hours. 10/06/15  Yes Historical Provider, MD  oxyCODONE-acetaminophen (PERCOCET) 10-325 MG tablet Take 1 tablet by mouth 3 (three) times daily as needed for pain.  10/06/15  Yes Historical Provider, MD  traZODone (DESYREL) 100 MG tablet Take 100-200 mg by mouth at bedtime.  09/23/15  Yes Historical Provider, MD  VOLTAREN 1 % GEL Apply 4 g topically 4 (four) times daily as needed (pain).  10/06/15  Yes Historical Provider, MD  levofloxacin (LEVAQUIN) 500 MG tablet Take 1 tablet (500 mg total) by mouth daily. 10/27/15   Milton Ferguson, MD  promethazine (PHENERGAN) 12.5 MG tablet Take 1 tablet (12.5 mg total) by mouth  every 6 (six) hours as needed for nausea or vomiting. 01/27/16   Sharlett Iles, MD   BP 108/74 mmHg  Pulse 63  Temp(Src) 97.5 F (36.4 C) (Oral)  Resp 26  SpO2 96% Physical Exam  Constitutional: She is oriented to person, place, and time. She appears well-developed and well-nourished. No distress.  HENT:  Head: Normocephalic.  Eyes: Conjunctivae are normal.  Neck: Neck supple. No tracheal deviation present.  Tracheostomy patent with nothing suctioned from catheter  Cardiovascular: Normal rate, regular rhythm and normal heart sounds.    Pulmonary/Chest: Effort normal and breath sounds normal. No respiratory distress. She has no wheezes.  Abdominal: Soft. Normal appearance. She exhibits no distension. There is no tenderness. There is no rigidity, no rebound, no guarding, no CVA tenderness, no tenderness at McBurney's point and negative Murphy's sign.  Neurological: She is alert and oriented to person, place, and time.  Skin: Skin is warm and dry.  Psychiatric: She has a normal mood and affect.    ED Course  Procedures (including critical care time) Labs Review Labs Reviewed  COMPREHENSIVE METABOLIC PANEL - Abnormal; Notable for the following:    Sodium 146 (*)    Glucose, Bld 105 (*)    Creatinine, Ser 1.21 (*)    AST 13 (*)    ALT 5 (*)    GFR calc non Af Amer 50 (*)    GFR calc Af Amer 58 (*)    All other components within normal limits  URINALYSIS, ROUTINE W REFLEX MICROSCOPIC (NOT AT [facility]) - Abnormal; Notable for the following:    APPearance TURBID (*)    Bilirubin Urine SMALL (*)    Ketones, ur 15 (*)    Leukocytes, UA SMALL (*)    All other components within normal limits  URINE MICROSCOPIC-ADD ON - Abnormal; Notable for the following:    Squamous Epithelial / LPF 0-5 (*)    All other components within normal limits  URINE CULTURE  CBC WITH DIFFERENTIAL/PLATELET  LIPASE, BLOOD  I-STAT CG4 LACTIC ACID, ED    Imaging Review Dg Chest 2 View  02/20/2016  CLINICAL DATA:  Cough EXAM: CHEST  2 VIEW COMPARISON:  01/27/2015 chest radiograph. FINDINGS: Tracheostomy tube tip overlies the tracheal air column at the thoracic inlet. Stable cardiomediastinal silhouette with normal heart size. No pneumothorax. No pleural effusion. No pulmonary edema. Curvilinear lucencies and opacities at both lung bases, not appreciably changed. No acute consolidative airspace disease. IMPRESSION: Stable chronic curvilinear lucencies and opacities at both lung bases, favor scarring and/ or bronchiectasis, such as due to recurrent  aspiration. No acute consolidative airspace disease. Electronically Signed   By: Ilona Sorrel M.D.   On: 02/20/2016 17:32   I have personally reviewed and evaluated these images and lab results as part of my medical decision-making.   EKG Interpretation None      MDM   Final diagnoses:  Non-intractable vomiting with nausea, vomiting of unspecified type  Opioid dependence with opioid-induced disorder [facility])    55 y.o. female presents with N/V/D over the last 2 days. She is trach dependent and has had increased secretions. Intermittent vomiting during ED course controlled by compazine and reglan. Able to tolerate MSIR by mouth. Labs reassuring with mild dehydration evident, recent imaging was negative and similar presentation with reassuring abdominal exam. She is concerned because she only has 2 percocet left at home, but review of narcotic database shows she has run out early and has been overusing.   Pt shows  many high risk features for factitious vomiting and drug seeking behavior during this visit. I explained she cannot receive refills for narcotic prescriptions from the ED and she expressed understanding. Discharged in good condition, ambulatory without difficulty and well appearing.    Leo Grosser, MD 02/21/16 [phone]

## 2016-02-22 LAB — URINE CULTURE: Culture: NO GROWTH

## 2016-12-12 DIAGNOSIS — J9690 Respiratory failure, unspecified, unspecified whether with hypoxia or hypercapnia: Secondary | ICD-10-CM | POA: Diagnosis not present

## 2016-12-12 DIAGNOSIS — J188 Other pneumonia, unspecified organism: Secondary | ICD-10-CM | POA: Diagnosis not present

## 2016-12-12 DIAGNOSIS — R1319 Other dysphagia: Secondary | ICD-10-CM | POA: Diagnosis not present

## 2016-12-12 DIAGNOSIS — Z930 Tracheostomy status: Secondary | ICD-10-CM | POA: Diagnosis not present

## 2016-12-12 DIAGNOSIS — J398 Other specified diseases of upper respiratory tract: Secondary | ICD-10-CM | POA: Diagnosis not present

## 2016-12-30 DIAGNOSIS — Z1231 Encounter for screening mammogram for malignant neoplasm of breast: Secondary | ICD-10-CM | POA: Diagnosis not present

## 2017-01-10 DIAGNOSIS — Z930 Tracheostomy status: Secondary | ICD-10-CM | POA: Diagnosis not present

## 2017-01-10 DIAGNOSIS — R490 Dysphonia: Secondary | ICD-10-CM | POA: Diagnosis not present

## 2017-01-10 DIAGNOSIS — Z430 Encounter for attention to tracheostomy: Secondary | ICD-10-CM | POA: Diagnosis not present

## 2017-01-10 DIAGNOSIS — J386 Stenosis of larynx: Secondary | ICD-10-CM | POA: Diagnosis not present

## 2017-01-11 DIAGNOSIS — Z930 Tracheostomy status: Secondary | ICD-10-CM | POA: Diagnosis not present

## 2017-01-11 DIAGNOSIS — J9690 Respiratory failure, unspecified, unspecified whether with hypoxia or hypercapnia: Secondary | ICD-10-CM | POA: Diagnosis not present

## 2017-01-11 DIAGNOSIS — J398 Other specified diseases of upper respiratory tract: Secondary | ICD-10-CM | POA: Diagnosis not present

## 2017-01-11 DIAGNOSIS — R1319 Other dysphagia: Secondary | ICD-10-CM | POA: Diagnosis not present

## 2017-01-11 DIAGNOSIS — J188 Other pneumonia, unspecified organism: Secondary | ICD-10-CM | POA: Diagnosis not present

## 2017-03-17 ENCOUNTER — Encounter: Payer: Self-pay | Admitting: Neurology

## 2017-03-17 ENCOUNTER — Ambulatory Visit (INDEPENDENT_AMBULATORY_CARE_PROVIDER_SITE_OTHER): Payer: Medicare Other | Admitting: Neurology

## 2017-03-17 VITALS — BP 110/60 | HR 68 | Ht 62.0 in | Wt 120.0 lb

## 2017-03-17 DIAGNOSIS — R55 Syncope and collapse: Secondary | ICD-10-CM | POA: Diagnosis not present

## 2017-03-17 NOTE — Progress Notes (Signed)
NEUROLOGY CONSULTATION NOTE  Whitney Marks MRN: [id number] DOB: [birth year]  Referring provider: Suella Broad, MD Primary care provider: [facility]  Reason for consult:  falls  HISTORY OF PRESENT ILLNESS: Whitney Marks is a 56 year old right-handed female with chronic pain and history of acute on chronic respiratory failure with hypoxia who presents for falls.  She is accompanied by her daughter who supplements history.  She has a history of stenosis of the larynx, which contributed to recurrent episodes of hypoxia and altered mental status.  She also had recurrent falls as well.  She had several head CTs to evaluate these events.  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  This is stable compared to prior head CT from 10/30/11.  She subsequently required a tracheostomy.  For about 10 years, she has had spontaneous falls.  Her knees just give out.  There is no associated dizziness, lightheadedness, near-syncope, loss of consciousness or awareness or visual disturbance.  She has has chronic neck and back pain, but no acute flare-ups that precipitate it.  She has knee pain as well.  It is not positional.  It occurs when standing or walking.  She falls about 6 times a day.  Initially, it was attributed to her hypoxic episodes and they seemed to improve after placement of the tracheostomy.  However, they started to recur and become more frequent over the past 2-3 years.  She otherwise feels unsteady on her feet.  She sleeps well and denies excessive daytime somnolence.  She has history of chronic neck and back pain with long-term use of opiates.  She has cervical spondylosis at C3-4 and C4-5 with mild spinal stenosis at C4-5, as well as multilevel lumbar degenerative disc disease.  She is treated for her pain by Dr. Nelva Bush.   She has not been responding well to pain therapy.  She takes Valium 3  times daily and Percocet 4 times daily.  PAST MEDICAL HISTORY: Past Medical History:  Diagnosis Date  . AKI (acute kidney injury) ([place])   . Anxiety   . Arthritis   . 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[place])   . Chronic pain   . Depression   . Difficult intubation   . Fibromyalgia   . Headache(784.0)   . Neuromuscular disorder ([place])   . Pneumonia   . Recurrent upper respiratory infection (URI)   . Shortness of breath   . Sleep apnea     PAST SURGICAL HISTORY: Past Surgical History:  Procedure Laterality Date  . LARYNGOSCOPY N/A 01/24/2014   Procedure: MICRO LARYNGOSCOPY;  Surgeon: Melida Quitter, MD;  Location: [facility];  Service: ENT;  Laterality: N/A;  MICRO DIRECT LARYNGOSCOPY  . NECK SURGERY      MEDICATIONS: Current Outpatient Prescriptions on File Prior to Visit  Medication Sig Dispense Refill  . diazepam (VALIUM) 10 MG tablet Take 5-10 mg by mouth 3 (three) times daily. Take 5 mg by mouth twice daily & 10 mg at bedtime  0  . FLUoxetine (PROZAC) 10 MG capsule Take 2 capsules (20 mg total) by mouth daily. 60 capsule 0  . levofloxacin (LEVAQUIN) 500 MG tablet Take 1 tablet (500 mg total) by mouth daily. 10 tablet 0  . OPANA ER, CRUSH RESISTANT, 10 MG T12A 12 hr tablet Take 10 mg by mouth every 12 (twelve) hours.  0  . oxyCODONE-acetaminophen (PERCOCET) 10-325 MG tablet Take 1 tablet by mouth 3 (  three) times daily as needed for pain.   0  . promethazine (PHENERGAN) 12.5 MG tablet Take 1 tablet (12.5 mg total) by mouth every 6 (six) hours as needed for nausea or vomiting. 5 tablet 0  . traZODone (DESYREL) 100 MG tablet Take 100-200 mg by mouth at bedtime.   0  . VOLTAREN 1 % GEL Apply 4 g topically 4 (four) times daily as needed (pain).   2   No current facility-administered medications on file prior to visit.     ALLERGIES: Allergies  Allergen Reactions  . Lyrica [Pregabalin] Other (See Comments)    Couldn't walk  . Pentazocine Lactate Other (See Comments)    hallucinates  . Ranitidine  Hcl Other (See Comments)    blisters  . Tobramycin Hives  . Zofran [Ondansetron Hcl] Swelling  . Codeine Itching    FAMILY HISTORY: History reviewed. No pertinent family history.  SOCIAL HISTORY: Social History   Social History  . Marital status: Single    Spouse name: N/A  . Number of children: N/A  . Years of education: N/A   Occupational History  . Not on file.   Social History Main Topics  . Smoking status: Never Smoker  . Smokeless tobacco: Never Used  . Alcohol use Yes  . Drug use: No  . Sexual activity: Not on file   Other Topics Concern  . Not on file   Social History Narrative  . No narrative on file    REVIEW OF SYSTEMS: Constitutional: No fevers, chills, or sweats, no generalized fatigue, change in appetite Eyes: No visual changes, double vision, eye pain Ear, nose and throat: No hearing loss, ear pain, nasal congestion, sore throat Cardiovascular: No chest pain, palpitations Respiratory:  Has a tracheostomy.  No shortness of breath at rest or with exertion, wheezes GastrointestinaI: No nausea, vomiting, diarrhea, abdominal pain, fecal incontinence Genitourinary:  No dysuria, urinary retention or frequency Musculoskeletal:  Neck pain, back pain, knee pain Integumentary: No rash, pruritus, skin lesions Neurological: as above Psychiatric: No depression, insomnia, anxiety Endocrine: No palpitations, fatigue, diaphoresis, mood swings, change in appetite, change in weight, increased thirst Hematologic/Lymphatic:  No purpura, petechiae. Allergic/Immunologic: no itchy/runny eyes, nasal congestion, recent allergic reactions, rashes  PHYSICAL EXAM: Vitals:   03/17/17 1118  BP: 110/60  Pulse: 68   General: No acute distress.   Head:  Normocephalic/atraumatic Eyes:  fundi examined but not visualized Neck: supple, suboccipital tenderness, no paraspinal tenderness, full range of motion.  With tracheostomy Back: paraspinal tenderness Heart: regular rate and  rhythm Lungs: Clear to auscultation bilaterally. Vascular: No carotid bruits. Neurological Exam: Mental status: alert and oriented to person, place, and time, recent and remote memory intact, fund of knowledge intact, attention and concentration intact, speech fluent and not dysarthric, language intact. Cranial nerves: CN I: not tested CN II: pupils equal, round and reactive to light, visual fields intact CN III, IV, VI:  full range of motion, no nystagmus, no ptosis CN V: facial sensation intact CN VII: upper and lower face symmetric CN VIII: hearing intact CN IX, X: gag intact, uvula midline CN XI: sternocleidomastoid and trapezius muscles intact CN XII: tongue midline Bulk & Tone: normal, no fasciculations. Motor:  5/5 throughout  Sensation:  Pinprick sensation reduced in feet up to ankles and vibration sensation intact. Deep Tendon Reflexes:  2+ throughout, toes downgoing. Finger to nose testing:  Without dysmetria.  Heel to shin:  Without dysmetria.  Gait:  Cautious with good stride but sometimes with some  sway.  Able to turn, unable to tandem walk. Romberg negative.  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  Her exam is without lateralizing findings.  She has had prior head CTs, which are unremarkable.  She reportedly has had imaging of the cervical spine in the past few years (since these events started), which have not shown many significant stenosis.  Epileptic drop attacks are possible but they are rare and unlikely.  Consider transient episodes of hypoxia (since they reportedly improved after tracheostomy), medication effect, or knee pathology (she reports knee pain)  PLAN: 1.  We will check an EEG 2.  We will request CT or MRI reports of the cervical spine from Dr. Jeralyn Ruths office.   3.  Further recommendations pending review.  Otherwise, recommend looking for alternative causes as discussed above.  Thank you for allowing me to take part in the  care of this patient.  Metta Clines, DO  CC:  Suella Broad, MD  [facility] in [place], [place]

## 2017-03-17 NOTE — Patient Instructions (Signed)
1.  We will get an EEG to record your brain waves 2.  I want to get MRI reports of your cervical spine from Dr. Nelva Bush 3.  Consider evaluation for hypoxia.  Medication side effect due to Valium or Percocet also possible.

## 2017-03-22 ENCOUNTER — Other Ambulatory Visit: Payer: Medicare Other

## 2017-03-22 ENCOUNTER — Encounter: Payer: Self-pay | Admitting: Neurology

## 2017-09-23 ENCOUNTER — Emergency Department (HOSPITAL_COMMUNITY)
Admission: EM | Admit: 2017-09-23 | Discharge: 2017-09-24 | Disposition: A | Discharge status: Home / Self Care | Payer: Medicare Other | Attending: Emergency Medicine | Admitting: Emergency Medicine

## 2017-09-23 ENCOUNTER — Emergency Department (HOSPITAL_COMMUNITY): Payer: Medicare Other

## 2017-09-23 ENCOUNTER — Encounter (HOSPITAL_COMMUNITY): Payer: Self-pay | Admitting: Emergency Medicine

## 2017-09-23 DIAGNOSIS — J0410 Acute tracheitis without obstruction: Secondary | ICD-10-CM | POA: Insufficient documentation

## 2017-09-23 DIAGNOSIS — M549 Dorsalgia, unspecified: Secondary | ICD-10-CM | POA: Insufficient documentation

## 2017-09-23 DIAGNOSIS — Z79899 Other long term (current) drug therapy: Secondary | ICD-10-CM | POA: Insufficient documentation

## 2017-09-23 DIAGNOSIS — R0602 Shortness of breath: Secondary | ICD-10-CM | POA: Diagnosis present

## 2017-09-23 NOTE — ED Notes (Signed)
RT at bedside to suction pt trach

## 2017-09-23 NOTE — ED Triage Notes (Signed)
Per family patient was "out of it" during thanksgiving.  Here today due to inner canula coming out of trach.  Lungs noted to be coarse.  Patient noted to be drowsy in triage.  C/O sob.  Reports taking 2 back pain pills.

## 2017-09-23 NOTE — ED Provider Notes (Addendum)
[facility] EMERGENCY DEPARTMENT Provider Note   CSN: [id number] Arrival date & time: 09/23/17  2324     History   Chief Complaint Chief Complaint  Patient presents with  . Shortness of Breath    HPI Whitney Marks is a 56 y.o. female.  Patient comes to the emergency department for evaluation of shortness of breath and mental status changes.  Significant other reports that over the last 2 or 3 days she has become progressively more sleepy and less active.  Comes in tonight for evaluation of difficulty breathing with cough and increased tracheal secretions.  No fever noted.      Past Medical History:  Diagnosis Date  . AKI (acute kidney injury) ([place])   . Anxiety   . Arthritis   . 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[place])   . Chronic pain   . Depression   . Difficult intubation   . Fibromyalgia   . Headache(784.0)   . Neuromuscular disorder ([place])   . Pneumonia   . Recurrent upper respiratory infection (URI)   . Shortness of breath   . Sleep apnea     Patient Active Problem List   Diagnosis Date Noted  . Tracheostomy present ([place])   . Somnolence   . Acute respiratory failure with hypoxia ([place]) 01/27/2015  . Hypoglycemia 01/27/2015  . Hypoxic 01/27/2015  . Acute on chronic respiratory failure with hypoxia ([place])   . Altered mental status   . AKI (acute kidney injury) ([place])   . Respiratory acidosis   . Tracheostomy dependence ([place])   . Hypotensive episode   . Septic shock(785.52) 01/11/2014  . Acute respiratory failure ([place]) 01/09/2014  . Pneumonia 01/09/2014  . ARDS (adult respiratory distress syndrome) ([place]) 10/30/2011  . CAP (community acquired pneumonia) 10/30/2011  . Aspiration pneumonia ([place]) 10/30/2011  . Encephalopathy acute 10/30/2011  . 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 Severe sepsis(995.92) 10/30/2011    Past Surgical History:  Procedure Laterality Date  . LARYNGOSCOPY N/A  01/24/2014   Procedure: MICRO LARYNGOSCOPY;  Surgeon: Melida Quitter, MD;  Location: [facility];  Service: ENT;  Laterality: N/A;  MICRO DIRECT LARYNGOSCOPY  . NECK SURGERY      OB History    No data available       Home Medications    Prior to Admission medications   Medication Sig Start Date End Date Taking? Authorizing Provider  diazepam (VALIUM) 10 MG tablet Take 5-10 mg by mouth 3 (three) times daily. Take 5 mg by mouth twice daily & 10 mg at bedtime 09/21/15  Yes [provider]  escitalopram (LEXAPRO) 10 MG tablet Take 10 mg by mouth daily.   Yes [provider]  FLUoxetine (PROZAC) 10 MG capsule Take 2 capsules (20 mg total) by mouth daily. 01/29/15  Yes Allie Bossier, MD  oxyCODONE-acetaminophen (PERCOCET) 10-325 MG tablet Take 1 tablet by mouth 3 (three) times daily as needed for pain.  10/06/15  Yes [provider]  pregabalin (LYRICA) 150 MG capsule Take 150 mg by mouth daily as needed.   Yes [provider]  promethazine (PHENERGAN) 12.5 MG tablet Take 1 tablet (12.5 mg total) by mouth every 6 (six) hours as needed for nausea or vomiting. 01/27/16  Yes Little, Wenda Overland, MD  traZODone (DESYREL) 100 MG tablet Take 100-200 mg by mouth at bedtime.  09/23/15  Yes [provider]  VOLTAREN 1 % GEL Apply 4 g topically 4 (  four) times daily as needed (pain).  10/06/15  Yes [provider]  levofloxacin (LEVAQUIN) 750 MG tablet Take 1 tablet (750 mg total) by mouth daily. 09/24/17   Orpah Greek, MD  OPANA ER, CRUSH RESISTANT, 10 MG T12A 12 hr tablet Take 10 mg by mouth every 12 (twelve) hours. 10/06/15   [provider]    Family History No family history on file.  Social History Social History   Tobacco Use  . Smoking status: Never Smoker  . Smokeless tobacco: Never Used  Substance Use Topics  . Alcohol use: Yes  . Drug use: No     Allergies   Lyrica [pregabalin]; Other; Pentazocine lactate; Ranitidine  hcl; Tobramycin; Zofran [ondansetron hcl]; Ranitidine; Azithromycin; and Codeine   Review of Systems Review of Systems  Constitutional: Positive for activity change.  Respiratory: Positive for cough and shortness of breath.   All other systems reviewed and are negative.    Physical Exam Updated Vital Signs BP 104/69   Pulse 62   Temp 98.7 F (37.1 C) (Oral)   Resp 18   Ht 5\' 2"  (1.575 m)   Wt 53.1 kg (117 lb)   SpO2 97%   BMI 21.40 kg/m   Physical Exam  Constitutional: She appears well-developed and well-nourished. No distress.  HENT:  Head: Normocephalic and atraumatic.  Right Ear: Hearing normal.  Left Ear: Hearing normal.  Nose: Nose normal.  Mouth/Throat: Oropharynx is clear and moist and mucous membranes are normal.  Eyes: Conjunctivae and EOM are normal. Pupils are equal, round, and reactive to light.  Neck: Normal range of motion. Neck supple.  Cardiovascular: Regular rhythm, S1 normal and S2 normal. Exam reveals no gallop and no friction rub.  No murmur heard. Pulmonary/Chest: Effort normal. No respiratory distress. She has decreased breath sounds. She exhibits no tenderness.  Significant amount of tracheal secretions with cough, loud upper airway resonance noted  Abdominal: Soft. Normal appearance and bowel sounds are normal. There is no hepatosplenomegaly. There is no tenderness. There is no rebound, no guarding, no tenderness at McBurney's point and negative Murphy's sign. No hernia.  Musculoskeletal: Normal range of motion.  Neurological: She is alert. She has normal strength. No cranial nerve deficit or sensory deficit. Coordination normal. GCS eye subscore is 4. GCS verbal subscore is 5. GCS motor subscore is 6.  Skin: Skin is warm, dry and intact. No rash noted. No cyanosis.  Psychiatric: She has a normal mood and affect. Her speech is normal and behavior is normal. Thought content normal.  Nursing note and vitals reviewed.    ED Treatments / Results    Labs (all labs ordered are listed, but only abnormal results are displayed) Labs Reviewed  CBC - Abnormal; Notable for the following components:      Result Value   WBC 14.1 (*)    Hemoglobin 11.8 (*)    All other components within normal limits  BASIC METABOLIC PANEL - Abnormal; Notable for the following components:   Chloride 115 (*)    CO2 20 (*)    Glucose, Bld 106 (*)    BUN 21 (*)    Creatinine, Ser 1.18 (*)    Calcium 8.8 (*)    GFR calc non Af Amer 51 (*)    GFR calc Af Amer 59 (*)    All other components within normal limits  RAPID URINE DRUG SCREEN, HOSP PERFORMED - Abnormal; Notable for the following components:   Opiates POSITIVE (*)    Benzodiazepines  POSITIVE (*)    All other components within normal limits  URINALYSIS, ROUTINE W REFLEX MICROSCOPIC - Abnormal; Notable for the following components:   Leukocytes, UA LARGE (*)    Squamous Epithelial / LPF 0-5 (*)    All other components within normal limits  I-STAT ARTERIAL BLOOD GAS, ED - Abnormal; Notable for the following components:   pH, Arterial 7.306 (*)    pO2, Arterial 63.0 (*)    Acid-base deficit 5.0 (*)    All other components within normal limits    EKG  EKG Interpretation  Date/Time:  Saturday September 23 2017 23:46:16 EST Ventricular Rate:  82 PR Interval:    QRS Duration: 87 QT Interval:  402 QTC Calculation: 470 R Axis:   65 Text Interpretation:  Sinus rhythm Baseline wander in lead(s) V1 Normal ECG Confirmed by Orpah Greek [phone]) on 09/23/2017 11:49:23 PM       Radiology Dg Chest Portable 1 View  Result Date: 09/24/2017 CLINICAL DATA:  Shortness of breath tonight. EXAM: PORTABLE CHEST 1 VIEW COMPARISON:  Most recent comparison radiograph 07/21/2016 FINDINGS: Patient is rotated to the right. Tracheostomy tube at the thoracic inlet. Lower lung volumes from prior exam. Mild cardiomegaly. Patchy chronic bibasilar changes are stable from prior exams. No pulmonary edema. No  confluent airspace disease or large pleural effusion. No pneumothorax. The previous nodular opacity in the left midlung not visualized on the current exam. IMPRESSION: 1. Mild cardiomegaly. 2. Chronic bibasilar changes. Electronically Signed   By: Jeb Levering M.D.   On: 09/24/2017 00:17    Procedures Procedures (including critical care time)  Medications Ordered in ED Medications - No data to display   Initial Impression / Assessment and Plan / ED Course  I have reviewed the triage vital signs and the nursing notes.  Pertinent labs & imaging results that were available during my care of the patient were reviewed by me and considered in my medical decision making (see chart for details).     Patient brought to the emergency department for evaluation of difficulty breathing.  Significant other reports that she has been acting herself, has been "out of it" for a couple of days.  At arrival she has copious secretions noted from her trach.  After significant amounts of suctioning, this has cleared and her oxygen saturations have been around 94%.  She is on room air.  Chest x-ray does not show pneumonia.  Blood work is normal.  She was very sleepy on arrival, but this has improved.  I suspect this is secondary to her chronic narcotic use, as she is prescribed oxycodone and Valium.  She does not have any evidence of hypercarbia on her blood gas.  Patient felt to be stable for discharge secondary to her initial complaints.  Upon attempting to initiate discharge, however, patient reported that her legs are weak and she cannot walk.  She is able to ambulate but needs assistance here in the ER.  She reports that this started yesterday.  When I examined her, now that she is fully awake and alert, she reports inability to lift her legs against gravity in the bed.  While I was talking to her however, she lifted her left leg easily off the bed and crossed her legs while talking, so I have some questions about  how weak her legs are.  She is, however, complaining of increasing upper and lower back pain over the last 24 hours, therefore will perform MRI to rule out lesion.  If negative, she will need to be discharged with original discharge plan to treat for tracheitis.  Final Clinical Impressions(s) / ED Diagnoses   Final diagnoses:  Tracheitis  Acute bilateral back pain, unspecified back location    ED Discharge Orders        Ordered    levofloxacin (LEVAQUIN) 750 MG tablet  Daily     09/24/17 0306       Orpah Greek, MD 09/24/17 5784    Orpah Greek, MD 09/24/17 [phone]

## 2017-09-24 ENCOUNTER — Emergency Department (HOSPITAL_COMMUNITY): Payer: Medicare Other

## 2017-09-24 DIAGNOSIS — J0410 Acute tracheitis without obstruction: Secondary | ICD-10-CM | POA: Diagnosis not present

## 2017-09-24 LAB — URINALYSIS, ROUTINE W REFLEX MICROSCOPIC
BACTERIA UA: NONE SEEN
BILIRUBIN URINE: NEGATIVE
Glucose, UA: NEGATIVE mg/dL
HGB URINE DIPSTICK: NEGATIVE
Ketones, ur: NEGATIVE mg/dL
Nitrite: NEGATIVE
PROTEIN: NEGATIVE mg/dL
Specific Gravity, Urine: 1.029 (ref 1.005–1.030)
pH: 5 (ref 5.0–8.0)

## 2017-09-24 LAB — I-STAT ARTERIAL BLOOD GAS, ED
ACID-BASE DEFICIT: 5 mmol/L — AB (ref 0.0–2.0)
Bicarbonate: 20.7 mmol/L (ref 20.0–28.0)
O2 SAT: 89 %
PO2 ART: 63 mmHg — AB (ref 83.0–108.0)
Patient temperature: 98.7
TCO2: 22 mmol/L (ref 22–32)
pCO2 arterial: 41.5 mmHg (ref 32.0–48.0)
pH, Arterial: 7.306 — ABNORMAL LOW (ref 7.350–7.450)

## 2017-09-24 LAB — BASIC METABOLIC PANEL
Anion gap: 7 (ref 5–15)
BUN: 21 mg/dL — AB (ref 6–20)
CHLORIDE: 115 mmol/L — AB (ref 101–111)
CO2: 20 mmol/L — ABNORMAL LOW (ref 22–32)
CREATININE: 1.18 mg/dL — AB (ref 0.44–1.00)
Calcium: 8.8 mg/dL — ABNORMAL LOW (ref 8.9–10.3)
GFR calc Af Amer: 59 mL/min — ABNORMAL LOW (ref >60)
GFR calc non Af Amer: 51 mL/min — ABNORMAL LOW (ref >60)
Glucose, Bld: 106 mg/dL — ABNORMAL HIGH (ref 65–99)
Potassium: 3.8 mmol/L (ref 3.5–5.1)
Sodium: 142 mmol/L (ref 135–145)

## 2017-09-24 LAB — CBC
HEMATOCRIT: 37 % (ref 36.0–46.0)
HEMOGLOBIN: 11.8 g/dL — AB (ref 12.0–15.0)
MCH: 30.3 pg (ref 26.0–34.0)
MCHC: 31.9 g/dL (ref 30.0–36.0)
MCV: 95.1 fL (ref 78.0–100.0)
Platelets: 396 10*3/uL (ref 150–400)
RBC: 3.89 MIL/uL (ref 3.87–5.11)
RDW: 15 % (ref 11.5–15.5)
WBC: 14.1 10*3/uL — ABNORMAL HIGH (ref 4.0–10.5)

## 2017-09-24 LAB — RAPID URINE DRUG SCREEN, HOSP PERFORMED
AMPHETAMINES: NOT DETECTED
BARBITURATES: NOT DETECTED
Benzodiazepines: POSITIVE — AB
COCAINE: NOT DETECTED
Opiates: POSITIVE — AB
TETRAHYDROCANNABINOL: NOT DETECTED

## 2017-09-24 MED ORDER — OXYCODONE-ACETAMINOPHEN 5-325 MG PO TABS
1.0000 | ORAL_TABLET | Freq: Once | ORAL | Status: AC
  Administered 2017-09-24: 1 via ORAL
  Filled 2017-09-24: qty 1

## 2017-09-24 MED ORDER — LEVOFLOXACIN 750 MG PO TABS: 750.0000 mg | ORAL_TABLET | Freq: Every day | ORAL | 0 refills | Status: DC

## 2017-09-24 NOTE — ED Notes (Signed)
MD notified of patients attempt to ambulate. Provider at bedside

## 2017-09-24 NOTE — Discharge Instructions (Addendum)
Your MRI, blood work,  x-ray are all reassuring  Please take antibiotics for likely infection related to your trach  Please return for worsening symptoms, including fever, difficulty breathing, or any other symptoms concerning ot you.

## 2017-09-24 NOTE — ED Provider Notes (Signed)
Please see previous physicians note regarding patient's presenting history and physical, initial ED course, and associated medical decision making.  MRI of the lumbar and thoracic spine does not show acute spinal cord compression or other serious lesions.  Patient has been in the ED for several hours, chronically ill-appearing but no acute distress.  Vital signs are stable.  Breathing comfortably.  She is stable for discharge home. Will be treated for bacterial tracheitis. Strict return and follow-up instructions reviewed. She expressed understanding of all discharge instructions and felt comfortable with the plan of care.    Forde Dandy, MD 09/24/17 1001

## 2017-09-24 NOTE — ED Notes (Signed)
Patient was asked to ambulate. Patient was unsteady on her feet and walked as if her legs were giving out. Patient was able to correct herself each time her legs would "give out". MD notified.

## 2017-09-24 NOTE — ED Notes (Signed)
RN got patient up to ambulate. Pt is unsteady on her feet. Her legs "kept giving out." RN asked if she normally walked this way and she stated no. Pt stated that her legs hurt her. MD notified

## 2017-09-24 NOTE — ED Notes (Signed)
Patient transported to MRI 

## 2017-09-24 NOTE — ED Notes (Signed)
Provider at the bedside.  

## 2017-09-24 NOTE — ED Notes (Signed)
Pt waiting for Husband to come get her

## 2018-07-24 ENCOUNTER — Other Ambulatory Visit: Payer: Self-pay

## 2018-07-24 ENCOUNTER — Emergency Department (HOSPITAL_COMMUNITY): Payer: Medicare Other

## 2018-07-24 ENCOUNTER — Emergency Department (HOSPITAL_COMMUNITY)
Admission: EM | Admit: 2018-07-24 | Discharge: 2018-07-24 | Disposition: A | Discharge status: Home / Self Care | Payer: Medicare Other | Attending: Emergency Medicine | Admitting: Emergency Medicine

## 2018-07-24 ENCOUNTER — Encounter (HOSPITAL_COMMUNITY): Payer: Self-pay

## 2018-07-24 DIAGNOSIS — Z79899 Other long term (current) drug therapy: Secondary | ICD-10-CM | POA: Diagnosis not present

## 2018-07-24 DIAGNOSIS — N3000 Acute cystitis without hematuria: Secondary | ICD-10-CM | POA: Insufficient documentation

## 2018-07-24 DIAGNOSIS — R51 Headache: Secondary | ICD-10-CM | POA: Insufficient documentation

## 2018-07-24 DIAGNOSIS — R519 Headache, unspecified: Secondary | ICD-10-CM

## 2018-07-24 DIAGNOSIS — I10 Essential (primary) hypertension: Secondary | ICD-10-CM | POA: Insufficient documentation

## 2018-07-24 LAB — CBC WITH DIFFERENTIAL/PLATELET
ABS IMMATURE GRANULOCYTES: 0 10*3/uL (ref 0.0–0.1)
BASOS ABS: 0.1 10*3/uL (ref 0.0–0.1)
BASOS PCT: 1 %
EOS PCT: 2 %
Eosinophils Absolute: 0.1 10*3/uL (ref 0.0–0.7)
HCT: 42.1 % (ref 36.0–46.0)
Hemoglobin: 13.3 g/dL (ref 12.0–15.0)
Immature Granulocytes: 0 %
Lymphocytes Relative: 40 %
Lymphs Abs: 3 10*3/uL (ref 0.7–4.0)
MCH: 29.6 pg (ref 26.0–34.0)
MCHC: 31.6 g/dL (ref 30.0–36.0)
MCV: 93.6 fL (ref 78.0–100.0)
MONO ABS: 0.4 10*3/uL (ref 0.1–1.0)
MONOS PCT: 6 %
NEUTROS ABS: 3.8 10*3/uL (ref 1.7–7.7)
Neutrophils Relative %: 51 %
Platelets: 394 10*3/uL (ref 150–400)
RBC: 4.5 MIL/uL (ref 3.87–5.11)
RDW: 14.6 % (ref 11.5–15.5)
WBC: 7.4 10*3/uL (ref 4.0–10.5)

## 2018-07-24 LAB — URINALYSIS, ROUTINE W REFLEX MICROSCOPIC
Bilirubin Urine: NEGATIVE
Glucose, UA: NEGATIVE mg/dL
KETONES UR: NEGATIVE mg/dL
Nitrite: NEGATIVE
PH: 5 (ref 5.0–8.0)
Protein, ur: 30 mg/dL — AB
Specific Gravity, Urine: 1.016 (ref 1.005–1.030)

## 2018-07-24 LAB — COMPREHENSIVE METABOLIC PANEL
ALBUMIN: 4.2 g/dL (ref 3.5–5.0)
ALK PHOS: 80 U/L (ref 38–126)
ALT: 8 U/L (ref 0–44)
AST: 11 U/L — AB (ref 15–41)
Anion gap: 9 (ref 5–15)
BUN: 12 mg/dL (ref 6–20)
CALCIUM: 9.6 mg/dL (ref 8.9–10.3)
CO2: 22 mmol/L (ref 22–32)
CREATININE: 1 mg/dL (ref 0.44–1.00)
Chloride: 111 mmol/L (ref 98–111)
GFR calc Af Amer: >60 mL/min (ref >60)
GFR calc non Af Amer: >60 mL/min (ref >60)
GLUCOSE: 100 mg/dL — AB (ref 70–99)
Potassium: 3.9 mmol/L (ref 3.5–5.1)
Sodium: 142 mmol/L (ref 135–145)
Total Bilirubin: 0.5 mg/dL (ref 0.3–1.2)
Total Protein: 8.3 g/dL — ABNORMAL HIGH (ref 6.5–8.1)

## 2018-07-24 MED ORDER — HYDROMORPHONE HCL 1 MG/ML IJ SOLN
1.0000 mg | Freq: Once | INTRAMUSCULAR | Status: AC
  Administered 2018-07-24: 1 mg via INTRAVENOUS
  Filled 2018-07-24: qty 1

## 2018-07-24 MED ORDER — SODIUM CHLORIDE 0.9 % IV BOLUS
1000.0000 mL | Freq: Once | INTRAVENOUS | Status: AC
  Administered 2018-07-24: 1000 mL via INTRAVENOUS

## 2018-07-24 MED ORDER — CEPHALEXIN 500 MG PO CAPS: 500.0000 mg | ORAL_CAPSULE | Freq: Four times a day (QID) | ORAL | 0 refills | Status: DC

## 2018-07-24 MED ORDER — CEPHALEXIN 250 MG PO CAPS
500.0000 mg | ORAL_CAPSULE | Freq: Once | ORAL | Status: AC
  Administered 2018-07-24: 500 mg via ORAL
  Filled 2018-07-24: qty 2

## 2018-07-24 MED ORDER — MORPHINE SULFATE (PF) 4 MG/ML IV SOLN
4.0000 mg | Freq: Once | INTRAVENOUS | Status: AC
  Administered 2018-07-24: 4 mg via INTRAVENOUS
  Filled 2018-07-24: qty 1

## 2018-07-24 MED ORDER — PROMETHAZINE HCL 25 MG/ML IJ SOLN
12.5000 mg | Freq: Once | INTRAMUSCULAR | Status: AC
  Administered 2018-07-24: 12.5 mg via INTRAVENOUS
  Filled 2018-07-24: qty 1

## 2018-07-24 NOTE — ED Triage Notes (Signed)
Per Oval Linsey, ha began 2 hours ago while sitting on couch, took ibuprofen and tried to sleep but no relief. Stroke screen negative. Found to be hypertensive and has no hx of htn. No visual disturbances. CBG 97, 97% on RA, HR 74, BP 180/110.

## 2018-07-24 NOTE — ED Notes (Signed)
Patient Alert and oriented to baseline. Stable and ambulatory to baseline. Patient verbalized understanding of the discharge instructions.  Patient belongings were taken by the patient.   

## 2018-07-24 NOTE — Discharge Instructions (Signed)
You will need to get your blood pressure rechecked by your pcp when you are feeling well.

## 2018-07-24 NOTE — ED Provider Notes (Signed)
[facility] EMERGENCY DEPARTMENT Provider Note   CSN: [id number] Arrival date & time: 07/24/18  1706     History   Chief Complaint Chief Complaint  Patient presents with  . Headache  . Hypertension    HPI Whitney Marks is a 57 y.o. female.  Pt presents to the ED today with headache and elevated bp.  Pt said ha began about 2 hrs pta.  She took ibuprofen without improvement in sx.  The pt said she feels weak all over. No specific location.  No numbness.  The pt also c/o sinus congestion and cough.  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  She denies f/c.     Past Medical History:  Diagnosis Date  . AKI (acute kidney injury) ([place])   . Anxiety   . Arthritis   . 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[place])   . Chronic pain   . Depression   . Difficult intubation   . Fibromyalgia   . Headache(784.0)   . Neuromuscular disorder ([place])   . Pneumonia   . Recurrent upper respiratory infection (URI)   . Shortness of breath   . Sleep apnea     Patient Active Problem List   Diagnosis Date Noted  . Tracheostomy present ([place])   . Somnolence   . Acute respiratory failure with hypoxia ([place]) 01/27/2015  . Hypoglycemia 01/27/2015  . Hypoxic 01/27/2015  . Acute on chronic respiratory failure with hypoxia ([place])   . Altered mental status   . AKI (acute kidney injury) ([place])   . Respiratory acidosis   . Tracheostomy dependence ([place])   . Hypotensive episode   . Septic shock(785.52) 01/11/2014  . Acute respiratory failure ([place]) 01/09/2014  . Pneumonia 01/09/2014  . ARDS (adult respiratory distress syndrome) ([place]) 10/30/2011  . CAP (community acquired pneumonia) 10/30/2011  . Aspiration pneumonia ([place]) 10/30/2011  . Encephalopathy acute 10/30/2011  . 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 Severe sepsis(995.92) 10/30/2011    Past Surgical History:  Procedure Laterality Date  . LARYNGOSCOPY  N/A 01/24/2014   Procedure: MICRO LARYNGOSCOPY;  Surgeon: Melida Quitter, MD;  Location: [facility];  Service: ENT;  Laterality: N/A;  MICRO DIRECT LARYNGOSCOPY  . NECK SURGERY    . TRACHEOSTOMY       OB History   None      Home Medications    Prior to Admission medications   Medication Sig Start Date End Date Taking? Authorizing Provider  cephALEXin (KEFLEX) 500 MG capsule Take 1 capsule (500 mg total) by mouth 4 (four) times daily. 07/24/18   Isla Pence, MD  diazepam (VALIUM) 10 MG tablet Take 5-10 mg by mouth 3 (three) times daily. Take 5 mg by mouth twice daily & 10 mg at bedtime 09/21/15   [provider]  escitalopram (LEXAPRO) 10 MG tablet Take 10 mg by mouth daily.    [provider]  FLUoxetine (PROZAC) 10 MG capsule Take 2 capsules (20 mg total) by mouth daily. 01/29/15   Allie Bossier, MD  levofloxacin (LEVAQUIN) 750 MG tablet Take 1 tablet (750 mg total) by mouth daily. 09/24/17   Orpah Greek, MD  OPANA ER, CRUSH RESISTANT, 10 MG T12A 12 hr tablet Take 10 mg by mouth every 12 (twelve) hours. 10/06/15   [provider]  oxyCODONE-acetaminophen (PERCOCET) 10-325 MG tablet Take 1 tablet by mouth 3 (three) times daily as needed for pain.  10/06/15   [provider]  pregabalin (LYRICA) 150 MG capsule Take 150 mg by mouth daily as needed.    [provider]  promethazine (PHENERGAN) 12.5 MG tablet Take 1 tablet (12.5 mg total) by mouth every 6 (six) hours as needed for nausea or vomiting. 01/27/16   Little, Wenda Overland, MD  traZODone (DESYREL) 100 MG tablet Take 100-200 mg by mouth at bedtime.  09/23/15   [provider]  VOLTAREN 1 % GEL Apply 4 g topically 4 (four) times daily as needed (pain).  10/06/15   [provider]    Family History History reviewed. No pertinent family history.  Social History Social History   Tobacco Use  . Smoking status: Never Smoker  . Smokeless tobacco: Never Used    Substance Use Topics  . Alcohol use: Yes    Comment: occ  . Drug use: No     Allergies   Lyrica [pregabalin]; Other; Pentazocine lactate; Ranitidine hcl; Tobramycin; Zofran [ondansetron hcl]; Ranitidine; Azithromycin; and Codeine   Review of Systems Review of Systems  Respiratory: Positive for cough.   Neurological: Positive for headaches.  All other systems reviewed and are negative.    Physical Exam Updated Vital Signs BP (!) 175/98   Pulse (!) 57   Temp 98.3 F (36.8 C) (Oral)   Resp 14   Ht 5\' 2"  (1.575 m)   Wt 49.9 kg   SpO2 99%   BMI 20.12 kg/m   Physical Exam  Constitutional: She is oriented to person, place, and time. She appears well-developed and well-nourished.  HENT:  Head: Normocephalic and atraumatic.  Mouth/Throat: Oropharynx is clear and moist.  Eyes: Pupils are equal, round, and reactive to light. EOM are normal.  Neck: Normal range of motion. Neck supple.    Trach in place  Cardiovascular: Normal rate, regular rhythm, normal heart sounds and intact distal pulses.  Pulmonary/Chest: Effort normal and breath sounds normal.  Abdominal: Soft. Bowel sounds are normal.  Musculoskeletal: Normal range of motion.  Neurological: She is alert and oriented to person, place, and time. She has normal strength.  Skin: Skin is warm and dry. Capillary refill takes less than 2 seconds.  Psychiatric: She has a normal mood and affect. Her behavior is normal.  Nursing note and vitals reviewed.    ED Treatments / Results  Labs (all labs ordered are listed, but only abnormal results are displayed) Labs Reviewed  COMPREHENSIVE METABOLIC PANEL - Abnormal; Notable for the following components:      Result Value   Glucose, Bld 100 (*)    Total Protein 8.3 (*)    AST 11 (*)    All other components within normal limits  URINALYSIS, ROUTINE W REFLEX MICROSCOPIC - Abnormal; Notable for the following components:   APPearance CLOUDY (*)    Hgb urine dipstick SMALL  (*)    Protein, ur 30 (*)    Leukocytes, UA LARGE (*)    Bacteria, UA MANY (*)    All other components within normal limits  CBC WITH DIFFERENTIAL/PLATELET    EKG EKG Interpretation  Date/Time:  Tuesday July 24 2018 19:35:15 EDT Ventricular Rate:  63 PR Interval:    QRS Duration: 88 QT Interval:  435 QTC Calculation: 446 R Axis:   90 Text Interpretation:  Sinus rhythm Borderline right axis deviation No significant change since last tracing Confirmed by Isla Pence [phone]) on 07/24/2018 7:44:38 PM   Radiology Dg Chest 2 View  Result Date: 07/24/2018 CLINICAL DATA:  Pain radiating to left arm EXAM: CHEST - 2 VIEW COMPARISON:  Chest radiograph 09/23/2017 FINDINGS: The lungs are hyperinflated with diffuse interstitial prominence. No focal airspace consolidation or pulmonary edema. No pleural effusion or pneumothorax. Normal cardiomediastinal contours. Tracheostomy tube tip is at the level of the clavicular heads. IMPRESSION: Hyperinflated lungs without focal airspace disease. Electronically Signed   By: Ulyses Jarred M.D.   On: 07/24/2018 18:58   Ct Head Wo Contrast  Result Date: 07/24/2018 CLINICAL DATA:  Headache. EXAM: CT HEAD WITHOUT CONTRAST TECHNIQUE: Contiguous axial images were obtained from the base of the skull through the vertex without intravenous contrast. COMPARISON:  CT scan of May 25, 2016. 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 No mass effect or midline shift is noted. Ventricular size is within normal limits. There is no evidence of mass lesion, hemorrhage or acute infarction. Vascular: No hyperdense vessel or unexpected calcification. Skull: Normal. Negative for fracture or focal lesion. Sinuses/Orbits: No acute finding. Other: None. 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 No acute intracranial abnormality seen. Electronically Signed   By: Marijo Conception, M.D.   On: 07/24/2018 19:08     Procedures Procedures (including critical care time)  Medications Ordered in ED Medications  HYDROmorphone (DILAUDID) injection 1 mg (has no administration in time range)  cephALEXin (KEFLEX) capsule 500 mg (has no administration in time range)  sodium chloride 0.9 % bolus 1,000 mL (0 mLs Intravenous Stopped 07/24/18 1931)  morphine 4 MG/ML injection 4 mg (4 mg Intravenous Given 07/24/18 1752)  promethazine (PHENERGAN) injection 12.5 mg (12.5 mg Intravenous Given 07/24/18 1802)  HYDROmorphone (DILAUDID) injection 1 mg (1 mg Intravenous Given 07/24/18 1934)     Initial Impression / Assessment and Plan / ED Course  I have reviewed the triage vital signs and the nursing notes.  Pertinent labs & imaging results that were available during my care of the patient were reviewed by me and considered in my medical decision making (see chart for details).    Pt is feeling much better.  She does have a UTI, so will be treated with keflex.  Her bp is still a little high, so she is instructed to f/u with pcp regarding bp meds.  She has an appt on Friday, the 27th.  Return if worse.  Final Clinical Impressions(s) / ED Diagnoses   Final diagnoses:  Acute nonintractable headache, unspecified headache type  Hypertension, unspecified type  Acute cystitis without hematuria    ED Discharge Orders         Ordered    cephALEXin (KEFLEX) 500 MG capsule  4 times daily     07/24/18 2130           Isla Pence, MD 07/24/18 2131

## 2018-07-25 ENCOUNTER — Other Ambulatory Visit: Payer: Self-pay

## 2018-07-25 ENCOUNTER — Emergency Department (HOSPITAL_COMMUNITY)
Admission: EM | Admit: 2018-07-25 | Discharge: 2018-07-26 | Disposition: A | Discharge status: Home / Self Care | Payer: Medicare Other | Attending: Emergency Medicine | Admitting: Emergency Medicine

## 2018-07-25 ENCOUNTER — Encounter (HOSPITAL_COMMUNITY): Payer: Self-pay | Admitting: *Deleted

## 2018-07-25 DIAGNOSIS — Z79899 Other long term (current) drug therapy: Secondary | ICD-10-CM | POA: Diagnosis not present

## 2018-07-25 DIAGNOSIS — R51 Headache: Secondary | ICD-10-CM | POA: Insufficient documentation

## 2018-07-25 DIAGNOSIS — R519 Headache, unspecified: Secondary | ICD-10-CM

## 2018-07-25 LAB — DIFFERENTIAL
ABS IMMATURE GRANULOCYTES: 0 10*3/uL (ref 0.0–0.1)
BASOS ABS: 0 10*3/uL (ref 0.0–0.1)
Basophils Relative: 1 %
EOS PCT: 1 %
Eosinophils Absolute: 0 10*3/uL (ref 0.0–0.7)
IMMATURE GRANULOCYTES: 0 %
LYMPHS ABS: 2.7 10*3/uL (ref 0.7–4.0)
LYMPHS PCT: 35 %
Monocytes Absolute: 0.5 10*3/uL (ref 0.1–1.0)
Monocytes Relative: 6 %
NEUTROS ABS: 4.4 10*3/uL (ref 1.7–7.7)
Neutrophils Relative %: 57 %

## 2018-07-25 LAB — CBC
HEMATOCRIT: 40.3 % (ref 36.0–46.0)
HEMOGLOBIN: 12.9 g/dL (ref 12.0–15.0)
MCH: 29.8 pg (ref 26.0–34.0)
MCHC: 32 g/dL (ref 30.0–36.0)
MCV: 93.1 fL (ref 78.0–100.0)
Platelets: 353 10*3/uL (ref 150–400)
RBC: 4.33 MIL/uL (ref 3.87–5.11)
RDW: 14.5 % (ref 11.5–15.5)
WBC: 7.7 10*3/uL (ref 4.0–10.5)

## 2018-07-25 LAB — I-STAT TROPONIN, ED: Troponin i, poc: 0.01 ng/mL (ref 0.00–0.08)

## 2018-07-25 MED ORDER — DIPHENHYDRAMINE HCL 50 MG/ML IJ SOLN
25.0000 mg | Freq: Once | INTRAMUSCULAR | Status: AC
  Administered 2018-07-25: 25 mg via INTRAVENOUS
  Filled 2018-07-25: qty 1

## 2018-07-25 MED ORDER — SODIUM CHLORIDE 0.9 % IV BOLUS
1000.0000 mL | Freq: Once | INTRAVENOUS | Status: AC
  Administered 2018-07-25: 1000 mL via INTRAVENOUS

## 2018-07-25 MED ORDER — METOCLOPRAMIDE HCL 5 MG/ML IJ SOLN
10.0000 mg | Freq: Once | INTRAMUSCULAR | Status: AC
  Administered 2018-07-25: 10 mg via INTRAVENOUS
  Filled 2018-07-25: qty 2

## 2018-07-25 NOTE — ED Provider Notes (Signed)
[facility] EMERGENCY DEPARTMENT Provider Note   CSN: [id number] Arrival date & time: 07/25/18  2045     History   Chief Complaint Chief Complaint  Patient presents with  . Migraine    HPI Whitney Marks is a 57 y.o. female.  The history is provided by the patient.  She has history of cancer of the cervix, chronic pain, sleep apnea, tracheostomy and comes in with recurrence of headache and chest discomfort and numbness of the left arm.  She had been in the emergency department yesterday after having a he has severe right hemicranial headache with associated numbness in the left arm and a heaviness in her chest.  She was noted to have unequal pupils, and had a CT of her head which was unremarkable and was given multiple doses of medications in the ED.  Headache had gone away and numbness had resolved when she left the ED.  At 10:30 AM, headache came back.  Today, the headache is more global.  She also has numbness in the left side of her neck and in her left arm and has had recurrence of the discomfort in her chest.  There is associated nausea but no vomiting.  Headache is described as a pounding sensation and is worse with exposure to light.  Nothing makes it any better.  She has not had anything at home to treat it.  She denies any numbness in her arm or face and has not noticed any weakness.  She was told yesterday that her headache was probably a migraine.  She states she did have migraines when she was a teenager but had not had any as an adult prior to yesterday.  Past Medical History:  Diagnosis Date  . AKI (acute kidney injury) ([place])   . Anxiety   . Arthritis   . 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[place])   . Chronic pain   . Depression   . Difficult intubation   . Fibromyalgia   . Headache(784.0)   . Neuromuscular disorder ([place])   . Pneumonia   . Recurrent upper respiratory infection (URI)   . Shortness of breath   . Sleep apnea     Patient Active Problem List   Diagnosis Date Noted  . Tracheostomy present ([place])   . Somnolence   . Acute respiratory failure with hypoxia ([place]) 01/27/2015  . Hypoglycemia 01/27/2015  . Hypoxic 01/27/2015  . Acute on chronic respiratory failure with hypoxia ([place])   . Altered mental status   . AKI (acute kidney injury) ([place])   . Respiratory acidosis   . Tracheostomy dependence ([place])   . Hypotensive episode   . Septic shock(785.52) 01/11/2014  . Acute respiratory failure ([place]) 01/09/2014  . Pneumonia 01/09/2014  . ARDS (adult respiratory distress syndrome) ([place]) 10/30/2011  . CAP (community acquired pneumonia) 10/30/2011  . Aspiration pneumonia ([place]) 10/30/2011  . Encephalopathy acute 10/30/2011  . 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 Severe sepsis(995.92) 10/30/2011    Past Surgical History:  Procedure Laterality Date  . LARYNGOSCOPY N/A 01/24/2014   Procedure: MICRO LARYNGOSCOPY;  Surgeon: Melida Quitter, MD;  Location: [facility];  Service: ENT;  Laterality: N/A;  MICRO DIRECT LARYNGOSCOPY  . NECK SURGERY    . TRACHEOSTOMY       OB History   None      Home Medications    Prior to Admission medications   Medication Sig Start Date End Date Taking? Authorizing  Provider  cephALEXin (KEFLEX) 500 MG capsule Take 1 capsule (500 mg total) by mouth 4 (four) times daily. 07/24/18   Isla Pence, MD  diazepam (VALIUM) 10 MG tablet Take 5-10 mg by mouth 3 (three) times daily. Take 5 mg by mouth twice daily & 10 mg at bedtime 09/21/15   [provider]  escitalopram (LEXAPRO) 10 MG tablet Take 10 mg by mouth daily.    [provider]  FLUoxetine (PROZAC) 10 MG capsule Take 2 capsules (20 mg total) by mouth daily. 01/29/15   Allie Bossier, MD  levofloxacin (LEVAQUIN) 750 MG tablet Take 1 tablet (750 mg total) by mouth daily. 09/24/17   Orpah Greek, MD  OPANA ER, CRUSH RESISTANT, 10 MG T12A 12 hr tablet Take 10 mg by mouth  every 12 (twelve) hours. 10/06/15   [provider]  oxyCODONE-acetaminophen (PERCOCET) 10-325 MG tablet Take 1 tablet by mouth 3 (three) times daily as needed for pain.  10/06/15   [provider]  pregabalin (LYRICA) 150 MG capsule Take 150 mg by mouth daily as needed.    [provider]  promethazine (PHENERGAN) 12.5 MG tablet Take 1 tablet (12.5 mg total) by mouth every 6 (six) hours as needed for nausea or vomiting. 01/27/16   Little, Wenda Overland, MD  traZODone (DESYREL) 100 MG tablet Take 100-200 mg by mouth at bedtime.  09/23/15   [provider]  VOLTAREN 1 % GEL Apply 4 g topically 4 (four) times daily as needed (pain).  10/06/15   [provider]    Family History No family history on file.  Social History Social History   Tobacco Use  . Smoking status: Never Smoker  . Smokeless tobacco: Never Used  Substance Use Topics  . Alcohol use: Yes    Comment: occ  . Drug use: No     Allergies   Lyrica [pregabalin]; Other; Pentazocine lactate; Ranitidine hcl; Tobramycin; Zofran [ondansetron hcl]; Ranitidine; Azithromycin; and Codeine   Review of Systems Review of Systems  All other systems reviewed and are negative.    Physical Exam Updated Vital Signs BP (!) 179/142 (BP Location: Left Arm)   Pulse 82   Temp 99.3 F (37.4 C)   Resp 16   SpO2 96%   Physical Exam  Nursing note and vitals reviewed.  57 year old female, resting comfortably and in no acute distress. Vital signs are significant for elevated blood pressure. Oxygen saturation is 96%, which is normal. Head is normocephalic and atraumatic.  Pupils are unequal with left pupil 8 mm, right pupil 6 mm, fundi show no hemorrhage or exudate or papilledema; EOMI. Oropharynx is clear. Neck is nontender and supple without adenopathy or JVD.  No carotid bruits are heard.  Tracheostomy in place. Back is nontender and there is no CVA tenderness. Lungs are clear without rales,  wheezes, or rhonchi. Chest is nontender. Heart has regular rate and rhythm without murmur. Abdomen is soft, flat, nontender without masses or hepatosplenomegaly and peristalsis is normoactive. Extremities have no cyanosis or edema, full range of motion is present. Skin is warm and dry without rash. Neurologic: Mental status is normal.  There is no facial droop.  Tongue protrudes in the midline.  Slight decreased sensation noted left side of the face compared with the right.  Slight decreased sensation noted in the left arm and left leg compared with the right arm leg.  There is extinction on double simultaneous stimulation of the arm.  There is no  pronator drift, but there is slight weakness of the left arm and leg compared with the right with strength 4+/5 in left arm and left leg, strength 5/5 and right arm and leg.  ED Treatments / Results  Labs (all labs ordered are listed, but only abnormal results are displayed) Labs Reviewed  COMPREHENSIVE METABOLIC PANEL - Abnormal; Notable for the following components:      Result Value   Chloride 114 (*)    CO2 16 (*)    Glucose, Bld 113 (*)    AST 12 (*)    All other components within normal limits  RAPID URINE DRUG SCREEN, HOSP PERFORMED - Abnormal; Notable for the following components:   Opiates POSITIVE (*)    Benzodiazepines POSITIVE (*)    All other components within normal limits  URINALYSIS, ROUTINE W REFLEX MICROSCOPIC - Abnormal; Notable for the following components:   APPearance HAZY (*)    Protein, ur 100 (*)    Bacteria, UA MANY (*)    All other components within normal limits  ETHANOL  PROTIME-INR  APTT  CBC  DIFFERENTIAL  I-STAT TROPONIN, ED    EKG EKG Interpretation  Date/Time:  Thursday July 26 2018 00:01:11 EDT Ventricular Rate:  64 PR Interval:    QRS Duration: 89 QT Interval:  443 QTC Calculation: 458 R Axis:   65 Text Interpretation:  Sinus rhythm Normal ECG When compared with ECG of 07/24/2018, No  significant change was found Confirmed by Delora Fuel ([id number]) on 07/26/2018 12:10:45 AM   Radiology Dg Chest 2 View  Result Date: 07/24/2018 CLINICAL DATA:  Pain radiating to left arm EXAM: CHEST - 2 VIEW COMPARISON:  Chest radiograph 09/23/2017 FINDINGS: The lungs are hyperinflated with diffuse interstitial prominence. No focal airspace consolidation or pulmonary edema. No pleural effusion or pneumothorax. Normal cardiomediastinal contours. Tracheostomy tube tip is at the level of the clavicular heads. IMPRESSION: Hyperinflated lungs without focal airspace disease. Electronically Signed   By: Ulyses Jarred M.D.   On: 07/24/2018 18:58   Ct Head Wo Contrast  Result Date: 07/24/2018 CLINICAL DATA:  Headache. EXAM: CT HEAD WITHOUT CONTRAST TECHNIQUE: Contiguous axial images were obtained from the base of the skull through the vertex without intravenous contrast. COMPARISON:  CT scan of May 25, 2016. 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 No mass effect or midline shift is noted. Ventricular size is within normal limits. There is no evidence of mass lesion, hemorrhage or acute infarction. Vascular: No hyperdense vessel or unexpected calcification. Skull: Normal. Negative for fracture or focal lesion. Sinuses/Orbits: No acute finding. Other: None. 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 No acute intracranial abnormality seen. Electronically Signed   By: Marijo Conception, M.D.   On: 07/24/2018 19:08    Procedures Procedures   Medications Ordered in ED Medications  sodium chloride 0.9 % bolus 1,000 mL (has no administration in time range)  metoCLOPramide (REGLAN) injection 10 mg (has no administration in time range)  diphenhydrAMINE (BENADRYL) injection 25 mg (has no administration in time range)     Initial Impression / Assessment and Plan / ED Course  I have reviewed the triage vital signs and the nursing notes.  Pertinent  labs & imaging results that were available during my care of the patient were reviewed by me and considered in my medical decision making (see chart for details).  Recurrent headache with the numbness and weakness worrisome for possible stroke, consider complicated migraine.  Old records are  reviewed confirming ED visit yesterday with negative CT of head.  With negative head CT yesterday, I do not feel that she needs repeat CT scan.  However, with neurologic deficits, will get MRI.  We will also give migraine cocktail of normal saline, metoclopramide, diphenhydramine.  Yesterday, she required several injections of hydromorphone to get relief.  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  2:44 AM She had no relief with metoclopramide and is given a dose of hydromorphone.  This did relieve her headache, but it recurred following going through MRI scanner.  She is given a second dose of hydromorphone.  MRI showed no evidence of stroke.  Numbness is almost completely resolved.  At this point, she seems to have a complex migraine.  She is given a dose of dexamethasone to try to prevent rebound headache.  6:15 AM She showed additional improvement but then complained of worsening nausea and was given promethazine.  Nausea improved and she complained that the headache was coming back she is given a third dose of hydromorphone.  Following this, she feels she is improved and back to baseline.  Labs do show normal anion gap metabolic acidosis.  Cause for this is unclear.  Urinalysis has significant bacteria but no significant pyuria and is a contaminated specimen.  Patient states she never got prescription for cephalexin filled and advised not to fill it as this seems to be bacterial from a dirty urine collection.  She is discharged with instructions to follow-up with PCP.  Return precautions discussed.  Final Clinical  Impressions(s) / ED Diagnoses   Final diagnoses:  Bad headache    ED Discharge Orders    None       Delora Fuel, MD [birth year] [phone]

## 2018-07-25 NOTE — ED Notes (Signed)
Due to patient's increasing BP with symptoms, will upgrade acuity and prioritize rooming

## 2018-07-25 NOTE — ED Triage Notes (Signed)
Pt c/o migraine with numbness to L palm and L upper arm that has been going since being discharged from the hospital last night. Last took tylenol at 1730 with no relief

## 2018-07-26 ENCOUNTER — Emergency Department (HOSPITAL_COMMUNITY): Payer: Medicare Other

## 2018-07-26 DIAGNOSIS — R51 Headache: Secondary | ICD-10-CM | POA: Diagnosis not present

## 2018-07-26 LAB — URINALYSIS, ROUTINE W REFLEX MICROSCOPIC
Bilirubin Urine: NEGATIVE
GLUCOSE, UA: NEGATIVE mg/dL
HGB URINE DIPSTICK: NEGATIVE
Ketones, ur: NEGATIVE mg/dL
Leukocytes, UA: NEGATIVE
NITRITE: NEGATIVE
PH: 5 (ref 5.0–8.0)
Protein, ur: 100 mg/dL — AB
SPECIFIC GRAVITY, URINE: 1.029 (ref 1.005–1.030)

## 2018-07-26 LAB — COMPREHENSIVE METABOLIC PANEL
ALBUMIN: 4.1 g/dL (ref 3.5–5.0)
ALK PHOS: 74 U/L (ref 38–126)
ALT: 7 U/L (ref 0–44)
AST: 12 U/L — AB (ref 15–41)
Anion gap: 10 (ref 5–15)
BUN: 14 mg/dL (ref 6–20)
CO2: 16 mmol/L — AB (ref 22–32)
Calcium: 9.2 mg/dL (ref 8.9–10.3)
Chloride: 114 mmol/L — ABNORMAL HIGH (ref 98–111)
Creatinine, Ser: 0.89 mg/dL (ref 0.44–1.00)
GFR calc Af Amer: >60 mL/min (ref >60)
GFR calc non Af Amer: >60 mL/min (ref >60)
GLUCOSE: 113 mg/dL — AB (ref 70–99)
POTASSIUM: 3.5 mmol/L (ref 3.5–5.1)
SODIUM: 140 mmol/L (ref 135–145)
TOTAL PROTEIN: 7.8 g/dL (ref 6.5–8.1)
Total Bilirubin: 0.6 mg/dL (ref 0.3–1.2)

## 2018-07-26 LAB — RAPID URINE DRUG SCREEN, HOSP PERFORMED
Amphetamines: NOT DETECTED
Barbiturates: NOT DETECTED
Benzodiazepines: POSITIVE — AB
Cocaine: NOT DETECTED
Opiates: POSITIVE — AB
Tetrahydrocannabinol: NOT DETECTED

## 2018-07-26 LAB — APTT: aPTT: 30 seconds (ref 24–36)

## 2018-07-26 LAB — PROTIME-INR
INR: 1.06
Prothrombin Time: 13.7 seconds (ref 11.4–15.2)

## 2018-07-26 LAB — ETHANOL: Alcohol, Ethyl (B): <10 mg/dL (ref <10)

## 2018-07-26 MED ORDER — HYDROMORPHONE HCL 1 MG/ML IJ SOLN
INTRAMUSCULAR | Status: AC
  Filled 2018-07-26: qty 1

## 2018-07-26 MED ORDER — HYDROMORPHONE HCL 1 MG/ML IJ SOLN
1.0000 mg | Freq: Once | INTRAMUSCULAR | Status: AC
  Administered 2018-07-26: 1 mg via INTRAVENOUS

## 2018-07-26 MED ORDER — HYDROMORPHONE HCL 1 MG/ML IJ SOLN
1.0000 mg | Freq: Once | INTRAMUSCULAR | Status: AC
  Administered 2018-07-26: 1 mg via INTRAVENOUS
  Filled 2018-07-26: qty 1

## 2018-07-26 MED ORDER — DEXAMETHASONE SODIUM PHOSPHATE 10 MG/ML IJ SOLN
10.0000 mg | Freq: Once | INTRAMUSCULAR | Status: AC
  Administered 2018-07-26: 10 mg via INTRAVENOUS
  Filled 2018-07-26: qty 1

## 2018-07-26 MED ORDER — PROMETHAZINE HCL 25 MG/ML IJ SOLN
12.5000 mg | Freq: Once | INTRAMUSCULAR | Status: AC
  Administered 2018-07-26: 12.5 mg via INTRAVENOUS
  Filled 2018-07-26: qty 1

## 2018-07-26 NOTE — ED Notes (Signed)
Pt called out x 3, continues to c/o pain. Dr. Roxanne Mins aware. No new orders at this time.

## 2018-07-26 NOTE — ED Notes (Signed)
Dr. Glick at bedside.  

## 2018-10-28 ENCOUNTER — Emergency Department (HOSPITAL_COMMUNITY): Payer: Medicare Other

## 2018-10-28 ENCOUNTER — Emergency Department (HOSPITAL_COMMUNITY)
Admission: EM | Admit: 2018-10-28 | Discharge: 2018-10-29 | Disposition: A | Discharge status: Home / Self Care | Payer: Medicare Other | Attending: Emergency Medicine | Admitting: Emergency Medicine

## 2018-10-28 ENCOUNTER — Other Ambulatory Visit: Payer: Self-pay

## 2018-10-28 ENCOUNTER — Encounter (HOSPITAL_COMMUNITY): Payer: Self-pay | Admitting: Emergency Medicine

## 2018-10-28 DIAGNOSIS — R51 Headache: Secondary | ICD-10-CM | POA: Insufficient documentation

## 2018-10-28 DIAGNOSIS — M542 Cervicalgia: Secondary | ICD-10-CM | POA: Diagnosis not present

## 2018-10-28 DIAGNOSIS — R112 Nausea with vomiting, unspecified: Secondary | ICD-10-CM | POA: Insufficient documentation

## 2018-10-28 DIAGNOSIS — R197 Diarrhea, unspecified: Secondary | ICD-10-CM | POA: Insufficient documentation

## 2018-10-28 DIAGNOSIS — W19XXXA Unspecified fall, initial encounter: Secondary | ICD-10-CM

## 2018-10-28 DIAGNOSIS — Z930 Tracheostomy status: Secondary | ICD-10-CM | POA: Insufficient documentation

## 2018-10-28 DIAGNOSIS — Z8541 Personal history of malignant neoplasm of cervix uteri: Secondary | ICD-10-CM | POA: Insufficient documentation

## 2018-10-28 DIAGNOSIS — R05 Cough: Secondary | ICD-10-CM

## 2018-10-28 DIAGNOSIS — R059 Cough, unspecified: Secondary | ICD-10-CM

## 2018-10-28 DIAGNOSIS — R110 Nausea: Secondary | ICD-10-CM

## 2018-10-28 LAB — COMPREHENSIVE METABOLIC PANEL
ALBUMIN: 3.5 g/dL (ref 3.5–5.0)
ALT: 11 U/L (ref 0–44)
ANION GAP: 9 (ref 5–15)
AST: 12 U/L — AB (ref 15–41)
Alkaline Phosphatase: 68 U/L (ref 38–126)
BUN: 7 mg/dL (ref 6–20)
CHLORIDE: 116 mmol/L — AB (ref 98–111)
CO2: 20 mmol/L — ABNORMAL LOW (ref 22–32)
Calcium: 8.4 mg/dL — ABNORMAL LOW (ref 8.9–10.3)
Creatinine, Ser: 0.87 mg/dL (ref 0.44–1.00)
GFR calc Af Amer: >60 mL/min (ref >60)
GFR calc non Af Amer: >60 mL/min (ref >60)
GLUCOSE: 95 mg/dL (ref 70–99)
Potassium: 2.8 mmol/L — ABNORMAL LOW (ref 3.5–5.1)
SODIUM: 145 mmol/L (ref 135–145)
Total Bilirubin: 0.3 mg/dL (ref 0.3–1.2)
Total Protein: 6.3 g/dL — ABNORMAL LOW (ref 6.5–8.1)

## 2018-10-28 LAB — CBC WITH DIFFERENTIAL/PLATELET
Abs Immature Granulocytes: 0.01 10*3/uL (ref 0.00–0.07)
BASOS ABS: 0 10*3/uL (ref 0.0–0.1)
BASOS PCT: 1 %
EOS ABS: 0.1 10*3/uL (ref 0.0–0.5)
Eosinophils Relative: 2 %
HCT: 34.2 % — ABNORMAL LOW (ref 36.0–46.0)
Hemoglobin: 10.9 g/dL — ABNORMAL LOW (ref 12.0–15.0)
IMMATURE GRANULOCYTES: 0 %
Lymphocytes Relative: 52 %
Lymphs Abs: 3.5 10*3/uL (ref 0.7–4.0)
MCH: 29.5 pg (ref 26.0–34.0)
MCHC: 31.9 g/dL (ref 30.0–36.0)
MCV: 92.7 fL (ref 80.0–100.0)
Monocytes Absolute: 0.4 10*3/uL (ref 0.1–1.0)
Monocytes Relative: 6 %
NEUTROS PCT: 39 %
NRBC: 0 % (ref 0.0–0.2)
Neutro Abs: 2.7 10*3/uL (ref 1.7–7.7)
PLATELETS: 279 10*3/uL (ref 150–400)
RBC: 3.69 MIL/uL — AB (ref 3.87–5.11)
RDW: 16.1 % — AB (ref 11.5–15.5)
WBC: 6.8 10*3/uL (ref 4.0–10.5)

## 2018-10-28 LAB — I-STAT CG4 LACTIC ACID, ED: Lactic Acid, Venous: 1 mmol/L (ref 0.5–1.9)

## 2018-10-28 LAB — I-STAT BETA HCG BLOOD, ED (MC, WL, AP ONLY): I-stat hCG, quantitative: 8 m[IU]/mL — ABNORMAL HIGH (ref <5)

## 2018-10-28 LAB — ETHANOL

## 2018-10-28 LAB — SALICYLATE LEVEL: Salicylate Lvl: <7 mg/dL (ref 2.8–30.0)

## 2018-10-28 LAB — ACETAMINOPHEN LEVEL: ACETAMINOPHEN (TYLENOL), SERUM: 12 ug/mL (ref 10–30)

## 2018-10-28 MED ORDER — OXYCODONE-ACETAMINOPHEN 5-325 MG PO TABS
2.00 | ORAL_TABLET | ORAL | Status: DC
Start: ? — End: 2018-10-28

## 2018-10-28 MED ORDER — PROCHLORPERAZINE EDISYLATE 10 MG/2ML IJ SOLN
10.0000 mg | INTRAMUSCULAR | Status: AC
  Administered 2018-10-28: 10 mg via INTRAVENOUS
  Filled 2018-10-28: qty 2

## 2018-10-28 MED ORDER — TRAZODONE HCL 50 MG PO TABS
100.00 | ORAL_TABLET | ORAL | Status: DC
Start: ? — End: 2018-10-28

## 2018-10-28 MED ORDER — SUMATRIPTAN SUCCINATE 50 MG PO TABS
50.00 | ORAL_TABLET | ORAL | Status: DC
Start: ? — End: 2018-10-28

## 2018-10-28 MED ORDER — POTASSIUM CHLORIDE CRYS ER 20 MEQ PO TBCR
40.0000 meq | EXTENDED_RELEASE_TABLET | Freq: Once | ORAL | Status: AC
  Administered 2018-10-28: 40 meq via ORAL
  Filled 2018-10-28: qty 2

## 2018-10-28 MED ORDER — BENZONATATE 100 MG PO CAPS
100.00 | ORAL_CAPSULE | ORAL | Status: DC
Start: ? — End: 2018-10-28

## 2018-10-28 MED ORDER — PROMETHAZINE HCL 25 MG/ML IJ SOLN
12.50 | INTRAMUSCULAR | Status: DC
Start: ? — End: 2018-10-28

## 2018-10-28 MED ORDER — DIAZEPAM 5 MG PO TABS
10.00 | ORAL_TABLET | ORAL | Status: DC
Start: ? — End: 2018-10-28

## 2018-10-28 MED ORDER — IBUPROFEN 600 MG PO TABS
600.00 | ORAL_TABLET | ORAL | Status: DC
Start: ? — End: 2018-10-28

## 2018-10-28 MED ORDER — LACTATED RINGERS IV BOLUS
1000.0000 mL | Freq: Once | INTRAVENOUS | Status: AC
  Administered 2018-10-28: 1000 mL via INTRAVENOUS

## 2018-10-28 MED ORDER — POTASSIUM CHLORIDE IN NACL 20-0.9 MEQ/L-% IV SOLN
100.00 | INTRAVENOUS | Status: DC
Start: ? — End: 2018-10-28

## 2018-10-28 MED ORDER — OSELTAMIVIR PHOSPHATE 75 MG PO CAPS
75.00 | ORAL_CAPSULE | ORAL | Status: DC
Start: 2018-10-27 — End: 2018-10-28

## 2018-10-28 MED ORDER — MENTHOL 9.1 MG MT LOZG
1.00 | LOZENGE | OROMUCOSAL | Status: DC
Start: ? — End: 2018-10-28

## 2018-10-28 NOTE — ED Provider Notes (Signed)
[facility] EMERGENCY DEPARTMENT Provider Note   CSN: [id number] Arrival date & time: 10/28/18  2155     History   Chief Complaint Chief Complaint  Patient presents with  . Pneumonia    HPI Whitney Marks is a 57 y.o. female.  The history is provided by the patient and medical records.     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  Patient states that since then she has had headache, vomiting, diarrhea, abdominal pain, and neck pain.  She denies any chest pain, acute change in her chronic cough, acute shortness of breath, focal extremity weakness, dysuria, blood in her stool, blood in her urine, or other acute complaints.  Patient is unable to specify the exact details of her fall on the 28th other than that she rolled out of bed and hit her head.  She states she did not lose consciousness.  Patient denies any other acute complaints on presentation. Past Medical History:  Diagnosis Date  . AKI (acute kidney injury) ([place])   . Anxiety   . Arthritis   . 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[place])   . Chronic pain   . Depression   . Difficult intubation   . Fibromyalgia   . Headache(784.0)   . Neuromuscular disorder ([place])   . Pneumonia   . Recurrent upper respiratory infection (URI)   . Shortness of breath   . Sleep apnea     Patient Active Problem List   Diagnosis Date Noted  . Tracheostomy present ([place])   . Somnolence   . Acute respiratory failure with hypoxia ([place]) 01/27/2015  . Hypoglycemia 01/27/2015  . Hypoxic 01/27/2015  . Acute on chronic respiratory failure with hypoxia ([place])   . Altered mental status   . AKI (acute kidney injury) ([place])   . Respiratory acidosis   . Tracheostomy dependence ([place])   .  Hypotensive episode   . Septic shock(785.52) 01/11/2014  . Acute respiratory failure ([place]) 01/09/2014  . Pneumonia 01/09/2014  . ARDS (adult respiratory distress syndrome) ([place]) 10/30/2011  . CAP (community acquired pneumonia) 10/30/2011  . Aspiration pneumonia ([place]) 10/30/2011  . Encephalopathy acute 10/30/2011  . 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 Severe sepsis(995.92) 10/30/2011    Past Surgical History:  Procedure Laterality Date  . LARYNGOSCOPY N/A 01/24/2014   Procedure: MICRO LARYNGOSCOPY;  Surgeon: Melida Quitter, MD;  Location: [facility];  Service: ENT;  Laterality: N/A;  MICRO DIRECT LARYNGOSCOPY  . NECK SURGERY    . TRACHEOSTOMY       OB History   No obstetric history on file.      Home Medications    Prior to Admission medications   Medication Sig Start Date End Date Taking? Authorizing Provider  albuterol (PROVENTIL HFA;VENTOLIN HFA) 108 (90 Base) MCG/ACT inhaler Inhale 1-2 puffs into the lungs every 6 (six) hours as needed for wheezing or shortness of breath.   Yes [provider]  butalbital-acetaminophen-caffeine (FIORICET, ESGIC) 50-325-40 MG tablet Take 1 tablet by mouth 2 (two) times daily as needed for headache.   Yes [provider]  diazepam (VALIUM) 10 MG tablet Take 10 mg by mouth every 12 (twelve) hours as needed for anxiety.    Yes [provider]  traZODone (DESYREL) 100 MG tablet Take 100-300 mg by mouth at bedtime.  09/23/15  Yes [provider]  FLUoxetine (PROZAC) 10 MG capsule Take 2 capsules (20 mg total) by mouth daily. Patient not taking: Reported on 07/26/2018 01/29/15   Allie Bossier, MD  promethazine (PHENERGAN) 12.5 MG tablet Take 1 tablet (12.5 mg total) by mouth every 6 (six) hours as needed for nausea or vomiting. Patient not taking: Reported on 10/28/2018 01/27/16   Little, Wenda Overland, MD    Family History No family history on  file.  Social History Social History   Tobacco Use  . Smoking status: Never Smoker  . Smokeless tobacco: Never Used  Substance Use Topics  . Alcohol use: Yes    Comment: occ  . Drug use: No     Allergies   Lyrica [pregabalin]; Other; Pentazocine lactate; Ranitidine hcl; Tobramycin; Zofran [ondansetron hcl]; Ranitidine; Azithromycin; Codeine; and Reglan [metoclopramide]   Review of Systems Review of Systems  Constitutional: Positive for fatigue. Negative for chills and fever.  HENT: Negative for ear pain and sore throat.   Eyes: Negative for pain and visual disturbance.  Respiratory: Positive for cough ( chronic). Negative for shortness of breath.   Cardiovascular: Negative for chest pain and palpitations.  Gastrointestinal: Positive for diarrhea, nausea and vomiting. Negative for abdominal pain.  Genitourinary: Negative for dysuria and hematuria.  Musculoskeletal: Positive for neck pain ( chronic). Negative for arthralgias and back pain.  Skin: Positive for wound ( head and left forearm). Negative for color change and rash.  Neurological: Positive for headaches. Negative for seizures and syncope.  All other systems reviewed and are negative.    Physical Exam Updated Vital Signs BP (!) 160/95 (BP Location: Left Arm)   Pulse 89   Temp 99.1 F (37.3 C) (Rectal)   Resp 17   SpO2 97%   Physical Exam Vitals signs and nursing note reviewed.  Constitutional:      General: She is not in acute distress.    Appearance: She is well-developed and underweight.  HENT:     Head: Normocephalic and atraumatic.     Right Ear: External ear normal.     Left Ear: External ear normal.  Eyes:     Conjunctiva/sclera: Conjunctivae normal.  Neck:     Musculoskeletal: Neck supple.  Cardiovascular:     Rate and Rhythm: Normal rate and regular rhythm.     Heart sounds: No murmur.  Pulmonary:     Effort: Pulmonary effort is normal. No respiratory distress.     Breath sounds: Normal  breath sounds.  Abdominal:     Palpations: Abdomen is soft.     Tenderness: There is no abdominal tenderness.  Musculoskeletal:     Cervical back: She exhibits no bony tenderness.     Thoracic back: She exhibits no bony tenderness.     Lumbar back: She exhibits no bony tenderness.  Skin:    General: Skin is warm and dry.     Capillary Refill: Capillary refill takes less than 2 seconds.     Findings: Ecchymosis ( Patient has scattered bruising over all extremities.) and laceration ( 4 cm superficial hemostatic laceration over the posterior scalp as well as a 8 cm superficial hemostatic laceration over the patient's left forearm that has scab in place.) present.  Neurological:     Mental Status: She is alert.     ED Treatments / Results  Labs (all labs ordered are listed, but only abnormal results are displayed)  Labs Reviewed  CBC WITH DIFFERENTIAL/PLATELET - Abnormal; Notable for the following components:      Result Value   RBC 3.69 (*)    Hemoglobin 10.9 (*)    HCT 34.2 (*)    RDW 16.1 (*)    All other components within normal limits  COMPREHENSIVE METABOLIC PANEL - Abnormal; Notable for the following components:   Potassium 2.8 (*)    Chloride 116 (*)    CO2 20 (*)    Calcium 8.4 (*)    Total Protein 6.3 (*)    AST 12 (*)    All other components within normal limits  URINALYSIS, ROUTINE W REFLEX MICROSCOPIC - Abnormal; Notable for the following components:   Color, Urine STRAW (*)    All other components within normal limits  RAPID URINE DRUG SCREEN, HOSP PERFORMED - Abnormal; Notable for the following components:   Barbiturates POSITIVE (*)    All other components within normal limits  I-STAT BETA HCG BLOOD, ED (MC, WL, AP ONLY) - Abnormal; Notable for the following components:   I-stat hCG, quantitative 8.0 (*)    All other components within normal limits  ACETAMINOPHEN LEVEL  SALICYLATE LEVEL  ETHANOL  MAGNESIUM  I-STAT CG4 LACTIC ACID, ED    EKG EKG  Interpretation  Date/Time:  Sunday October 28 2018 22:06:16 EST Ventricular Rate:  94 PR Interval:    QRS Duration: 89 QT Interval:  390 QTC Calculation: 488 R Axis:   62 Text Interpretation:  Sinus rhythm Minimal ST depression, lateral leads Borderline prolonged QT interval similar to previous Confirmed by Theotis Burrow [phone]) on 10/28/2018 10:31:53 PM   Radiology Dg Pelvis Portable  Result Date: 10/28/2018 CLINICAL DATA:  Patient fell 2 weeks ago out of bed hitting head. EXAM: PORTABLE PELVIS 1-2 VIEWS COMPARISON:  CT 01/27/2016 FINDINGS: There is no evidence of pelvic fracture or diastasis. No pelvic bone lesions are seen. The hip joints are maintained bilaterally. The femoral heads are spherical without flattening. No fracture of proximal femora. Mild L5-S1 facet arthropathy with sclerosis. IMPRESSION: Negative for acute fracture. No joint dislocation. Mild L5-S1 facet arthropathy. Electronically Signed   By: Ashley Royalty M.D.   On: 10/28/2018 22:55   Dg Chest Portable 1 View  Result Date: 10/28/2018 CLINICAL DATA:  Lethargy. Recently diagnosed with pneumonia. Vomiting. EXAM: PORTABLE CHEST 1 VIEW COMPARISON:  07/24/2018 FINDINGS: Tracheostomy tube is noted with tip centered over the upper third of the trachea. Lungs are clear. Heart is top-normal. There is mild uncoiling of the thoracic aorta without aneurysm. No acute osseous abnormality. IMPRESSION: No active disease. Electronically Signed   By: Ashley Royalty M.D.   On: 10/28/2018 22:59    Procedures Procedures (including critical care time)  Medications Ordered in ED Medications  potassium chloride SA (K-DUR,KLOR-CON) CR tablet 40 mEq (40 mEq Oral Given 10/28/18 2351)  lactated ringers bolus 1,000 mL (0 mLs Intravenous Stopped 10/29/18 0132)  prochlorperazine (COMPAZINE) injection 10 mg (10 mg Intravenous Given 10/28/18 2332)     Initial Impression / Assessment and Plan / ED Course  I have reviewed the triage vital signs  and the nursing notes.  Pertinent labs & imaging results that were available during my care of the patient were reviewed by me and considered in my medical decision making (see chart for details).     Patient is a 57 year old female who presents with above-stated history exam.  On presentation patient is afebrile stable vital signs.  Exam as above remarkable for lacerations of  the patient's posterior scalp and forearm as well as scattered ecchymosis over all extremities.  Patient's abdomen is soft and she has no spinous process tenderness.  Her lungs are clear to auscultation bilaterally and she has no focal neurological deficits.  Outside medical records reviewed and notable for admission on 12/28 during which the patient was evaluated for a fall that day.  She received a CT of her head and neck and a chest x-ray that did not show any acute traumatic injuries.  She was admitted for a PT OT and hypokalemia.  However the patient left AMA.  Further imaging of the patient's head and neck were deferred given recent imaging and no interim falls or traumatic injuries according to the patient.  However a chest and pelvis x-rays were obtained that did not show any acute traumatic injuries and the chest x-ray did not show any findings consistent with a pneumothorax, pneumonia, pulmonary edema, pericardial effusion, or other acute findings in the chest.  An EKG was obtained that showed no acute ischemic findings and was consistent with prior tracings.  His CMP was obtained that showed a potassium of 2.8 with otherwise no significant electrolyte or metabolic abnormalities.  Potassium was repleted in the emergency department.  CBC remarkable for WBC 6.8, hemoglobin 10.9, platelets 276.  Lactic acid 1.  Impression is gastroenteritis versus side effects from Tamiflu that was prescribed to the patient on 12/26 for a presumptive diagnosis of influenza.  History exam is not consistent with acute traumatic injury between  presentation and the time of patient being discharged on 12/28.  History exam is not consistent with CVA, sepsis, meningitis, pneumonia, ACS, PE, perforated viscus, or other imminently life-threatening etiology.  Plan of care discussed with oncoming team PA who assumed care at approximately 2359. Plan is to f/u UA and ambulate and likely d/c if unremarkable UA and patient able to ambulate.   Final Clinical Impressions(s) / ED Diagnoses   Final diagnoses:  Cough  Nausea    ED Discharge Orders    None       Hulan Saas, MD 10/29/18 [place], Wenda Overland, MD 11/04/18 1249

## 2018-10-28 NOTE — ED Triage Notes (Signed)
Pt to ED via [facility] with c/o being lethargic.  EMS reports pt was recently dx with pneumonia but st's she took all of her antibiotics

## 2018-10-29 DIAGNOSIS — R05 Cough: Secondary | ICD-10-CM | POA: Diagnosis not present

## 2018-10-29 LAB — URINALYSIS, ROUTINE W REFLEX MICROSCOPIC
BILIRUBIN URINE: NEGATIVE
Glucose, UA: NEGATIVE mg/dL
HGB URINE DIPSTICK: NEGATIVE
Ketones, ur: NEGATIVE mg/dL
Leukocytes, UA: NEGATIVE
NITRITE: NEGATIVE
PH: 6 (ref 5.0–8.0)
Protein, ur: NEGATIVE mg/dL
SPECIFIC GRAVITY, URINE: 1.02 (ref 1.005–1.030)

## 2018-10-29 LAB — RAPID URINE DRUG SCREEN, HOSP PERFORMED
Amphetamines: NOT DETECTED
Barbiturates: POSITIVE — AB
Benzodiazepines: NOT DETECTED
Cocaine: NOT DETECTED
Opiates: NOT DETECTED
Tetrahydrocannabinol: NOT DETECTED

## 2018-10-29 LAB — MAGNESIUM: Magnesium: 1.9 mg/dL (ref 1.7–2.4)

## 2018-10-29 NOTE — ED Notes (Signed)
Pt called out to desk b/c she said her IV was leaking. Once I entered the room the Pt had her IV Pig Tail disconnected from her IV and her IV Catheter was bleeding onto the floor. Tape was taken off the IV Pig Tail and on the floor. Primary RN made aware and stated to go ahead and remove IV from Right Hand.

## 2018-10-29 NOTE — ED Notes (Signed)
Patient verbalizes understanding of discharge instructions. Opportunity for questioning and answers were provided. Armband removed by staff, pt discharged from ED.  

## 2018-10-29 NOTE — Discharge Instructions (Signed)
Return here as needed. Follow up with your doctor. Increase your fluid intake. °

## 2019-05-22 DIAGNOSIS — M545 Low back pain: Secondary | ICD-10-CM | POA: Diagnosis not present

## 2019-05-22 DIAGNOSIS — M5136 Other intervertebral disc degeneration, lumbar region: Secondary | ICD-10-CM | POA: Diagnosis not present

## 2019-05-22 DIAGNOSIS — Z1389 Encounter for screening for other disorder: Secondary | ICD-10-CM | POA: Diagnosis not present

## 2019-05-22 DIAGNOSIS — M542 Cervicalgia: Secondary | ICD-10-CM | POA: Diagnosis not present

## 2019-05-22 DIAGNOSIS — R202 Paresthesia of skin: Secondary | ICD-10-CM | POA: Diagnosis not present

## 2019-05-22 DIAGNOSIS — M5416 Radiculopathy, lumbar region: Secondary | ICD-10-CM | POA: Diagnosis not present

## 2019-05-22 DIAGNOSIS — G894 Chronic pain syndrome: Secondary | ICD-10-CM | POA: Diagnosis not present

## 2019-05-23 DIAGNOSIS — G629 Polyneuropathy, unspecified: Secondary | ICD-10-CM | POA: Diagnosis not present

## 2019-05-23 DIAGNOSIS — Z930 Tracheostomy status: Secondary | ICD-10-CM | POA: Diagnosis not present

## 2019-05-23 DIAGNOSIS — M549 Dorsalgia, unspecified: Secondary | ICD-10-CM | POA: Diagnosis not present

## 2019-05-23 DIAGNOSIS — Z6823 Body mass index (BMI) 23.0-23.9, adult: Secondary | ICD-10-CM | POA: Diagnosis not present

## 2019-05-23 DIAGNOSIS — G4700 Insomnia, unspecified: Secondary | ICD-10-CM | POA: Diagnosis not present

## 2019-05-23 DIAGNOSIS — Z1331 Encounter for screening for depression: Secondary | ICD-10-CM | POA: Diagnosis not present

## 2019-05-29 DIAGNOSIS — M5136 Other intervertebral disc degeneration, lumbar region: Secondary | ICD-10-CM | POA: Diagnosis not present

## 2019-05-29 DIAGNOSIS — M545 Low back pain: Secondary | ICD-10-CM | POA: Diagnosis not present

## 2019-06-10 DIAGNOSIS — R0902 Hypoxemia: Secondary | ICD-10-CM | POA: Diagnosis not present

## 2019-06-10 DIAGNOSIS — R202 Paresthesia of skin: Secondary | ICD-10-CM | POA: Diagnosis not present

## 2019-06-10 DIAGNOSIS — Z79899 Other long term (current) drug therapy: Secondary | ICD-10-CM | POA: Diagnosis not present

## 2019-06-10 DIAGNOSIS — M545 Low back pain: Secondary | ICD-10-CM | POA: Diagnosis not present

## 2019-06-10 DIAGNOSIS — Z79891 Long term (current) use of opiate analgesic: Secondary | ICD-10-CM | POA: Diagnosis not present

## 2019-06-10 DIAGNOSIS — I10 Essential (primary) hypertension: Secondary | ICD-10-CM | POA: Diagnosis not present

## 2019-06-10 DIAGNOSIS — J90 Pleural effusion, not elsewhere classified: Secondary | ICD-10-CM | POA: Diagnosis not present

## 2019-06-10 DIAGNOSIS — R825 Elevated urine levels of drugs, medicaments and biological substances: Secondary | ICD-10-CM | POA: Diagnosis not present

## 2019-06-10 DIAGNOSIS — R079 Chest pain, unspecified: Secondary | ICD-10-CM | POA: Diagnosis not present

## 2019-06-10 DIAGNOSIS — R0789 Other chest pain: Secondary | ICD-10-CM | POA: Diagnosis not present

## 2019-06-10 DIAGNOSIS — G8929 Other chronic pain: Secondary | ICD-10-CM | POA: Diagnosis not present

## 2019-06-11 DIAGNOSIS — M545 Low back pain: Secondary | ICD-10-CM | POA: Diagnosis not present

## 2019-06-11 DIAGNOSIS — G894 Chronic pain syndrome: Secondary | ICD-10-CM | POA: Diagnosis not present

## 2019-06-11 DIAGNOSIS — M5416 Radiculopathy, lumbar region: Secondary | ICD-10-CM | POA: Diagnosis not present

## 2019-06-11 DIAGNOSIS — M5136 Other intervertebral disc degeneration, lumbar region: Secondary | ICD-10-CM | POA: Diagnosis not present

## 2019-06-11 DIAGNOSIS — Z1389 Encounter for screening for other disorder: Secondary | ICD-10-CM | POA: Diagnosis not present

## 2019-06-11 DIAGNOSIS — M542 Cervicalgia: Secondary | ICD-10-CM | POA: Diagnosis not present

## 2019-06-11 DIAGNOSIS — R202 Paresthesia of skin: Secondary | ICD-10-CM | POA: Diagnosis not present

## 2019-06-14 DIAGNOSIS — Z09 Encounter for follow-up examination after completed treatment for conditions other than malignant neoplasm: Secondary | ICD-10-CM | POA: Diagnosis not present

## 2019-06-14 DIAGNOSIS — Z6825 Body mass index (BMI) 25.0-25.9, adult: Secondary | ICD-10-CM | POA: Diagnosis not present

## 2019-06-14 DIAGNOSIS — M549 Dorsalgia, unspecified: Secondary | ICD-10-CM | POA: Diagnosis not present

## 2019-06-16 DIAGNOSIS — I10 Essential (primary) hypertension: Secondary | ICD-10-CM | POA: Diagnosis not present

## 2019-06-16 DIAGNOSIS — Z930 Tracheostomy status: Secondary | ICD-10-CM | POA: Diagnosis not present

## 2019-06-16 DIAGNOSIS — T40601A Poisoning by unspecified narcotics, accidental (unintentional), initial encounter: Secondary | ICD-10-CM | POA: Diagnosis not present

## 2019-06-16 DIAGNOSIS — T402X1A Poisoning by other opioids, accidental (unintentional), initial encounter: Secondary | ICD-10-CM | POA: Diagnosis not present

## 2019-06-16 DIAGNOSIS — R1111 Vomiting without nausea: Secondary | ICD-10-CM | POA: Diagnosis not present

## 2019-06-16 DIAGNOSIS — R404 Transient alteration of awareness: Secondary | ICD-10-CM | POA: Diagnosis not present

## 2019-06-16 DIAGNOSIS — R4182 Altered mental status, unspecified: Secondary | ICD-10-CM | POA: Diagnosis not present

## 2019-06-16 DIAGNOSIS — R0602 Shortness of breath: Secondary | ICD-10-CM | POA: Diagnosis not present

## 2019-06-17 DIAGNOSIS — R4182 Altered mental status, unspecified: Secondary | ICD-10-CM | POA: Diagnosis not present

## 2019-06-21 DIAGNOSIS — M545 Low back pain: Secondary | ICD-10-CM | POA: Diagnosis not present

## 2019-06-21 DIAGNOSIS — M5416 Radiculopathy, lumbar region: Secondary | ICD-10-CM | POA: Diagnosis not present

## 2019-06-21 DIAGNOSIS — G894 Chronic pain syndrome: Secondary | ICD-10-CM | POA: Diagnosis not present

## 2019-06-21 DIAGNOSIS — Z1389 Encounter for screening for other disorder: Secondary | ICD-10-CM | POA: Diagnosis not present

## 2019-06-21 DIAGNOSIS — M5136 Other intervertebral disc degeneration, lumbar region: Secondary | ICD-10-CM | POA: Diagnosis not present

## 2019-06-30 DIAGNOSIS — R441 Visual hallucinations: Secondary | ICD-10-CM | POA: Diagnosis not present

## 2019-06-30 DIAGNOSIS — Z79899 Other long term (current) drug therapy: Secondary | ICD-10-CM | POA: Diagnosis not present

## 2019-06-30 DIAGNOSIS — F23 Brief psychotic disorder: Secondary | ICD-10-CM | POA: Diagnosis not present

## 2019-06-30 DIAGNOSIS — I10 Essential (primary) hypertension: Secondary | ICD-10-CM | POA: Diagnosis not present

## 2019-06-30 DIAGNOSIS — Z79891 Long term (current) use of opiate analgesic: Secondary | ICD-10-CM | POA: Diagnosis not present

## 2019-06-30 DIAGNOSIS — Z751 Person awaiting admission to adequate facility elsewhere: Secondary | ICD-10-CM | POA: Diagnosis not present

## 2019-06-30 DIAGNOSIS — R440 Auditory hallucinations: Secondary | ICD-10-CM | POA: Diagnosis not present

## 2019-06-30 DIAGNOSIS — N179 Acute kidney failure, unspecified: Secondary | ICD-10-CM | POA: Diagnosis not present

## 2019-06-30 DIAGNOSIS — Z430 Encounter for attention to tracheostomy: Secondary | ICD-10-CM | POA: Diagnosis not present

## 2019-07-02 DIAGNOSIS — R05 Cough: Secondary | ICD-10-CM | POA: Diagnosis not present

## 2019-07-02 DIAGNOSIS — R4182 Altered mental status, unspecified: Secondary | ICD-10-CM | POA: Diagnosis not present

## 2019-07-04 ENCOUNTER — Other Ambulatory Visit: Payer: Self-pay

## 2019-07-04 DIAGNOSIS — R42 Dizziness and giddiness: Secondary | ICD-10-CM | POA: Insufficient documentation

## 2019-07-04 DIAGNOSIS — F1920 Other psychoactive substance dependence, uncomplicated: Secondary | ICD-10-CM | POA: Diagnosis not present

## 2019-07-04 DIAGNOSIS — F23 Brief psychotic disorder: Secondary | ICD-10-CM | POA: Diagnosis not present

## 2019-07-04 DIAGNOSIS — M5489 Other dorsalgia: Secondary | ICD-10-CM | POA: Insufficient documentation

## 2019-07-04 DIAGNOSIS — R441 Visual hallucinations: Secondary | ICD-10-CM | POA: Diagnosis not present

## 2019-07-04 DIAGNOSIS — Z8541 Personal history of malignant neoplasm of cervix uteri: Secondary | ICD-10-CM | POA: Insufficient documentation

## 2019-07-04 DIAGNOSIS — F29 Unspecified psychosis not due to a substance or known physiological condition: Secondary | ICD-10-CM | POA: Diagnosis not present

## 2019-07-04 DIAGNOSIS — Z046 Encounter for general psychiatric examination, requested by authority: Secondary | ICD-10-CM | POA: Insufficient documentation

## 2019-07-04 DIAGNOSIS — Z930 Tracheostomy status: Secondary | ICD-10-CM | POA: Insufficient documentation

## 2019-07-04 DIAGNOSIS — Z20828 Contact with and (suspected) exposure to other viral communicable diseases: Secondary | ICD-10-CM | POA: Insufficient documentation

## 2019-07-04 DIAGNOSIS — R440 Auditory hallucinations: Secondary | ICD-10-CM | POA: Diagnosis not present

## 2019-07-04 DIAGNOSIS — R4020 Unspecified coma: Secondary | ICD-10-CM | POA: Diagnosis not present

## 2019-07-04 DIAGNOSIS — Z03818 Encounter for observation for suspected exposure to other biological agents ruled out: Secondary | ICD-10-CM | POA: Diagnosis not present

## 2019-07-04 LAB — BASIC METABOLIC PANEL
Anion gap: 9 (ref 5–15)
BUN: 12 mg/dL (ref 6–20)
CO2: 21 mmol/L — ABNORMAL LOW (ref 22–32)
Calcium: 8.6 mg/dL — ABNORMAL LOW (ref 8.9–10.3)
Chloride: 114 mmol/L — ABNORMAL HIGH (ref 98–111)
Creatinine, Ser: 0.9 mg/dL (ref 0.44–1.00)
GFR calc Af Amer: >60 mL/min (ref >60)
GFR calc non Af Amer: >60 mL/min (ref >60)
Glucose, Bld: 100 mg/dL — ABNORMAL HIGH (ref 70–99)
Potassium: 3.9 mmol/L (ref 3.5–5.1)
Sodium: 144 mmol/L (ref 135–145)

## 2019-07-04 LAB — CBC
HCT: 39.2 % (ref 36.0–46.0)
Hemoglobin: 12.8 g/dL (ref 12.0–15.0)
MCH: 30.8 pg (ref 26.0–34.0)
MCHC: 32.7 g/dL (ref 30.0–36.0)
MCV: 94.5 fL (ref 80.0–100.0)
Platelets: 266 10*3/uL (ref 150–400)
RBC: 4.15 MIL/uL (ref 3.87–5.11)
RDW: 13.8 % (ref 11.5–15.5)
WBC: 6 10*3/uL (ref 4.0–10.5)
nRBC: 0 % (ref 0.0–0.2)

## 2019-07-04 MED ORDER — SODIUM CHLORIDE 0.9% FLUSH: 3.0000 mL | Freq: Once | INTRAVENOUS | Status: DC

## 2019-07-04 NOTE — ED Triage Notes (Signed)
Pt to the er for back pain, dizziness and wants to know what is wrong with her kidneys. Pt is a poor historian. Pt says she was having hallucinations, hearing voices but not now. Pt also reports nausea. Pt was d/c from [facility] today, dx with kidney failure and beh med eval.

## 2019-07-05 ENCOUNTER — Inpatient Hospital Stay: Admission: RE | Admit: 2019-07-05 | Payer: Self-pay | Source: Intra-hospital | Admitting: Psychiatry

## 2019-07-05 ENCOUNTER — Encounter: Payer: Self-pay | Admitting: Intensive Care

## 2019-07-05 ENCOUNTER — Emergency Department: Payer: Medicare PPO

## 2019-07-05 ENCOUNTER — Emergency Department
Admission: EM | Admit: 2019-07-05 | Discharge: 2019-07-09 | Disposition: A | Discharge status: Home / Self Care | Payer: Medicare PPO | Attending: Emergency Medicine | Admitting: Emergency Medicine

## 2019-07-05 DIAGNOSIS — F1920 Other psychoactive substance dependence, uncomplicated: Secondary | ICD-10-CM | POA: Diagnosis not present

## 2019-07-05 DIAGNOSIS — F23 Brief psychotic disorder: Secondary | ICD-10-CM | POA: Diagnosis not present

## 2019-07-05 DIAGNOSIS — F1120 Opioid dependence, uncomplicated: Secondary | ICD-10-CM | POA: Diagnosis present

## 2019-07-05 DIAGNOSIS — R42 Dizziness and giddiness: Secondary | ICD-10-CM | POA: Diagnosis not present

## 2019-07-05 DIAGNOSIS — R4020 Unspecified coma: Secondary | ICD-10-CM | POA: Diagnosis not present

## 2019-07-05 LAB — TROPONIN I (HIGH SENSITIVITY): Troponin I (High Sensitivity): 4 ng/L (ref <18)

## 2019-07-05 LAB — AMMONIA: Ammonia: 25 umol/L (ref 9–35)

## 2019-07-05 MED ORDER — OXYCODONE-ACETAMINOPHEN 5-325 MG PO TABS
2.0000 | ORAL_TABLET | Freq: Once | ORAL | Status: AC
  Administered 2019-07-05: 2 via ORAL
  Filled 2019-07-05: qty 2

## 2019-07-05 MED ORDER — HYDROXYZINE HCL 25 MG PO TABS
25.0000 mg | ORAL_TABLET | Freq: Three times a day (TID) | ORAL | Status: DC | PRN
  Administered 2019-07-07: 25 mg via ORAL
  Filled 2019-07-05: qty 1

## 2019-07-05 MED ORDER — IBUPROFEN 400 MG PO TABS
400.0000 mg | ORAL_TABLET | Freq: Four times a day (QID) | ORAL | Status: DC | PRN
  Administered 2019-07-05 – 2019-07-09 (×8): 400 mg via ORAL
  Filled 2019-07-05 (×8): qty 1

## 2019-07-05 MED ORDER — QUETIAPINE FUMARATE 25 MG PO TABS: 25.0000 mg | ORAL_TABLET | Freq: Every day | ORAL | 0 refills | Status: AC

## 2019-07-05 MED ORDER — CITALOPRAM HYDROBROMIDE 20 MG PO TABS
10.0000 mg | ORAL_TABLET | Freq: Every day | ORAL | Status: DC
  Administered 2019-07-05 – 2019-07-09 (×5): 10 mg via ORAL
  Filled 2019-07-05 (×5): qty 1

## 2019-07-05 MED ORDER — KETOROLAC TROMETHAMINE 30 MG/ML IJ SOLN
15.0000 mg | Freq: Once | INTRAMUSCULAR | Status: AC
  Administered 2019-07-08: 15 mg via INTRAVENOUS
  Filled 2019-07-05: qty 1

## 2019-07-05 MED ORDER — QUETIAPINE FUMARATE 25 MG PO TABS: 25.0000 mg | ORAL_TABLET | Freq: Every day | ORAL | 0 refills | Status: DC

## 2019-07-05 MED ORDER — QUETIAPINE FUMARATE 25 MG PO TABS
25.0000 mg | ORAL_TABLET | Freq: Every day | ORAL | Status: DC
  Administered 2019-07-05 – 2019-07-08 (×4): 25 mg via ORAL
  Filled 2019-07-05 (×4): qty 1

## 2019-07-05 MED ORDER — OXYCODONE-ACETAMINOPHEN 5-325 MG PO TABS
1.0000 | ORAL_TABLET | Freq: Once | ORAL | Status: AC
  Administered 2019-07-05: 1 via ORAL
  Filled 2019-07-05 (×2): qty 1

## 2019-07-05 MED ORDER — IBUPROFEN 800 MG PO TABS: 800.0000 mg | ORAL_TABLET | Freq: Three times a day (TID) | ORAL | Status: DC | PRN

## 2019-07-05 MED ORDER — TRAZODONE HCL 50 MG PO TABS: 50.0000 mg | ORAL_TABLET | Freq: Every day | ORAL | Status: DC

## 2019-07-05 MED ORDER — ACETAMINOPHEN 325 MG PO TABS
650.0000 mg | ORAL_TABLET | Freq: Once | ORAL | Status: AC
  Administered 2019-07-05: 650 mg via ORAL
  Filled 2019-07-05: qty 2

## 2019-07-05 MED ORDER — GABAPENTIN 100 MG PO CAPS
100.0000 mg | ORAL_CAPSULE | Freq: Three times a day (TID) | ORAL | Status: DC
  Administered 2019-07-05 – 2019-07-09 (×12): 100 mg via ORAL
  Filled 2019-07-05 (×18): qty 1

## 2019-07-05 NOTE — ED Notes (Signed)
PT ambulated to toilet at this time with no problems.
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
## 2019-07-05 NOTE — Discharge Instructions (Addendum)
Continue taking your home medications are prescribed.  [facility] ([facility]) [facility]  Mental health service in [place], [place] Address: [address], [place],  [address] Hours:  Open ? Closes 5PM Phone: [phone]

## 2019-07-05 NOTE — ED Notes (Signed)
Pt given meal tray at this time. Pt made aware that she may only have tylenol for pain.

## 2019-07-05 NOTE — ED Notes (Signed)
Referral information for Psychiatric Hospitalization faxed to;   [facility] [phone]),    . [facility] [phone] -or- [phone]) [phone]  . [facility] ([phone]---[phone]),  . [facility] [phone], [phone], [phone] or [phone]),   . [facility]-([phone]   . [facility] [phone] or [phone])  . [facility] [phone] or [phone]),

## 2019-07-05 NOTE — ED Provider Notes (Signed)
[facility] Emergency Department Provider Note   First MD Initiated Contact with Patient 07/05/19 [phone]     (approximate)  I have reviewed the triage vital signs and the nursing notes.   HISTORY  Chief Complaint Dizziness    HPI Whitney Marks is a 58 y.o. female with below list of previous medical conditions presents to the emergency department secondary to auditory and visual hallucinations.  Patient states that she was discharged from [facility] yesterday's secondary to the same with reported combative behavior.  Patient denies any SI no HI.  Patient states that she continues to have both auditory and visual hallucinations.       Past Medical History:  Diagnosis Date  . AKI (acute kidney injury) ([place])   . Anxiety   . Arthritis   . 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[place])   . Chronic pain   . Depression   . Difficult intubation   . Fibromyalgia   . Headache(784.0)   . Neuromuscular disorder ([place])   . Pneumonia   . Recurrent upper respiratory infection (URI)   . Shortness of breath   . Sleep apnea     Patient Active Problem List   Diagnosis Date Noted  . Tracheostomy present ([place])   . Somnolence   . Acute respiratory failure with hypoxia ([place]) 01/27/2015  . Hypoglycemia 01/27/2015  . Hypoxic 01/27/2015  . Acute on chronic respiratory failure with hypoxia ([place])   . Altered mental status   . AKI (acute kidney injury) ([place])   . Respiratory acidosis   . Tracheostomy dependence ([place])   . Hypotensive episode   . Septic shock(785.52) 01/11/2014  . Acute respiratory failure ([place]) 01/09/2014  . Pneumonia 01/09/2014  . ARDS (adult respiratory distress syndrome) ([place]) 10/30/2011  . CAP (community acquired pneumonia) 10/30/2011  . Aspiration pneumonia ([place]) 10/30/2011  . Encephalopathy acute 10/30/2011  . 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 Severe sepsis(995.92) 10/30/2011    Past  Surgical History:  Procedure Laterality Date  . LARYNGOSCOPY N/A 01/24/2014   Procedure: MICRO LARYNGOSCOPY;  Surgeon: Melida Quitter, MD;  Location: [facility];  Service: ENT;  Laterality: N/A;  MICRO DIRECT LARYNGOSCOPY  . NECK SURGERY    . TRACHEOSTOMY      Prior to Admission medications   Medication Sig Start Date End Date Taking? Authorizing Provider  albuterol (PROVENTIL HFA;VENTOLIN HFA) 108 (90 Base) MCG/ACT inhaler Inhale 1-2 puffs into the lungs every 6 (six) hours as needed for wheezing or shortness of breath.    [provider]  butalbital-acetaminophen-caffeine (FIORICET, ESGIC) 50-325-40 MG tablet Take 1 tablet by mouth 2 (two) times daily as needed for headache.    [provider]  diazepam (VALIUM) 10 MG tablet Take 10 mg by mouth every 12 (twelve) hours as needed for anxiety.     [provider]  FLUoxetine (PROZAC) 10 MG capsule Take 2 capsules (20 mg total) by mouth daily. Patient not taking: Reported on 07/26/2018 01/29/15   Allie Bossier, MD  promethazine (PHENERGAN) 12.5 MG tablet Take 1 tablet (12.5 mg total) by mouth every 6 (six) hours as needed for nausea or vomiting. Patient not taking: Reported on 10/28/2018 01/27/16   Little, Wenda Overland, MD  traZODone (DESYREL) 100 MG tablet Take 100-300 mg by mouth at bedtime.  09/23/15   [provider]    Allergies Lyrica [pregabalin], Other, Pentazocine lactate, Ranitidine hcl, Tobramycin, Zofran [ondansetron hcl], Ranitidine, Azithromycin, Codeine,  and Reglan [metoclopramide]  No family history on file.  Social History Social History   Tobacco Use  . Smoking status: Never Smoker  . Smokeless tobacco: Never Used  Substance Use Topics  . Alcohol use: Yes    Comment: occ  . Drug use: No    Review of Systems Constitutional: No fever/chills Eyes: No visual changes. ENT: No sore throat. Cardiovascular: Denies chest pain. Respiratory: Denies shortness of breath. Gastrointestinal: No  abdominal pain.  No nausea, no vomiting.  No diarrhea.  No constipation. Genitourinary: Negative for dysuria. Musculoskeletal: Negative for neck pain.  Negative for back pain. Integumentary: Negative for rash. Neurological: Negative for headaches, focal weakness or numbness. Psychiatric:  Positive for auditory and visual hallucinations   ____________________________________________   PHYSICAL EXAM:  VITAL SIGNS: ED Triage Vitals  Enc Vitals Group     BP 07/04/19 2302 (!) 149/112     Pulse Rate 07/04/19 2302 92     Resp 07/04/19 2302 18     Temp 07/04/19 2302 98.2 F (36.8 C)     Temp Source 07/04/19 2302 Oral     SpO2 07/04/19 2302 97 %     Weight 07/04/19 2253 61.7 kg (136 lb)     Height 07/04/19 2253 1.575 m (5\' 2" )     Head Circumference --      Peak Flow --      Pain Score 07/04/19 2252 9     Pain Loc --      Pain Edu? --      Excl. in [place]? --     Constitutional: Alert and oriented.  Eyes: Conjunctivae are normal.  Head: Atraumatic. Mouth/Throat: Mucous membranes are moist. Neck: No stridor.  No meningeal signs.   Cardiovascular: Normal rate, regular rhythm. Good peripheral circulation. Grossly normal heart sounds. Respiratory: Normal respiratory effort.  No retractions. Gastrointestinal: Soft and nontender. No distention.  Musculoskeletal: No lower extremity tenderness nor edema. No gross deformities of extremities. Neurologic:  Normal speech and language. No gross focal neurologic deficits are appreciated.  Skin: Multiple bruises noted bilateral upper and lower extremity with differing stages of healing Psychiatric: Mood and affect are normal. Speech and behavior are normal.  ____________________________________________   LABS (all labs ordered are listed, but only abnormal results are displayed)  Labs Reviewed  BASIC METABOLIC PANEL - Abnormal; Notable for the following components:      Result Value   Chloride 114 (*)    CO2 21 (*)    Glucose, Bld 100 (*)     Calcium 8.6 (*)    All other components within normal limits  CBC  URINALYSIS, COMPLETE (UACMP) WITH MICROSCOPIC  TROPONIN I (HIGH SENSITIVITY)   ____________________________________________  EKG  ED ECG REPORT I, [place] N Mowatt, the attending physician, personally viewed and interpreted this ECG.   Date: 07/04/2019  EKG Time: 11:09 PM  Rate: 92  Rhythm: Normal sinus rhythm  Axis: Normal  Intervals: Normal  ST&T Change: None  ____________________________________________  RADIOLOGY I, [place] N Kammerer, personally viewed and evaluated these images (plain radiographs) as part of my medical decision making, as well as reviewing the written report by the radiologist.  ED MD interpretation: No evidence of acute intracranial abnormality noted on CT head.  Official radiology report(s): Ct Head Wo Contrast  Result Date: 07/05/2019 CLINICAL DATA:  Altered level of consciousness. Hallucinations. EXAM: CT HEAD WITHOUT CONTRAST TECHNIQUE: Contiguous axial images were obtained from the base of the skull through the vertex without intravenous contrast. COMPARISON:  Head CT 07/24/2018 and MRI 07/26/2018 FINDINGS: Brain: There is no evidence of acute infarct, intracranial hemorrhage, mass, midline shift, or extra-axial fluid collection. The ventricles are normal in size. Chronic infarcts involving the cerebellum bilaterally and left parietal lobe are unchanged. Vascular: No hyperdense vessel. Skull: No fracture or focal osseous lesion. Sinuses/Orbits: Visualized paranasal sinuses and mastoid air cells are clear. Visualized orbits are unremarkable. Other: None. IMPRESSION: 1. No evidence of acute intracranial abnormality. 2. Chronic cerebellar and left parietal infarcts. Electronically Signed   By: Logan Bores M.D.   On: 07/05/2019 05:41     Procedures   ____________________________________________   INITIAL IMPRESSION / MDM / [place] / ED COURSE  As part of my medical  decision making, I reviewed the following data within the electronic MEDICAL RECORD NUMBER  58 year old female presenting with above-stated history and physical exam secondary to auditory and visual hallucinations.  CT head revealed no acute intracranial abnormality chronic cerebellar and left frontal infarcts noted.  Additional laboratory data including ammonia level pending at this time.  Also consult psychiatry.  Review of the patient's discharge paperwork from [facility] referred the patient for psychiatry outpatient follow-up. ____________________________________________  FINAL CLINICAL IMPRESSION(S) / ED DIAGNOSES  Final diagnoses:  Hallucinations     MEDICATIONS GIVEN DURING THIS VISIT:  Medications  oxyCODONE-acetaminophen (PERCOCET/ROXICET) 5-325 MG per tablet 2 tablet (2 tablets Oral Given 07/05/19 0506)     ED Discharge Orders    None      *Please note:  Whitney Marks was evaluated in Emergency Department on 07/05/2019 for the symptoms described in the history of present illness. She was evaluated in the context of the global COVID-19 pandemic, which necessitated consideration that the patient might be at risk for infection with the SARS-CoV-2 virus that causes COVID-19. Institutional protocols and algorithms that pertain to the evaluation of patients at risk for COVID-19 are in a state of rapid change based on information released by regulatory bodies including the CDC and federal and state organizations. These policies and algorithms were followed during the patient's care in the ED.  Some ED evaluations and interventions may be delayed as a result of limited staffing during the pandemic.*  Note:  This document was prepared using Dragon voice recognition software and may include unintentional dictation errors.   Gregor Hams, MD 07/05/19 [phone]

## 2019-07-05 NOTE — ED Notes (Signed)
Pt given socks per request

## 2019-07-05 NOTE — ED Notes (Signed)
Pt given remote to pt by request.
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
## 2019-07-05 NOTE — ED Notes (Signed)
Lab contacted to send phlebotomist for ammonia draw

## 2019-07-05 NOTE — ED Notes (Signed)
Pt given remote per request 

## 2019-07-05 NOTE — ED Notes (Signed)
Pt placed on 2L sats 88% pt tachypnic bc she states she is in pain, pt given 400mg  advil.

## 2019-07-05 NOTE — ED Notes (Signed)
Pt asked to speak with doctor. MD Isaacs notified.
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
## 2019-07-05 NOTE — ED Notes (Signed)
Patient given breakfast tray.

## 2019-07-06 DIAGNOSIS — R42 Dizziness and giddiness: Secondary | ICD-10-CM | POA: Diagnosis not present

## 2019-07-06 DIAGNOSIS — F1920 Other psychoactive substance dependence, uncomplicated: Secondary | ICD-10-CM | POA: Diagnosis not present

## 2019-07-06 DIAGNOSIS — F23 Brief psychotic disorder: Secondary | ICD-10-CM | POA: Diagnosis not present

## 2019-07-06 LAB — SARS CORONAVIRUS 2 BY RT PCR (HOSPITAL ORDER, PERFORMED IN ~~LOC~~ HOSPITAL LAB): SARS Coronavirus 2: NEGATIVE

## 2019-07-06 NOTE — ED Notes (Addendum)
Pt undressed at this time by this RN and Amy T RN, pt has 3 bags,  Including 1 specimen cup with 5 rings in it that is inside bag 2/3.  Pt had pocketbook, shorts, t-shirt and paperwork from [facility].

## 2019-07-06 NOTE — ED Notes (Signed)
She went to the BR to perform her own trach care

## 2019-07-06 NOTE — ED Notes (Signed)
BEHAVIORAL HEALTH ROUNDING Patient sleeping: No. Patient alert and oriented: yes Behavior appropriate: Yes.  ; If no, describe:  Nutrition and fluids offered: yes Toileting and hygiene offered: Yes  Sitter present: q15 minute observations  

## 2019-07-06 NOTE — BH Assessment (Signed)
Patient is on waiting list for [facility].

## 2019-07-06 NOTE — ED Notes (Signed)
ED BHU [place] Is the patient under IVC or is there intent for IVC: Yes.   Is the patient medically cleared: Yes.   Is there vacancy in the ED BHU: Yes.   Is the population mix appropriate for patient: Yes.   She has a trach Is the patient awaiting placement in inpatient or outpatient setting: Yes.   Has the patient had a psychiatric consult: Yes.   Survey of unit performed for contraband, proper placement and condition of furniture, tampering with fixtures in bathroom, shower, and each patient room: Yes.  ; Findings:  APPEARANCE/BEHAVIOR Calm and cooperative NEURO ASSESSMENT Orientation: oriented x3   Hallucinations: No.None noted (Hallucinations) denies  Speech: Normal Gait: normal RESPIRATORY ASSESSMENT Even  Unlabored respirations  CARDIOVASCULAR ASSESSMENT Pulses equal   regular rate  Skin warm and dry   GASTROINTESTINAL ASSESSMENT no GI complaint EXTREMITIES Full ROM  PLAN OF CARE Provide calm/safe environment. Vital signs assessed twice daily. ED BHU Assessment once each 12-hour shift.  Assure the ED provider has rounded once each shift. Provide and encourage hygiene. Provide redirection as needed. Assess for escalating behavior; address immediately and inform ED provider.  Assess family dynamic and appropriateness for visitation as needed: Yes.  ; If necessary, describe findings:  Educate the patient/family about BHU procedures/visitation: Yes.  ; If necessary, describe findings:

## 2019-07-06 NOTE — ED Provider Notes (Signed)
Psychiatric plans to IVC.  Concern that patient was taking her own medications while in the room.    Vanessa Guayanilla, MD 07/06/19 [phone]

## 2019-07-06 NOTE — BH Assessment (Signed)
[facility] ED Referral Information was sent to the following:  [facility]  [facility]

## 2019-07-06 NOTE — ED Notes (Signed)
Pt resting quietly in room at this time, respirations equal and unlabored. Breakfast tray placed at bedside.  Will continue to monitor. Pt awaiting placement at this time.

## 2019-07-06 NOTE — ED Notes (Signed)
BEHAVIORAL HEALTH ROUNDING Patient sleeping: No. Patient alert and oriented: yes Behavior appropriate: Yes.  ; If no, describe:  Nutrition and fluids offered: yes Toileting and hygiene offered: Yes  Sitter present: q15 minute observations   ENVIRONMENTAL ASSESSMENT Potentially harmful objects out of patient reach: Yes.   Personal belongings secured: Yes.   Patient dressed in hospital provided attire only: Yes.   Plastic bags out of patient reach: Yes.   Patient care equipment (cords, cables, call bells, lines, and drains) shortened, removed, or accounted for: Yes.   Equipment and supplies removed from bottom of stretcher: Yes.   Potentially toxic materials out of patient reach: Yes.   Sharps container removed or out of patient reach: Yes.

## 2019-07-06 NOTE — ED Provider Notes (Signed)
-----------------------------------------   5:30 AM on 07/06/2019 -----------------------------------------   Blood pressure (!) 167/95, pulse 69, temperature 98.2 F (36.8 C), temperature source Oral, resp. rate (!) 28, height 5\' 2"  (1.575 m), weight 61.7 kg, SpO2 97 %.  The patient had no acute events since last update.  Calm and cooperative at this time.  Disposition is pending per Psychiatry/Behavioral Medicine team recommendations.     Alfred Levins, [place], MD 07/06/19 0530

## 2019-07-06 NOTE — ED Notes (Signed)
Pt given phone to use at this time  
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
## 2019-07-06 NOTE — ED Notes (Signed)
Pt stated "I want to go home" informed pt she cannot leave that she will be admitted for psychiatric reasons and that she is under involuntary commitment. Pt remained calm at this time. PT given AM meds and sprite to drink.

## 2019-07-06 NOTE — BH Assessment (Signed)
Patient is under review for admission to [facility].

## 2019-07-06 NOTE — ED Notes (Addendum)
Introduced self to pt. Pt updated on waiting for placement. Pt denies any needs at this time. Gives this RN permission to call son.

## 2019-07-06 NOTE — ED Notes (Signed)
Son, Whitney Marks updated via phone about waiting for placement. Son reports he has sent his POA paperwork to hospital. Pt agrees he is POA and gives this RN permission to update son.

## 2019-07-06 NOTE — ED Notes (Signed)
Pt sleeping in bed, even unlabored respirations noted. Pt has o2 on finger, will update bp when pt wakes up.

## 2019-07-07 DIAGNOSIS — R42 Dizziness and giddiness: Secondary | ICD-10-CM | POA: Diagnosis not present

## 2019-07-07 DIAGNOSIS — F23 Brief psychotic disorder: Secondary | ICD-10-CM | POA: Diagnosis not present

## 2019-07-07 MED ORDER — PROMETHAZINE HCL 25 MG PO TABS
12.5000 mg | ORAL_TABLET | Freq: Once | ORAL | Status: AC
  Administered 2019-07-07: 12.5 mg via ORAL
  Filled 2019-07-07: qty 1

## 2019-07-07 MED ORDER — OXYCODONE-ACETAMINOPHEN 5-325 MG PO TABS
1.0000 | ORAL_TABLET | ORAL | Status: DC | PRN
  Administered 2019-07-07: 1 via ORAL
  Filled 2019-07-07: qty 1

## 2019-07-07 MED ORDER — OXYCODONE-ACETAMINOPHEN 5-325 MG PO TABS
1.0000 | ORAL_TABLET | ORAL | Status: AC
  Administered 2019-07-07: 1 via ORAL
  Filled 2019-07-07: qty 1

## 2019-07-07 NOTE — ED Provider Notes (Signed)
-----------------------------------------   6:34 AM on 07/07/2019 -----------------------------------------   Blood pressure (!) 162/98, pulse 87, temperature 98.2 F (36.8 C), temperature source Oral, resp. rate (!) 22, height 5\' 2"  (1.575 m), weight 61.7 kg, SpO2 96 %.  The patient is calm and cooperative at this time.  There have been no acute events since the last update.  Awaiting disposition plan from Behavioral Medicine.   Paulette Blanch, MD 07/07/19 [phone]

## 2019-07-07 NOTE — BH Assessment (Signed)
TTS refaxed referral information to [facility].[phone]

## 2019-07-07 NOTE — BH Assessment (Signed)
[facility] is unable to accept the patient due to acuity of her medical condition (trach).

## 2019-07-07 NOTE — ED Notes (Signed)
Report given to Amy

## 2019-07-07 NOTE — ED Notes (Signed)
Pt was given meal tray.  
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
## 2019-07-07 NOTE — ED Notes (Signed)
Pt taking a shower. No behavioral issue. Maintained on 15 minute checks.

## 2019-07-07 NOTE — ED Notes (Signed)
Pt took shower °

## 2019-07-07 NOTE — ED Notes (Signed)
Pt given meal tray.

## 2019-07-08 DIAGNOSIS — R42 Dizziness and giddiness: Secondary | ICD-10-CM | POA: Diagnosis not present

## 2019-07-08 MED ORDER — OXYCODONE-ACETAMINOPHEN 5-325 MG PO TABS
1.0000 | ORAL_TABLET | ORAL | Status: AC
  Administered 2019-07-08: 1 via ORAL
  Filled 2019-07-08: qty 1

## 2019-07-08 MED ORDER — BACLOFEN 10 MG PO TABS
20.0000 mg | ORAL_TABLET | ORAL | Status: AC
  Administered 2019-07-08: 20 mg via ORAL
  Filled 2019-07-08: qty 2

## 2019-07-08 NOTE — ED Notes (Addendum)
Pt asking for a percocet. PTA meds in system says twice a day. Attempting to verify this is correct. Reading the d/c notes from Dr Owens Shark, percocet are BID. Pt is followed by pain clinic.

## 2019-07-08 NOTE — ED Notes (Signed)
Pt c/o back pain. States back is in knots. edp notified - meds ordered.

## 2019-07-08 NOTE — ED Provider Notes (Signed)
-----------------------------------------   7:36 AM on 07/08/2019 -----------------------------------------   Blood pressure (!) 176/100, pulse 71, temperature 98 F (36.7 C), temperature source Oral, resp. rate 20, height 5\' 2"  (1.575 m), weight 61.7 kg, SpO2 97 %.  The patient is calm and cooperative at this time.  There have been no acute events since the last update.  Awaiting disposition plan from Behavioral Medicine and/or Social Work team(s).    Duffy Bruce, MD 07/08/19 [phone]

## 2019-07-08 NOTE — Social Work (Addendum)
CSW attempted to contact patient's POA, Javiona Baich [phone]) to see if he is okay with patient going home with home health.  Voicemail left.   11:21am - Attempted to contact patient's daughter, and son (POA), no response.    [place], [place] ED  [phone]

## 2019-07-08 NOTE — ED Notes (Signed)
Pt given trach cleaning kit at her request to clean trach - ED tech remained with pt and collected used kit.

## 2019-07-08 NOTE — ED Notes (Signed)
Patient is resting comfortably. 

## 2019-07-08 NOTE — ED Notes (Signed)
Pt requesting pain medicine for back pain 9/10. Pt given scheduled neurontin and prn ibuprofen.

## 2019-07-08 NOTE — ED Notes (Signed)
Pt c/o back pain. Pt has chronic back pain - edp notified. meds ordered.

## 2019-07-09 DIAGNOSIS — R42 Dizziness and giddiness: Secondary | ICD-10-CM | POA: Diagnosis not present

## 2019-07-09 MED ORDER — OXYCODONE-ACETAMINOPHEN 5-325 MG PO TABS
1.0000 | ORAL_TABLET | ORAL | Status: AC
  Administered 2019-07-09: 1 via ORAL
  Filled 2019-07-09: qty 1

## 2019-07-09 NOTE — ED Provider Notes (Signed)
-----------------------------------------   7:20 AM on 07/09/2019 -----------------------------------------   Blood pressure (!) 182/93, pulse 96, temperature 98.5 F (36.9 C), temperature source Oral, resp. rate 16, height 5\' 2"  (1.575 m), weight 61.7 kg, SpO2 100 %.  The patient is calm and cooperative at this time.  There have been no acute events since the last update.  Awaiting disposition plan from Behavioral Medicine.   Paulette Blanch, MD 07/09/19 [phone]

## 2019-07-09 NOTE — ED Notes (Signed)
Pt resting on stretcher with lights off to enhance rest and comfort. Pt asked if she had already called her family to pick her up. Pt states she had called her daughter and was told her son would come pick up pt sometime after he got off work at 70. Pt informed if we do not hear from her family by 8 we would call them to confirm pick up time. Verbalized understanding.

## 2019-07-09 NOTE — ED Notes (Signed)
Pt waiting on family to come pick her up. D/c paperwork ready. IVC rescinded.

## 2019-07-09 NOTE — ED Notes (Signed)
Daughter called this RN back. Daughter reports that pt is not welcome at the home she was supposed to go back to as her and her husband are separated. Daughter reports that mother cannot take her medications correctly and that is why she has such trouble. Daughter and son Henrietta Dine) do not feel that she is safe to go home, and reports that she does not really have a home to go back to at this point.

## 2019-07-09 NOTE — ED Notes (Signed)
Pt denies any SI/HI or voices at this time. Pt resting comfortably in bed. Pt says that she did sleep pretty good. Pt AOx4, calm, cooperative. Pt denies any further needs at this time.

## 2019-07-09 NOTE — ED Provider Notes (Signed)
Procedures  Clinical Course as of Jul 08 1704  Fri Jul 05, 2019  1015 Patient is pending psych evaluation.   [JW]    Clinical Course User Index [JW] Earleen Newport, MD    ----------------------------------------- 5:05 PM on 07/09/2019 -----------------------------------------  Notes reviewed.  Patient was cleared by psychiatry 2 days ago.  She has remained calm and cooperative during this time, taking her medicines and feeling back to baseline.  To facilitate a safe disposition, social work has been pursuing residential care for the patient or home health services, and patient's family has been agreeable to home health services which are being arranged for nursing, home health aide, social work.  She is medically and psychiatrically stable can continue her medications at home and follow-up with [facility].  Family is agreeable to discharge plan.   Carrie Mew, MD 07/09/19 [phone]

## 2019-07-09 NOTE — ED Notes (Signed)
Both son and daughter attempted to be contacted by this RN. No success.

## 2019-07-09 NOTE — Social Work (Addendum)
Patient's son and POA, Laverna Peace is considering home health for patient. He stated that he will consult with his sisters.   2:00pm - CSW called patient's son, Laverna Peace to follow-up. He stated that he was at work, and will return call to [place].   [place], [place] ED  [phone]

## 2019-07-09 NOTE — Social Work (Signed)
Patient has been set up with [facility] by Helene Kelp for RN, nurse aide, and social work.  EDP ordered face to face and home health.  Patient's son/POA notified.    CSW signing off.    [place], [place] ED  [phone]

## 2019-07-09 NOTE — ED Notes (Signed)
This RN spoke to son and social work also speaking to son over the phone.

## 2019-07-09 NOTE — ED Notes (Signed)
Pt given meal tray. Pt requests something for her back pain.

## 2019-07-09 NOTE — ED Notes (Signed)
Pt given meal tray.

## 2019-07-23 DIAGNOSIS — Z930 Tracheostomy status: Secondary | ICD-10-CM | POA: Diagnosis not present

## 2019-07-23 DIAGNOSIS — R1319 Other dysphagia: Secondary | ICD-10-CM | POA: Diagnosis not present

## 2019-07-23 DIAGNOSIS — J398 Other specified diseases of upper respiratory tract: Secondary | ICD-10-CM | POA: Diagnosis not present

## 2019-07-23 DIAGNOSIS — J9690 Respiratory failure, unspecified, unspecified whether with hypoxia or hypercapnia: Secondary | ICD-10-CM | POA: Diagnosis not present

## 2019-07-31 DIAGNOSIS — K30 Functional dyspepsia: Secondary | ICD-10-CM | POA: Diagnosis not present

## 2019-07-31 DIAGNOSIS — K5900 Constipation, unspecified: Secondary | ICD-10-CM | POA: Diagnosis not present

## 2019-07-31 DIAGNOSIS — G8929 Other chronic pain: Secondary | ICD-10-CM | POA: Diagnosis not present

## 2019-07-31 DIAGNOSIS — Z6823 Body mass index (BMI) 23.0-23.9, adult: Secondary | ICD-10-CM | POA: Diagnosis not present

## 2019-07-31 DIAGNOSIS — M549 Dorsalgia, unspecified: Secondary | ICD-10-CM | POA: Diagnosis not present

## 2019-10-09 DIAGNOSIS — M549 Dorsalgia, unspecified: Secondary | ICD-10-CM | POA: Diagnosis not present

## 2019-10-09 DIAGNOSIS — Z6825 Body mass index (BMI) 25.0-25.9, adult: Secondary | ICD-10-CM | POA: Diagnosis not present

## 2019-10-09 DIAGNOSIS — G629 Polyneuropathy, unspecified: Secondary | ICD-10-CM | POA: Diagnosis not present

## 2019-10-09 DIAGNOSIS — G4700 Insomnia, unspecified: Secondary | ICD-10-CM | POA: Diagnosis not present

## 2020-01-02 DIAGNOSIS — G894 Chronic pain syndrome: Secondary | ICD-10-CM | POA: Diagnosis not present

## 2020-01-02 DIAGNOSIS — G890 Central pain syndrome: Secondary | ICD-10-CM | POA: Diagnosis not present

## 2020-01-02 DIAGNOSIS — M542 Cervicalgia: Secondary | ICD-10-CM | POA: Diagnosis not present

## 2020-01-02 DIAGNOSIS — M546 Pain in thoracic spine: Secondary | ICD-10-CM | POA: Diagnosis not present

## 2020-01-02 DIAGNOSIS — G8929 Other chronic pain: Secondary | ICD-10-CM | POA: Diagnosis not present

## 2020-01-02 DIAGNOSIS — Z79891 Long term (current) use of opiate analgesic: Secondary | ICD-10-CM | POA: Diagnosis not present

## 2020-01-02 DIAGNOSIS — M545 Low back pain: Secondary | ICD-10-CM | POA: Diagnosis not present

## 2020-01-02 DIAGNOSIS — M5412 Radiculopathy, cervical region: Secondary | ICD-10-CM | POA: Diagnosis not present

## 2020-01-21 DIAGNOSIS — J398 Other specified diseases of upper respiratory tract: Secondary | ICD-10-CM | POA: Diagnosis not present

## 2020-01-21 DIAGNOSIS — R1319 Other dysphagia: Secondary | ICD-10-CM | POA: Diagnosis not present

## 2020-01-21 DIAGNOSIS — Z930 Tracheostomy status: Secondary | ICD-10-CM | POA: Diagnosis not present

## 2020-01-21 DIAGNOSIS — J9690 Respiratory failure, unspecified, unspecified whether with hypoxia or hypercapnia: Secondary | ICD-10-CM | POA: Diagnosis not present

## 2020-01-27 DIAGNOSIS — Z03818 Encounter for observation for suspected exposure to other biological agents ruled out: Secondary | ICD-10-CM | POA: Diagnosis not present

## 2020-01-30 DIAGNOSIS — G8929 Other chronic pain: Secondary | ICD-10-CM | POA: Diagnosis not present

## 2020-01-30 DIAGNOSIS — M545 Low back pain: Secondary | ICD-10-CM | POA: Diagnosis not present

## 2020-01-30 DIAGNOSIS — Z79891 Long term (current) use of opiate analgesic: Secondary | ICD-10-CM | POA: Diagnosis not present

## 2020-01-30 DIAGNOSIS — G894 Chronic pain syndrome: Secondary | ICD-10-CM | POA: Diagnosis not present

## 2020-01-30 DIAGNOSIS — G890 Central pain syndrome: Secondary | ICD-10-CM | POA: Diagnosis not present

## 2020-01-30 DIAGNOSIS — M542 Cervicalgia: Secondary | ICD-10-CM | POA: Diagnosis not present

## 2020-01-30 DIAGNOSIS — M546 Pain in thoracic spine: Secondary | ICD-10-CM | POA: Diagnosis not present

## 2020-01-30 DIAGNOSIS — M5412 Radiculopathy, cervical region: Secondary | ICD-10-CM | POA: Diagnosis not present

## 2020-02-04 DIAGNOSIS — M79605 Pain in left leg: Secondary | ICD-10-CM | POA: Diagnosis not present

## 2020-02-11 DIAGNOSIS — S82832A Other fracture of upper and lower end of left fibula, initial encounter for closed fracture: Secondary | ICD-10-CM | POA: Diagnosis not present

## 2020-02-15 DIAGNOSIS — R1031 Right lower quadrant pain: Secondary | ICD-10-CM | POA: Diagnosis not present

## 2020-02-15 DIAGNOSIS — R1033 Periumbilical pain: Secondary | ICD-10-CM | POA: Diagnosis not present

## 2020-02-15 DIAGNOSIS — R1084 Generalized abdominal pain: Secondary | ICD-10-CM | POA: Diagnosis not present

## 2020-02-15 DIAGNOSIS — M5489 Other dorsalgia: Secondary | ICD-10-CM | POA: Diagnosis not present

## 2020-02-15 DIAGNOSIS — I10 Essential (primary) hypertension: Secondary | ICD-10-CM | POA: Diagnosis not present

## 2020-02-15 DIAGNOSIS — R112 Nausea with vomiting, unspecified: Secondary | ICD-10-CM | POA: Diagnosis not present

## 2020-02-25 DIAGNOSIS — Z03818 Encounter for observation for suspected exposure to other biological agents ruled out: Secondary | ICD-10-CM | POA: Diagnosis not present

## 2020-02-27 DIAGNOSIS — M542 Cervicalgia: Secondary | ICD-10-CM | POA: Diagnosis not present

## 2020-02-27 DIAGNOSIS — G894 Chronic pain syndrome: Secondary | ICD-10-CM | POA: Diagnosis not present

## 2020-02-27 DIAGNOSIS — G8929 Other chronic pain: Secondary | ICD-10-CM | POA: Diagnosis not present

## 2020-02-27 DIAGNOSIS — G890 Central pain syndrome: Secondary | ICD-10-CM | POA: Diagnosis not present

## 2020-02-27 DIAGNOSIS — Z79891 Long term (current) use of opiate analgesic: Secondary | ICD-10-CM | POA: Diagnosis not present

## 2020-02-27 DIAGNOSIS — M546 Pain in thoracic spine: Secondary | ICD-10-CM | POA: Diagnosis not present

## 2020-02-27 DIAGNOSIS — M545 Low back pain: Secondary | ICD-10-CM | POA: Diagnosis not present

## 2020-02-27 DIAGNOSIS — M5412 Radiculopathy, cervical region: Secondary | ICD-10-CM | POA: Diagnosis not present

## 2020-03-19 DIAGNOSIS — G894 Chronic pain syndrome: Secondary | ICD-10-CM | POA: Diagnosis not present

## 2020-03-19 DIAGNOSIS — M545 Low back pain: Secondary | ICD-10-CM | POA: Diagnosis not present

## 2020-03-19 DIAGNOSIS — M542 Cervicalgia: Secondary | ICD-10-CM | POA: Diagnosis not present

## 2020-03-19 DIAGNOSIS — Z79891 Long term (current) use of opiate analgesic: Secondary | ICD-10-CM | POA: Diagnosis not present

## 2020-03-19 DIAGNOSIS — G8929 Other chronic pain: Secondary | ICD-10-CM | POA: Diagnosis not present

## 2020-03-19 DIAGNOSIS — R202 Paresthesia of skin: Secondary | ICD-10-CM | POA: Diagnosis not present

## 2020-03-19 DIAGNOSIS — M5412 Radiculopathy, cervical region: Secondary | ICD-10-CM | POA: Diagnosis not present

## 2020-03-19 DIAGNOSIS — M546 Pain in thoracic spine: Secondary | ICD-10-CM | POA: Diagnosis not present

## 2020-03-25 DIAGNOSIS — Z9181 History of falling: Secondary | ICD-10-CM | POA: Diagnosis not present

## 2020-03-25 DIAGNOSIS — Z1231 Encounter for screening mammogram for malignant neoplasm of breast: Secondary | ICD-10-CM | POA: Diagnosis not present

## 2020-03-25 DIAGNOSIS — Z0000 Encounter for general adult medical examination without abnormal findings: Secondary | ICD-10-CM | POA: Diagnosis not present

## 2020-04-01 DIAGNOSIS — Z79891 Long term (current) use of opiate analgesic: Secondary | ICD-10-CM | POA: Diagnosis not present

## 2020-04-01 DIAGNOSIS — R202 Paresthesia of skin: Secondary | ICD-10-CM | POA: Diagnosis not present

## 2020-04-01 DIAGNOSIS — M545 Low back pain: Secondary | ICD-10-CM | POA: Diagnosis not present

## 2020-04-01 DIAGNOSIS — M546 Pain in thoracic spine: Secondary | ICD-10-CM | POA: Diagnosis not present

## 2020-04-01 DIAGNOSIS — M542 Cervicalgia: Secondary | ICD-10-CM | POA: Diagnosis not present

## 2020-04-01 DIAGNOSIS — G894 Chronic pain syndrome: Secondary | ICD-10-CM | POA: Diagnosis not present

## 2020-04-01 DIAGNOSIS — M5412 Radiculopathy, cervical region: Secondary | ICD-10-CM | POA: Diagnosis not present

## 2020-04-01 DIAGNOSIS — G8929 Other chronic pain: Secondary | ICD-10-CM | POA: Diagnosis not present

## 2020-04-07 DIAGNOSIS — Z79891 Long term (current) use of opiate analgesic: Secondary | ICD-10-CM | POA: Diagnosis not present

## 2020-04-07 DIAGNOSIS — M546 Pain in thoracic spine: Secondary | ICD-10-CM | POA: Diagnosis not present

## 2020-04-07 DIAGNOSIS — M542 Cervicalgia: Secondary | ICD-10-CM | POA: Diagnosis not present

## 2020-04-07 DIAGNOSIS — G894 Chronic pain syndrome: Secondary | ICD-10-CM | POA: Diagnosis not present

## 2020-04-07 DIAGNOSIS — R202 Paresthesia of skin: Secondary | ICD-10-CM | POA: Diagnosis not present

## 2020-04-07 DIAGNOSIS — M545 Low back pain: Secondary | ICD-10-CM | POA: Diagnosis not present

## 2020-04-07 DIAGNOSIS — G8929 Other chronic pain: Secondary | ICD-10-CM | POA: Diagnosis not present

## 2020-04-07 DIAGNOSIS — M5412 Radiculopathy, cervical region: Secondary | ICD-10-CM | POA: Diagnosis not present

## 2020-04-10 DIAGNOSIS — J9690 Respiratory failure, unspecified, unspecified whether with hypoxia or hypercapnia: Secondary | ICD-10-CM | POA: Diagnosis not present

## 2020-04-10 DIAGNOSIS — R1319 Other dysphagia: Secondary | ICD-10-CM | POA: Diagnosis not present

## 2020-04-10 DIAGNOSIS — Z930 Tracheostomy status: Secondary | ICD-10-CM | POA: Diagnosis not present

## 2020-04-10 DIAGNOSIS — J398 Other specified diseases of upper respiratory tract: Secondary | ICD-10-CM | POA: Diagnosis not present

## 2020-05-01 DIAGNOSIS — M546 Pain in thoracic spine: Secondary | ICD-10-CM | POA: Diagnosis not present

## 2020-05-01 DIAGNOSIS — Z79891 Long term (current) use of opiate analgesic: Secondary | ICD-10-CM | POA: Diagnosis not present

## 2020-05-01 DIAGNOSIS — M542 Cervicalgia: Secondary | ICD-10-CM | POA: Diagnosis not present

## 2020-05-01 DIAGNOSIS — G8929 Other chronic pain: Secondary | ICD-10-CM | POA: Diagnosis not present

## 2020-05-01 DIAGNOSIS — R202 Paresthesia of skin: Secondary | ICD-10-CM | POA: Diagnosis not present

## 2020-05-01 DIAGNOSIS — M545 Low back pain: Secondary | ICD-10-CM | POA: Diagnosis not present

## 2020-05-01 DIAGNOSIS — G894 Chronic pain syndrome: Secondary | ICD-10-CM | POA: Diagnosis not present

## 2020-05-01 DIAGNOSIS — M5412 Radiculopathy, cervical region: Secondary | ICD-10-CM | POA: Diagnosis not present

## 2020-05-13 ENCOUNTER — Other Ambulatory Visit: Payer: Self-pay

## 2020-05-13 ENCOUNTER — Emergency Department: Payer: Medicare Other

## 2020-05-13 ENCOUNTER — Inpatient Hospital Stay
Admission: EM | Admit: 2020-05-13 | Discharge: 2020-05-16 | DRG: 194 | Disposition: A | Discharge status: Home / Self Care | Payer: Medicare Other | Attending: Internal Medicine | Admitting: Internal Medicine

## 2020-05-13 ENCOUNTER — Encounter: Payer: Self-pay | Admitting: Emergency Medicine

## 2020-05-13 DIAGNOSIS — Z79899 Other long term (current) drug therapy: Secondary | ICD-10-CM | POA: Diagnosis not present

## 2020-05-13 DIAGNOSIS — K449 Diaphragmatic hernia without obstruction or gangrene: Secondary | ICD-10-CM | POA: Diagnosis not present

## 2020-05-13 DIAGNOSIS — E860 Dehydration: Secondary | ICD-10-CM | POA: Diagnosis not present

## 2020-05-13 DIAGNOSIS — G8929 Other chronic pain: Secondary | ICD-10-CM | POA: Diagnosis not present

## 2020-05-13 DIAGNOSIS — F329 Major depressive disorder, single episode, unspecified: Secondary | ICD-10-CM | POA: Diagnosis present

## 2020-05-13 DIAGNOSIS — Z888 Allergy status to other drugs, medicaments and biological substances status: Secondary | ICD-10-CM

## 2020-05-13 DIAGNOSIS — Z8701 Personal history of pneumonia (recurrent): Secondary | ICD-10-CM | POA: Diagnosis not present

## 2020-05-13 DIAGNOSIS — I517 Cardiomegaly: Secondary | ICD-10-CM | POA: Diagnosis not present

## 2020-05-13 DIAGNOSIS — F1120 Opioid dependence, uncomplicated: Secondary | ICD-10-CM | POA: Diagnosis not present

## 2020-05-13 DIAGNOSIS — Z930 Tracheostomy status: Secondary | ICD-10-CM | POA: Diagnosis not present

## 2020-05-13 DIAGNOSIS — F419 Anxiety disorder, unspecified: Secondary | ICD-10-CM | POA: Diagnosis present

## 2020-05-13 DIAGNOSIS — N179 Acute kidney failure, unspecified: Secondary | ICD-10-CM

## 2020-05-13 DIAGNOSIS — J9811 Atelectasis: Secondary | ICD-10-CM | POA: Diagnosis not present

## 2020-05-13 DIAGNOSIS — R079 Chest pain, unspecified: Secondary | ICD-10-CM | POA: Diagnosis not present

## 2020-05-13 DIAGNOSIS — M797 Fibromyalgia: Secondary | ICD-10-CM | POA: Diagnosis present

## 2020-05-13 DIAGNOSIS — Z8541 Personal history of malignant neoplasm of cervix uteri: Secondary | ICD-10-CM | POA: Diagnosis not present

## 2020-05-13 DIAGNOSIS — Z885 Allergy status to narcotic agent status: Secondary | ICD-10-CM

## 2020-05-13 DIAGNOSIS — Z20822 Contact with and (suspected) exposure to covid-19: Secondary | ICD-10-CM | POA: Diagnosis not present

## 2020-05-13 DIAGNOSIS — Z79891 Long term (current) use of opiate analgesic: Secondary | ICD-10-CM

## 2020-05-13 DIAGNOSIS — D72829 Elevated white blood cell count, unspecified: Secondary | ICD-10-CM | POA: Diagnosis not present

## 2020-05-13 DIAGNOSIS — J189 Pneumonia, unspecified organism: Secondary | ICD-10-CM | POA: Diagnosis not present

## 2020-05-13 DIAGNOSIS — M549 Dorsalgia, unspecified: Secondary | ICD-10-CM

## 2020-05-13 DIAGNOSIS — R112 Nausea with vomiting, unspecified: Secondary | ICD-10-CM | POA: Diagnosis not present

## 2020-05-13 DIAGNOSIS — R109 Unspecified abdominal pain: Secondary | ICD-10-CM | POA: Diagnosis not present

## 2020-05-13 DIAGNOSIS — I700 Atherosclerosis of aorta: Secondary | ICD-10-CM | POA: Diagnosis not present

## 2020-05-13 LAB — LIPASE, BLOOD: Lipase: 26 U/L (ref 11–51)

## 2020-05-13 LAB — CBC
HCT: 33.1 % — ABNORMAL LOW (ref 36.0–46.0)
Hemoglobin: 10.7 g/dL — ABNORMAL LOW (ref 12.0–15.0)
MCH: 30.3 pg (ref 26.0–34.0)
MCHC: 32.3 g/dL (ref 30.0–36.0)
MCV: 93.8 fL (ref 80.0–100.0)
Platelets: 371 10*3/uL (ref 150–400)
RBC: 3.53 MIL/uL — ABNORMAL LOW (ref 3.87–5.11)
RDW: 15.4 % (ref 11.5–15.5)
WBC: 12 10*3/uL — ABNORMAL HIGH (ref 4.0–10.5)
nRBC: 0 % (ref 0.0–0.2)

## 2020-05-13 LAB — COMPREHENSIVE METABOLIC PANEL
ALT: 8 U/L (ref 0–44)
AST: 10 U/L — ABNORMAL LOW (ref 15–41)
Albumin: 3.3 g/dL — ABNORMAL LOW (ref 3.5–5.0)
Alkaline Phosphatase: 72 U/L (ref 38–126)
Anion gap: 8 (ref 5–15)
BUN: 30 mg/dL — ABNORMAL HIGH (ref 6–20)
CO2: 19 mmol/L — ABNORMAL LOW (ref 22–32)
Calcium: 8.9 mg/dL (ref 8.9–10.3)
Chloride: 112 mmol/L — ABNORMAL HIGH (ref 98–111)
Creatinine, Ser: 2.12 mg/dL — ABNORMAL HIGH (ref 0.44–1.00)
GFR calc Af Amer: 29 mL/min — ABNORMAL LOW (ref >60)
GFR calc non Af Amer: 25 mL/min — ABNORMAL LOW (ref >60)
Glucose, Bld: 126 mg/dL — ABNORMAL HIGH (ref 70–99)
Potassium: 5.1 mmol/L (ref 3.5–5.1)
Sodium: 139 mmol/L (ref 135–145)
Total Bilirubin: 1.9 mg/dL — ABNORMAL HIGH (ref 0.3–1.2)
Total Protein: 6.9 g/dL (ref 6.5–8.1)

## 2020-05-13 LAB — TROPONIN I (HIGH SENSITIVITY)
Troponin I (High Sensitivity): 4 ng/L (ref <18)
Troponin I (High Sensitivity): 7 ng/L (ref <18)

## 2020-05-13 LAB — LACTIC ACID, PLASMA: Lactic Acid, Venous: 1.5 mmol/L (ref 0.5–1.9)

## 2020-05-13 MED ORDER — SODIUM CHLORIDE 0.9 % IV SOLN
2.0000 g | INTRAVENOUS | Status: DC
  Administered 2020-05-13 – 2020-05-15 (×3): 2 g via INTRAVENOUS
  Filled 2020-05-13 (×2): qty 20
  Filled 2020-05-13: qty 2
  Filled 2020-05-13: qty 20

## 2020-05-13 MED ORDER — SODIUM CHLORIDE 0.9 % IV SOLN
100.0000 mg | Freq: Once | INTRAVENOUS | Status: AC
  Administered 2020-05-14: 100 mg via INTRAVENOUS
  Filled 2020-05-13: qty 100

## 2020-05-13 MED ORDER — SODIUM CHLORIDE 0.9% FLUSH: 3.0000 mL | Freq: Once | INTRAVENOUS | Status: DC

## 2020-05-13 MED ORDER — SODIUM CHLORIDE 0.9 % IV BOLUS
1000.0000 mL | Freq: Once | INTRAVENOUS | Status: AC
  Administered 2020-05-13: 1000 mL via INTRAVENOUS

## 2020-05-13 NOTE — ED Notes (Signed)
Pt states having abdominal pain that radiates towards her back. Patient states having nvd, denies fevers.

## 2020-05-13 NOTE — H&P (Signed)
History and Physical    Whitney Marks XIP:[id number] DOB: 04-08-1961 DOA: 05/13/2020   PCP: [facility]   Patient coming from: Home  Chief Complaint:  Abd pain / chest / back pain.  HPI: Whitney Marks is a 59 y.o. female with medical history significant of  Upper abdominal pain and back pain and chest pain that started yesterday morning whole sleeping, has nausea and vomiting. Pt has been immunized on may 26th for her covid-19 vaccines.pt reports back pain and sob and hurts her chest to breath. acute onset yesterday mainly epigastric and in the left upper abdomen, associated with nausea and dry heaving, and today radiating up towards the left chest and breast.  She denies any diarrhea, fever or chills, or urinary symptoms.  She has no significant shortness of breath.  Pt is alert,awake and speaking clearly and able to complete sentences and no sob.  States she has c/h back issues and would like something for her back pain. ED Course:  Blood pressure (!) 97/59, pulse 82, temperature 98.6 F (37 C), temperature source Oral, resp. rate 17, height 5\' 2"  (1.575 m), weight 61.2 kg, SpO2 97 %. labs show wbc count of 11 and hb of 10.7/ cmp shows creatinine of 2.12 . CHX showed basilar infiltrate and ct abd showed infection c/w pneumonia.   Review of Systems: As per HPI otherwise 10 point review of systems negative.    Past Medical History:  Diagnosis Date  . AKI (acute kidney injury) ([place])   . Anxiety   . Arthritis   . 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[place])   . Chronic pain   . Depression   . Difficult intubation   . Fibromyalgia   . Headache(784.0)   . Neuromuscular disorder ([place])   . Pneumonia   . Recurrent upper respiratory infection (URI)   . Shortness of breath   . Sleep apnea     Past Surgical History:  Procedure Laterality Date  . LARYNGOSCOPY N/A 01/24/2014   Procedure: MICRO LARYNGOSCOPY;  Surgeon: Melida Quitter, MD;  Location: [facility];  Service: ENT;   Laterality: N/A;  MICRO DIRECT LARYNGOSCOPY  . NECK SURGERY    . TRACHEOSTOMY       reports that she has never smoked. She has never used smokeless tobacco. She reports current alcohol use. She reports that she does not use drugs.  Allergies  Allergen Reactions  . Lyrica [Pregabalin] Other (See Comments)    Couldn't walk  . Other Itching  . Pentazocine Lactate Other (See Comments)    hallucinates  . Ranitidine Hcl Other (See Comments)    blisters  . Tobramycin Hives  . Zofran [Ondansetron Hcl] Swelling  . Ranitidine Other (See Comments)    Unknown reaction  . Azithromycin Itching  . Codeine Itching  . Reglan [Metoclopramide] Rash    Family History  Problem Relation Age of Onset  . CVA Father     Prior to Admission medications   Medication Sig Start Date End Date Taking? Authorizing Provider  baclofen (LIORESAL) 20 MG tablet Take 20 mg by mouth 3 (three) times daily as needed. 05/23/19   [provider]  cyclobenzaprine (FLEXERIL) 10 MG tablet Take 10 mg by mouth 2 (two) times daily. 05/07/19   [provider]  lidocaine (LIDODERM) 5 % Place 1 patch onto the skin daily. 06/25/19   [provider]  oxyCODONE-acetaminophen (PERCOCET/ROXICET) 5-325 MG tablet Take 1 tablet by mouth 2 (two) times daily. 06/17/19   [provider]  pregabalin (LYRICA) 100 MG capsule Take 100 mg by mouth daily.    [provider]  QUEtiapine (SEROQUEL) 25 MG tablet Take 1 tablet (25 mg total) by mouth at bedtime. 07/05/19   Patrecia Pour, NP  tiZANidine (ZANAFLEX) 4 MG tablet Take 4 mg by mouth 4 (four) times daily. 06/25/19   [provider]    Physical Exam: Vitals:   05/13/20 1711 05/13/20 2114 05/13/20 2228 05/13/20 2254  BP:  (!) 93/50 (!) 124/93 (!) 97/59  Pulse:  64 66 82  Resp:  16 19 17   Temp:      TempSrc:      SpO2:  91% 95% 97%  Weight: 61.2 kg     Height: 5\' 2"  (1.575 m)        Constitutional: NAD, calm, comfortable Vitals:    05/13/20 1711 05/13/20 2114 05/13/20 2228 05/13/20 2254  BP:  (!) 93/50 (!) 124/93 (!) 97/59  Pulse:  64 66 82  Resp:  16 19 17   Temp:      TempSrc:      SpO2:  91% 95% 97%  Weight: 61.2 kg     Height: 5\' 2"  (1.575 m)      Eyes: PERRL, lids and conjunctivae normal ENMT: Mucous membranes are moist. Posterior pharynx clear of any exudate or lesions.Normal dentition.  Neck: normal, supple, no masses, no thyromegaly, trach +. Respiratory: clear to auscultation bilaterally, no wheezing, no crackles. Normal respiratory effort. No accessory muscle use.  Cardiovascular: Regular rate and rhythm, no murmurs / rubs / gallops. No extremity edema. 2+ pedal pulses. No carotid bruits.  Abdomen: no tenderness, no masses palpated. No hepatosplenomegaly. Bowel sounds positive.  Musculoskeletal: no clubbing / cyanosis. No joint deformity upper and lower extremities. Good ROM, no contractures. Normal muscle tone.  Skin: no rashes, lesions, ulcers. No induration Neurologic: CN 2-12 grossly intact.  Psychiatric: Normal judgment and insight. Alert and oriented x 3. Normal mood.   Labs on Admission: I have personally reviewed following labs and imaging studies  CBC: Recent Labs  Lab 05/13/20 1712  WBC 12.0*  HGB 10.7*  HCT 33.1*  MCV 93.8  PLT 725   Basic Metabolic Panel: Recent Labs  Lab 05/13/20 1712  NA 139  K 5.1  CL 112*  CO2 19*  GLUCOSE 126*  BUN 30*  CREATININE 2.12*  CALCIUM 8.9   GFR: Estimated Creatinine Clearance: 24.9 mL/min (A) (by C-G formula based on SCr of 2.12 mg/dL (H)). Liver Function Tests: Recent Labs  Lab 05/13/20 1712  AST 10*  ALT 8  ALKPHOS 72  BILITOT 1.9*  PROT 6.9  ALBUMIN 3.3*   Recent Labs  Lab 05/13/20 1712  LIPASE 26   Urine analysis:    Component Value Date/Time   COLORURINE STRAW (A) 10/28/2018 2353   APPEARANCEUR CLEAR 10/28/2018 2353   LABSPEC 1.020 10/28/2018 2353   PHURINE 6.0 10/28/2018 2353   GLUCOSEU NEGATIVE 10/28/2018 2353    HGBUR NEGATIVE 10/28/2018 2353   BILIRUBINUR NEGATIVE 10/28/2018 2353   KETONESUR NEGATIVE 10/28/2018 2353   PROTEINUR NEGATIVE 10/28/2018 2353   UROBILINOGEN 0.2 01/09/2014 2212   NITRITE NEGATIVE 10/28/2018 2353   LEUKOCYTESUR NEGATIVE 10/28/2018 2353    Radiological Exams on Admission: CT ABDOMEN PELVIS WO CONTRAST  Result Date: 05/13/2020 CLINICAL DATA:  Abdominal pain with nausea and vomit EXAM: CT ABDOMEN AND PELVIS WITHOUT CONTRAST TECHNIQUE: Multidetector CT imaging of the abdomen and pelvis was performed following the standard protocol without IV contrast. COMPARISON:  May 25, 2016 FINDINGS: Lower chest: The visualized heart size within normal limits. No pericardial fluid/thickening. There is a small hiatal hernia. Multifocal patchy ground-glass opacities with tree-in-bud opacities are seen at both lung bases. Hepatobiliary: Although limited due to the lack of intravenous contrast, normal in appearance without gross focal abnormality. No evidence of calcified gallstones or biliary ductal dilatation. Pancreas:  Unremarkable.  No surrounding inflammatory changes. Spleen: Normal in size. Although limited due to the lack of intravenous contrast, normal in appearance. Adrenals/Urinary Tract: Both adrenal glands appear normal. The kidneys and collecting system appear normal without evidence of urinary tract calculus or hydronephrosis. Bladder is unremarkable. Stomach/Bowel: The stomach, small bowel, and colon are normal in appearance. No inflammatory changes or obstructive findings. appendix is normal. Vascular/Lymphatic: There are no enlarged abdominal or pelvic lymph nodes. Mild scattered aorto bi iliac atherosclerosis is seen. Reproductive: The uterus and adnexa are unremarkable. Other: No evidence of abdominal wall mass or hernia. Musculoskeletal: No acute or significant osseous findings. IMPRESSION: Multifocal patchy ground-glass opacities are seen at both lung bases, concerning for  multifocal pneumonia. No acute intra-abdominal or pelvic pathology to explain the patient's symptoms. Aortic Atherosclerosis (ICD10-I70.0). Electronically Signed   By: Prudencio Pair M.D.   On: 05/13/2020 22:18   DG Chest 2 View  Result Date: 05/13/2020 CLINICAL DATA:  Chest pain EXAM: CHEST - 2 VIEW COMPARISON:  July 02, 2019 FINDINGS: There is mild cardiomegaly. Tracheostomy tube tip seen at the level of the clavicular heads. Mildly increased interstitial markings seen at both lung bases which may be due to atelectasis and/chronic lung changes. No large airspace consolidation or effusion. The visualized skeletal structures are unremarkable. IMPRESSION: Mildly increased interstitial markings seen at both lung bases, likely due to atelectasis and/or chronic lung changes. Electronically Signed   By: Prudencio Pair M.D.   On: 05/13/2020 17:48    EKG: Independently reviewed. Sinus rhythm @ 66. Assessment/Plan Chest Pain/ Lower back pain and upper abdominal pain: attributed to her bl pneumonia.  We will continue her on iv antibiotics.  IS/ Supplemental oxygen as needed. Covid-19 results are pending.   AKI: -cont ivf hydration -repeat s.cr in am and avoid contrast.   C/H back pain:  Pt reports that she has severe back pain and we will start prn morphine.  [facility] sen [facility] .  DVT prophylaxis: Heparin.   DVT prophylaxis: Heparin Code Status: Full  Family Communication: Mohammed Kindle [phone] Disposition Plan: Home Consults called:None Admission status: Inpatient.   Para Skeans MD [facility] If 7PM-7AM, please contact night-coverage [email or web address] Password [facility] 05/13/2020, 11:39 PM

## 2020-05-13 NOTE — ED Notes (Addendum)
This RN called to X-ray due to patient having "seizure like activity". Pt with no LOC, remembers entire event. Pt states became slightly short of breath but feels better. Pt denies seizure like activity. Pt alert and speaking upon this RN arrival to X-ray. Pt noted to be falling asleep while X-ray being performed. First RN made aware. Pt also left purse in triage on way to X-ray. Tiffany, First RN given patient's purse, of note, on top of patient's open purse, Tizanidine noted on top of patient's belongings. Tiffany, First RN made aware.

## 2020-05-13 NOTE — ED Provider Notes (Signed)
[facility] Emergency Department Provider Note ____________________________________________   First MD Initiated Contact with Patient 05/13/20 2136     (approximate)  I have reviewed the triage vital signs and the nursing notes.   HISTORY  Chief Complaint Chest Pain and Abdominal Pain    HPI Whitney Marks is a 59 y.o. female with PMH as noted below who presents with abdominal pain, acute onset yesterday mainly epigastric and in the left upper abdomen, associated with nausea and dry heaving, and today radiating up towards the left chest and breast.  She denies any diarrhea, fever or chills, or urinary symptoms.  She has no significant shortness of breath.  Past Medical History:  Diagnosis Date  . AKI (acute kidney injury) ([place])   . Anxiety   . Arthritis   . 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[place])   . Chronic pain   . Depression   . Difficult intubation   . Fibromyalgia   . Headache(784.0)   . Neuromuscular disorder ([place])   . Pneumonia   . Recurrent upper respiratory infection (URI)   . Shortness of breath   . Sleep apnea     Patient Active Problem List   Diagnosis Date Noted  . Polysubstance (including opioids) dependence with physiol dependence ([place]) 07/05/2019  . Acute psychosis ([place]) 07/05/2019  . Tracheostomy present ([place])   . Somnolence   . Acute respiratory failure with hypoxia ([place]) 01/27/2015  . Hypoglycemia 01/27/2015  . Hypoxic 01/27/2015  . Acute on chronic respiratory failure with hypoxia ([place])   . Altered mental status   . AKI (acute kidney injury) ([place])   . Respiratory acidosis   . Tracheostomy dependence ([place])   . Hypotensive episode   . Septic shock(785.52) 01/11/2014  . Acute respiratory failure ([place]) 01/09/2014  . Pneumonia 01/09/2014  . ARDS (adult respiratory distress syndrome) ([place]) 10/30/2011  . CAP (community acquired pneumonia) 10/30/2011  . Aspiration pneumonia ([place]) 10/30/2011  . Encephalopathy acute 10/30/2011  .  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 Severe sepsis(995.92) 10/30/2011    Past Surgical History:  Procedure Laterality Date  . LARYNGOSCOPY N/A 01/24/2014   Procedure: MICRO LARYNGOSCOPY;  Surgeon: Melida Quitter, MD;  Location: [facility];  Service: ENT;  Laterality: N/A;  MICRO DIRECT LARYNGOSCOPY  . NECK SURGERY    . TRACHEOSTOMY      Prior to Admission medications   Medication Sig Start Date End Date Taking? Authorizing Provider  baclofen (LIORESAL) 20 MG tablet Take 20 mg by mouth 3 (three) times daily as needed. 05/23/19   [provider]  cyclobenzaprine (FLEXERIL) 10 MG tablet Take 10 mg by mouth 2 (two) times daily. 05/07/19   [provider]  lidocaine (LIDODERM) 5 % Place 1 patch onto the skin daily. 06/25/19   [provider]  oxyCODONE-acetaminophen (PERCOCET/ROXICET) 5-325 MG tablet Take 1 tablet by mouth 2 (two) times daily. 06/17/19   [provider]  pregabalin (LYRICA) 100 MG capsule Take 100 mg by mouth daily.    [provider]  QUEtiapine (SEROQUEL) 25 MG tablet Take 1 tablet (25 mg total) by mouth at bedtime. 07/05/19   Patrecia Pour, NP  tiZANidine (ZANAFLEX) 4 MG tablet Take 4 mg by mouth 4 (four) times daily. 06/25/19   [provider]    Allergies Lyrica [pregabalin], Other, Pentazocine lactate, Ranitidine hcl, Tobramycin, Zofran [ondansetron hcl], Ranitidine, Azithromycin, Codeine, and Reglan [metoclopramide]  No family history on file.  Social History Social History   Tobacco Use  . Smoking status: Never Smoker  . Smokeless tobacco: Never Used  Substance Use Topics  . Alcohol use: Yes    Comment: occ  . Drug use: No    Review of Systems  Constitutional: No fever/chills. Eyes: No redness. ENT: No sore throat. Cardiovascular: Positive for chest pain. Respiratory: Denies shortness of breath. Gastrointestinal: Positive for  nausea. Genitourinary: Negative for dysuria or frequency.  Musculoskeletal: Negative for back pain. Skin: Negative for rash. Neurological: Negative for headache.   ____________________________________________   PHYSICAL EXAM:  VITAL SIGNS: ED Triage Vitals  Enc Vitals Group     BP 05/13/20 1707 104/68     Pulse Rate 05/13/20 1707 62     Resp 05/13/20 1707 (!) 24     Temp 05/13/20 1707 98.6 F (37 C)     Temp Source 05/13/20 1707 Oral     SpO2 05/13/20 1707 96 %     Weight 05/13/20 1711 135 lb (61.2 kg)     Height 05/13/20 1711 5\' 2"  (1.575 m)     Head Circumference --      Peak Flow --      Pain Score 05/13/20 1710 10     Pain Loc --      Pain Edu? --      Excl. in [place]? --     Constitutional: Alert and oriented.  Somewhat chronically ill-appearing but in no acute distress. Eyes: Conjunctivae are normal.  No scleral icterus Head: Atraumatic. Nose: No congestion/rhinnorhea. Mouth/Throat: Mucous membranes are moist.   Neck: Normal range of motion.  Tracheostomy present. Cardiovascular: Normal rate, regular rhythm.   Good peripheral circulation. Respiratory: Normal respiratory effort.  No retractions. Gastrointestinal: Soft with mild epigastric discomfort but no focal tenderness or peritoneal signs.  No distention.  Genitourinary: No flank tenderness. Musculoskeletal: Extremities warm and well perfused.  Neurologic:  Normal speech and language. No gross focal neurologic deficits are appreciated.  Skin:  Skin is warm and dry. No rash noted. Psychiatric: Mood and affect are normal. Speech and behavior are normal.  ____________________________________________   LABS (all labs ordered are listed, but only abnormal results are displayed)  Labs Reviewed  CBC - Abnormal; Notable for the following components:      Result Value   WBC 12.0 (*)    RBC 3.53 (*)    Hemoglobin 10.7 (*)    HCT 33.1 (*)    All other components within normal limits  COMPREHENSIVE METABOLIC PANEL  - Abnormal; Notable for the following components:   Chloride 112 (*)    CO2 19 (*)    Glucose, Bld 126 (*)    BUN 30 (*)    Creatinine, Ser 2.12 (*)    Albumin 3.3 (*)    AST 10 (*)    Total Bilirubin 1.9 (*)    GFR calc non Af Amer 25 (*)    GFR calc Af Amer 29 (*)    All other components within normal limits  SARS CORONAVIRUS 2 BY RT PCR (HOSPITAL ORDER, [facility])  LIPASE, BLOOD  LACTIC ACID, PLASMA  URINALYSIS, COMPLETE (UACMP) WITH MICROSCOPIC  LACTIC ACID, PLASMA  TROPONIN I (HIGH SENSITIVITY)  TROPONIN I (HIGH SENSITIVITY)   ____________________________________________  EKG  ED ECG REPORT I, Arta Silence, the attending physician, personally viewed and interpreted this ECG.  Date: 05/13/2020 EKG Time: 1703 Rate: 66 Rhythm: normal sinus rhythm QRS Axis: normal Intervals: normal ST/T Wave abnormalities: normal  Narrative Interpretation: no evidence of acute ischemia  ____________________________________________  RADIOLOGY  CXR: No focal infiltrate or edema.  Bibasilar atelectasis. CT abdomen: No acute intra-abdominal findings.  Findings in the lung bases concerning for multifocal pneumonia. ____________________________________________   PROCEDURES  Procedure(s) performed: No  Procedures  Critical Care performed: No ____________________________________________   INITIAL IMPRESSION / ASSESSMENT AND PLAN / ED COURSE  Pertinent labs & imaging results that were available during my care of the patient were reviewed by me and considered in my medical decision making (see chart for details).  59 year old female with PMH as noted above presents with epigastric and left upper quadrant abdominal pain since yesterday which subsequently radiated to the chest.  She has had associated nausea but no vomiting.  I reviewed the past medical records in [place].  The patient was most recently evaluated in the ED last September due to auditory  visual hallucinations and was placed under involuntary commitment, then subsequently cleared by psychiatry.  The RN today during triage noted that the patient was somewhat lethargic and falling asleep frequently, however she is fully alert and oriented during my evaluation.  On exam, the patient is comfortable appearing.  Her vital signs are normal except for borderline hypotension.  The abdomen is soft with no focal tenderness.  Physical exam is otherwise unremarkable for acute findings.  EKG is nonischemic.  Differential includes gastritis, PUD, pancreatitis, other hepatobiliary etiology, colitis or diverticulitis, UTI/pyelonephritis, kidney stone, or less likely ACS.  There is no clinical evidence for PE.  Initial lab work-up reveals leukocytosis and a negative troponin.  The creatinine is 2.1 which is increased from the last labs we have several months ago, however the patient states that a few months ago she was told that she had chronic kidney disease.  I do not see any prior records involving this encounter.  We will obtain a second troponin, lactate, UA, and a CT of the abdomen.  ----------------------------------------- 11:13 PM on 05/13/2020 -----------------------------------------  CT shows no significant acute intra-abdominal findings, but is suggestive of multifocal pneumonia in the lower lungs.  The patient has been vaccinated for COVID-19 however, we have ordered a COVID-19 swab.  She states that she has had pneumonia frequently in the past, and does endorse increased pulmonary secretions and a chronic cough.  Given her borderline low blood pressure, the multifocal opacities, and her chronic conditions, I will admit for IV antibiotics.    I discussed the case with the hospitalist Dr. Posey Pronto for admission.   ____________________________________________   FINAL CLINICAL IMPRESSION(S) / ED DIAGNOSES  Final diagnoses:  Community acquired pneumonia, unspecified laterality       NEW MEDICATIONS STARTED DURING THIS VISIT:  New Prescriptions   No medications on file     Note:  This document was prepared using Dragon voice recognition software and may include unintentional dictation errors.    Arta Silence, MD 05/13/20 2315

## 2020-05-13 NOTE — ED Notes (Signed)
Pt noted to be very lethargic during triage, falling asleep while this RN drawing blood and needing frequent reminders to stay awake and stimulation. Pt awakens easily, then noted to fall back asleep while this RN drawing blood.

## 2020-05-13 NOTE — ED Notes (Signed)
This RN attempted IV access x 2.   ?

## 2020-05-13 NOTE — ED Triage Notes (Signed)
Pt presents to ED via POV with c/o abdominal N/V that started yesterday, states contacted PCP and was prescribed nausea medication and antacid. Pt states woke up this morning with a "squeezing" sensation to L breast, pt states pain radiates through to her back. Pt states feels like a heart attack.  Pt with trach noted, on RA. Pt noted to be pale in triage.

## 2020-05-14 DIAGNOSIS — M549 Dorsalgia, unspecified: Secondary | ICD-10-CM | POA: Diagnosis not present

## 2020-05-14 DIAGNOSIS — F1120 Opioid dependence, uncomplicated: Secondary | ICD-10-CM

## 2020-05-14 DIAGNOSIS — Z885 Allergy status to narcotic agent status: Secondary | ICD-10-CM | POA: Diagnosis not present

## 2020-05-14 DIAGNOSIS — F329 Major depressive disorder, single episode, unspecified: Secondary | ICD-10-CM | POA: Diagnosis present

## 2020-05-14 DIAGNOSIS — F419 Anxiety disorder, unspecified: Secondary | ICD-10-CM | POA: Diagnosis present

## 2020-05-14 DIAGNOSIS — E860 Dehydration: Secondary | ICD-10-CM | POA: Diagnosis present

## 2020-05-14 DIAGNOSIS — M797 Fibromyalgia: Secondary | ICD-10-CM | POA: Diagnosis present

## 2020-05-14 DIAGNOSIS — Z930 Tracheostomy status: Secondary | ICD-10-CM | POA: Diagnosis not present

## 2020-05-14 DIAGNOSIS — G8929 Other chronic pain: Secondary | ICD-10-CM | POA: Diagnosis present

## 2020-05-14 DIAGNOSIS — Z888 Allergy status to other drugs, medicaments and biological substances status: Secondary | ICD-10-CM | POA: Diagnosis not present

## 2020-05-14 DIAGNOSIS — Z79891 Long term (current) use of opiate analgesic: Secondary | ICD-10-CM | POA: Diagnosis not present

## 2020-05-14 DIAGNOSIS — N179 Acute kidney failure, unspecified: Secondary | ICD-10-CM | POA: Diagnosis present

## 2020-05-14 DIAGNOSIS — D72829 Elevated white blood cell count, unspecified: Secondary | ICD-10-CM

## 2020-05-14 DIAGNOSIS — Z8541 Personal history of malignant neoplasm of cervix uteri: Secondary | ICD-10-CM | POA: Diagnosis not present

## 2020-05-14 DIAGNOSIS — Z8701 Personal history of pneumonia (recurrent): Secondary | ICD-10-CM | POA: Diagnosis not present

## 2020-05-14 DIAGNOSIS — J189 Pneumonia, unspecified organism: Principal | ICD-10-CM

## 2020-05-14 DIAGNOSIS — Z20822 Contact with and (suspected) exposure to covid-19: Secondary | ICD-10-CM | POA: Diagnosis present

## 2020-05-14 DIAGNOSIS — Z79899 Other long term (current) drug therapy: Secondary | ICD-10-CM | POA: Diagnosis not present

## 2020-05-14 LAB — URINALYSIS, COMPLETE (UACMP) WITH MICROSCOPIC
Bilirubin Urine: NEGATIVE
Glucose, UA: NEGATIVE mg/dL
Hgb urine dipstick: NEGATIVE
Ketones, ur: NEGATIVE mg/dL
Nitrite: NEGATIVE
Protein, ur: NEGATIVE mg/dL
Specific Gravity, Urine: 1.017 (ref 1.005–1.030)
pH: 5 (ref 5.0–8.0)

## 2020-05-14 LAB — CBC
HCT: 29.3 % — ABNORMAL LOW (ref 36.0–46.0)
Hemoglobin: 9.7 g/dL — ABNORMAL LOW (ref 12.0–15.0)
MCH: 30 pg (ref 26.0–34.0)
MCHC: 33.1 g/dL (ref 30.0–36.0)
MCV: 90.7 fL (ref 80.0–100.0)
Platelets: 306 10*3/uL (ref 150–400)
RBC: 3.23 MIL/uL — ABNORMAL LOW (ref 3.87–5.11)
RDW: 15.7 % — ABNORMAL HIGH (ref 11.5–15.5)
WBC: 17 10*3/uL — ABNORMAL HIGH (ref 4.0–10.5)
nRBC: 0 % (ref 0.0–0.2)

## 2020-05-14 LAB — HIV ANTIBODY (ROUTINE TESTING W REFLEX): HIV Screen 4th Generation wRfx: NONREACTIVE

## 2020-05-14 LAB — COMPREHENSIVE METABOLIC PANEL
ALT: 10 U/L (ref 0–44)
AST: 9 U/L — ABNORMAL LOW (ref 15–41)
Albumin: 2.8 g/dL — ABNORMAL LOW (ref 3.5–5.0)
Alkaline Phosphatase: 63 U/L (ref 38–126)
Anion gap: 9 (ref 5–15)
BUN: 39 mg/dL — ABNORMAL HIGH (ref 6–20)
CO2: 17 mmol/L — ABNORMAL LOW (ref 22–32)
Calcium: 8.3 mg/dL — ABNORMAL LOW (ref 8.9–10.3)
Chloride: 116 mmol/L — ABNORMAL HIGH (ref 98–111)
Creatinine, Ser: 2.14 mg/dL — ABNORMAL HIGH (ref 0.44–1.00)
GFR calc Af Amer: 29 mL/min — ABNORMAL LOW (ref >60)
GFR calc non Af Amer: 25 mL/min — ABNORMAL LOW (ref >60)
Glucose, Bld: 151 mg/dL — ABNORMAL HIGH (ref 70–99)
Potassium: 4.3 mmol/L (ref 3.5–5.1)
Sodium: 142 mmol/L (ref 135–145)
Total Bilirubin: 1.2 mg/dL (ref 0.3–1.2)
Total Protein: 6.3 g/dL — ABNORMAL LOW (ref 6.5–8.1)

## 2020-05-14 LAB — SARS CORONAVIRUS 2 BY RT PCR (HOSPITAL ORDER, PERFORMED IN ~~LOC~~ HOSPITAL LAB): SARS Coronavirus 2: NEGATIVE

## 2020-05-14 LAB — PROCALCITONIN: Procalcitonin: 1.41 ng/mL

## 2020-05-14 MED ORDER — BACLOFEN 10 MG PO TABS
20.0000 mg | ORAL_TABLET | Freq: Three times a day (TID) | ORAL | Status: DC | PRN
  Filled 2020-05-14: qty 2

## 2020-05-14 MED ORDER — HEPARIN SODIUM (PORCINE) 5000 UNIT/ML IJ SOLN
5000.0000 [IU] | Freq: Three times a day (TID) | INTRAMUSCULAR | Status: DC
  Administered 2020-05-14 – 2020-05-16 (×7): 5000 [IU] via SUBCUTANEOUS
  Filled 2020-05-14 (×7): qty 1

## 2020-05-14 MED ORDER — SODIUM CHLORIDE 0.9 % IV SOLN
100.0000 mg | Freq: Once | INTRAVENOUS | Status: DC
  Administered 2020-05-14: 100 mg via INTRAVENOUS
  Filled 2020-05-14: qty 100

## 2020-05-14 MED ORDER — TIZANIDINE HCL 4 MG PO TABS
4.0000 mg | ORAL_TABLET | Freq: Four times a day (QID) | ORAL | Status: DC
  Administered 2020-05-14 – 2020-05-16 (×8): 4 mg via ORAL
  Filled 2020-05-14 (×14): qty 1

## 2020-05-14 MED ORDER — LIDOCAINE 5 % EX PTCH
1.0000 | MEDICATED_PATCH | Freq: Every day | CUTANEOUS | Status: DC
  Administered 2020-05-14 – 2020-05-16 (×3): 1 via TRANSDERMAL
  Filled 2020-05-14 (×3): qty 1

## 2020-05-14 MED ORDER — ACETAMINOPHEN 325 MG PO TABS
650.0000 mg | ORAL_TABLET | Freq: Four times a day (QID) | ORAL | Status: DC | PRN
  Administered 2020-05-15: 17:00:00 650 mg via ORAL
  Filled 2020-05-14: qty 2

## 2020-05-14 MED ORDER — BUTALBITAL-APAP-CAFFEINE 50-325-40 MG PO TABS: 1.0000 | ORAL_TABLET | ORAL | Status: DC

## 2020-05-14 MED ORDER — OXYCODONE-ACETAMINOPHEN 7.5-325 MG PO TABS: 1.0000 | ORAL_TABLET | Freq: Four times a day (QID) | ORAL | Status: DC | PRN

## 2020-05-14 MED ORDER — SODIUM CHLORIDE 0.9% FLUSH: 3.0000 mL | INTRAVENOUS | Status: DC | PRN

## 2020-05-14 MED ORDER — GABAPENTIN 300 MG PO CAPS
300.0000 mg | ORAL_CAPSULE | Freq: Three times a day (TID) | ORAL | Status: DC
  Administered 2020-05-14 – 2020-05-16 (×6): 300 mg via ORAL
  Filled 2020-05-14 (×6): qty 1

## 2020-05-14 MED ORDER — TRAZODONE HCL 50 MG PO TABS
100.0000 mg | ORAL_TABLET | Freq: Every day | ORAL | Status: DC
  Administered 2020-05-14 – 2020-05-15 (×2): 100 mg via ORAL
  Filled 2020-05-14: qty 2
  Filled 2020-05-14: qty 1
  Filled 2020-05-14: qty 2

## 2020-05-14 MED ORDER — SODIUM CHLORIDE 0.9 % IV SOLN
100.0000 mg | Freq: Two times a day (BID) | INTRAVENOUS | Status: DC
  Filled 2020-05-14 (×4): qty 100

## 2020-05-14 MED ORDER — ALBUTEROL SULFATE (2.5 MG/3ML) 0.083% IN NEBU: 2.5000 mg | INHALATION_SOLUTION | Freq: Four times a day (QID) | RESPIRATORY_TRACT | Status: DC | PRN

## 2020-05-14 MED ORDER — ACETAMINOPHEN 650 MG RE SUPP: 650.0000 mg | Freq: Four times a day (QID) | RECTAL | Status: DC | PRN

## 2020-05-14 MED ORDER — OXYCODONE-ACETAMINOPHEN 7.5-325 MG PO TABS
1.0000 | ORAL_TABLET | Freq: Three times a day (TID) | ORAL | Status: DC | PRN
  Administered 2020-05-14 – 2020-05-16 (×4): 1 via ORAL
  Filled 2020-05-14 (×4): qty 1

## 2020-05-14 MED ORDER — SODIUM CHLORIDE 0.9 % IV SOLN: INTRAVENOUS | Status: AC

## 2020-05-14 MED ORDER — SODIUM CHLORIDE 0.9% FLUSH
3.0000 mL | Freq: Two times a day (BID) | INTRAVENOUS | Status: DC
  Administered 2020-05-14 – 2020-05-16 (×4): 3 mL via INTRAVENOUS

## 2020-05-14 MED ORDER — ESTRADIOL 0.1 MG/GM VA CREA
1.0000 | TOPICAL_CREAM | VAGINAL | Status: DC
  Filled 2020-05-14: qty 42.5

## 2020-05-14 MED ORDER — ONDANSETRON HCL 4 MG PO TABS
4.0000 mg | ORAL_TABLET | Freq: Three times a day (TID) | ORAL | Status: DC | PRN
  Administered 2020-05-15: 4 mg via ORAL
  Filled 2020-05-14: qty 1

## 2020-05-14 MED ORDER — SODIUM CHLORIDE 0.9 % IV SOLN
250.0000 mL | INTRAVENOUS | Status: DC | PRN
  Administered 2020-05-15: 250 mL via INTRAVENOUS

## 2020-05-14 MED ORDER — QUETIAPINE FUMARATE 25 MG PO TABS
25.0000 mg | ORAL_TABLET | Freq: Every day | ORAL | Status: DC
  Administered 2020-05-14 – 2020-05-15 (×2): 25 mg via ORAL
  Filled 2020-05-14 (×3): qty 1

## 2020-05-14 MED ORDER — PANTOPRAZOLE SODIUM 20 MG PO TBEC
20.0000 mg | DELAYED_RELEASE_TABLET | Freq: Every day | ORAL | Status: DC
  Administered 2020-05-14 – 2020-05-16 (×3): 20 mg via ORAL
  Filled 2020-05-14 (×3): qty 1

## 2020-05-14 MED ORDER — OXYCODONE-ACETAMINOPHEN 7.5-325 MG PO TABS
1.0000 | ORAL_TABLET | Freq: Three times a day (TID) | ORAL | Status: DC | PRN
  Administered 2020-05-14 (×2): 1 via ORAL
  Filled 2020-05-14 (×3): qty 1

## 2020-05-14 MED ORDER — DOXYCYCLINE HYCLATE 100 MG PO TABS
100.0000 mg | ORAL_TABLET | Freq: Two times a day (BID) | ORAL | Status: DC
  Administered 2020-05-14 – 2020-05-16 (×4): 100 mg via ORAL
  Filled 2020-05-14 (×4): qty 1

## 2020-05-14 MED ORDER — BUPRENORPHINE HCL 2 MG SL SUBL
2.0000 mg | SUBLINGUAL_TABLET | Freq: Every day | SUBLINGUAL | Status: DC
  Filled 2020-05-14: qty 1

## 2020-05-14 NOTE — ED Notes (Signed)
ED TO INPATIENT HANDOFF REPORT  ED Nurse Name and Phone #: Stanton Kidney [phone]  S Name/Age/Gender Whitney Marks 59 y.o. female Room/Bed: ED34A/ED34A  Code Status   Code Status: Full Code  Home/SNF/Other Home Patient oriented to: self, place, time and situation Is this baseline? Yes   Triage Complete: Triage complete  Chief Complaint Pneumonia [J18.9] CAP (community acquired pneumonia) [J18.9]  Triage Note Pt presents to ED via POV with c/o abdominal N/V that started yesterday, states contacted PCP and was prescribed nausea medication and antacid. Pt states woke up this morning with a "squeezing" sensation to L breast, pt states pain radiates through to her back. Pt states feels like a heart attack.  Pt with trach noted, on RA. Pt noted to be pale in triage.     Allergies Allergies  Allergen Reactions   Other Itching   Pentazocine Lactate Other (See Comments)    hallucinates   Ranitidine Hcl Other (See Comments)    blisters   Tobramycin Hives   Zofran [Ondansetron Hcl] Swelling   Ranitidine Other (See Comments)    Unknown reaction   Azithromycin Itching   Codeine Itching   Reglan [Metoclopramide] Rash    Level of Care/Admitting Diagnosis ED Disposition    ED Disposition Condition [facility] [[id number]]  Level of Care: Med-Surg [16]  Covid Evaluation: Confirmed COVID Negative  Diagnosis: CAP (community acquired pneumonia) [762831]  Admitting Physician: Cherylann Ratel  Attending Physician: Odessa Fleming  Estimated length of stay: past midnight tomorrow  Certification:: I certify this patient will need inpatient services for at least 2 midnights       B Medical/Surgery History Past Medical History:  Diagnosis Date   AKI (acute kidney injury) ([place])    Anxiety    Arthritis    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[place])    Chronic pain    Depression    Difficult intubation    Fibromyalgia     Headache(784.0)    Neuromuscular disorder ([place])    Pneumonia    Recurrent upper respiratory infection (URI)    Shortness of breath    Sleep apnea    Past Surgical History:  Procedure Laterality Date   LARYNGOSCOPY N/A 01/24/2014   Procedure: MICRO LARYNGOSCOPY;  Surgeon: Melida Quitter, MD;  Location: [facility];  Service: ENT;  Laterality: N/A;  MICRO DIRECT LARYNGOSCOPY   NECK SURGERY     TRACHEOSTOMY       A IV Location/Drains/Wounds Patient Lines/Drains/Airways Status    Active Line/Drains/Airways    Name Placement date Placement time Site Days   Peripheral IV 05/14/20 Left Foot 05/14/20  0049  Foot  less than 1   Tracheostomy [place] 4 mm Uncuffed 01/24/14  1151  4 mm  2302          Intake/Output Last 24 hours  Intake/Output Summary (Last 24 hours) at 05/14/2020 1545 Last data filed at 05/14/2020 0116 Gross per 24 hour  Intake 1100 ml  Output --  Net 1100 ml    Labs/Imaging Results for orders placed or performed during the hospital encounter of 05/13/20 (from the past 48 hour(s))  CBC     Status: Abnormal   Collection Time: 05/13/20  5:12 PM  Result Value Ref Range   WBC 12.0 (H) 4.0 - 10.5 K/uL   RBC 3.53 (L) 3.87 - 5.11 MIL/uL   Hemoglobin 10.7 (L) 12.0 - 15.0 g/dL   HCT 33.1 (L) 36 - 46 %  MCV 93.8 80.0 - 100.0 fL   MCH 30.3 26.0 - 34.0 pg   MCHC 32.3 30.0 - 36.0 g/dL   RDW 15.4 11.5 - 15.5 %   Platelets 371 150 - 400 K/uL   nRBC 0.0 0.0 - 0.2 %    Comment: Performed at [facility] [address], [place], [place] [address]  Troponin I (High Sensitivity)     Status: None   Collection Time: 05/13/20  5:12 PM  Result Value Ref Range   Troponin I (High Sensitivity) 4 <18 ng/L    Comment: (NOTE) Elevated high sensitivity troponin I (hsTnI) values and significant  changes across serial measurements may suggest ACS but many other  chronic and acute conditions are known to elevate hsTnI results.  Refer to the "Links" section for chest pain  algorithms and additional  guidance. Performed at [facility], [address] [place], [place] [address]   Comprehensive metabolic panel     Status: Abnormal   Collection Time: 05/13/20  5:12 PM  Result Value Ref Range   Sodium 139 135 - 145 mmol/L   Potassium 5.1 3.5 - 5.1 mmol/L   Chloride 112 (H) 98 - 111 mmol/L   CO2 19 (L) 22 - 32 mmol/L   Glucose, Bld 126 (H) 70 - 99 mg/dL    Comment: Glucose reference range applies only to samples taken after fasting for at least 8 hours.   BUN 30 (H) 6 - 20 mg/dL   Creatinine, Ser 2.12 (H) 0.44 - 1.00 mg/dL   Calcium 8.9 8.9 - 10.3 mg/dL   Total Protein 6.9 6.5 - 8.1 g/dL   Albumin 3.3 (L) 3.5 - 5.0 g/dL   AST 10 (L) 15 - 41 U/L   ALT 8 0 - 44 U/L   Alkaline Phosphatase 72 38 - 126 U/L   Total Bilirubin 1.9 (H) 0.3 - 1.2 mg/dL   GFR calc non Af Amer 25 (L) >60 mL/min   GFR calc Af Amer 29 (L) >60 mL/min   Anion gap 8 5 - 15    Comment: Performed at [facility], [address] [place], [place] [address]  Lipase, blood     Status: None   Collection Time: 05/13/20  5:12 PM  Result Value Ref Range   Lipase 26 11 - 51 U/L    Comment: Performed at [facility], [place], [place] [address]  Troponin I (High Sensitivity)     Status: None   Collection Time: 05/13/20  9:15 PM  Result Value Ref Range   Troponin I (High Sensitivity) 7 <18 ng/L    Comment: (NOTE) Elevated high sensitivity troponin I (hsTnI) values and significant  changes across serial measurements may suggest ACS but many other  chronic and acute conditions are known to elevate hsTnI results.  Refer to the "Links" section for chest pain algorithms and additional  guidance. Performed at [facility], [address] [place], [place] [address]   Lactic acid, plasma     Status: None   Collection Time: 05/13/20 10:30 PM  Result Value Ref Range   Lactic Acid, Venous 1.5 0.5 - 1.9 mmol/L    Comment: Performed at  [facility], [address] [place],  [address]  SARS Coronavirus 2 by RT PCR (hospital order, performed in [facility] hospital lab) Nasopharyngeal Nasopharyngeal Swab     Status: None   Collection Time: 05/13/20 11:05 PM   Specimen: Nasopharyngeal Swab  Result  Value Ref Range   SARS Coronavirus 2 NEGATIVE NEGATIVE    Comment: (NOTE) SARS-CoV-2 target nucleic acids are NOT DETECTED.  The SARS-CoV-2 RNA is generally detectable in upper and lower respiratory specimens during the acute phase of infection. The lowest concentration of SARS-CoV-2 viral copies this assay can detect is 250 copies / mL. A negative result does not preclude SARS-CoV-2 infection and should not be used as the sole basis for treatment or other patient management decisions.  A negative result may occur with improper specimen collection / handling, submission of specimen other than nasopharyngeal swab, presence of viral mutation(s) within the areas targeted by this assay, and inadequate number of viral copies (<250 copies / mL). A negative result must be combined with clinical observations, patient history, and epidemiological information.  Fact Sheet for Patients:   StrictlyIdeas.no  Fact Sheet for Healthcare Providers: BankingDealers.co.za  This test is not yet approved or  cleared by the [place] FDA and has been authorized for detection and/or diagnosis of SARS-CoV-2 by FDA under an Emergency Use Authorization (EUA).  This EUA will remain in effect (meaning this test can be used) for the duration of the COVID-19 declaration under Section 564(b)(1) of the Act, 21 U.S.C. section 360bbb-3(b)(1), unless the authorization is terminated or revoked sooner.  Performed at [facility], [address] [place], [place] [address]   Urinalysis, Complete w Microscopic     Status: Abnormal   Collection Time: 05/14/20 12:45 AM  Result Value  Ref Range   Color, Urine YELLOW (A) YELLOW   APPearance HAZY (A) CLEAR   Specific Gravity, Urine 1.017 1.005 - 1.030   pH 5.0 5.0 - 8.0   Glucose, UA NEGATIVE NEGATIVE mg/dL   Hgb urine dipstick NEGATIVE NEGATIVE   Bilirubin Urine NEGATIVE NEGATIVE   Ketones, ur NEGATIVE NEGATIVE mg/dL   Protein, ur NEGATIVE NEGATIVE mg/dL   Nitrite NEGATIVE NEGATIVE   Leukocytes,Ua TRACE (A) NEGATIVE   RBC / HPF 0-5 0 - 5 RBC/hpf   WBC, UA 0-5 0 - 5 WBC/hpf   Bacteria, UA RARE (A) NONE SEEN   Squamous Epithelial / LPF 0-5 0 - 5   Hyaline Casts, UA PRESENT     Comment: Performed at [facility] [address] [place], [place] [address]  HIV Antibody (routine testing w rflx)     Status: None   Collection Time: 05/14/20  6:46 AM  Result Value Ref Range   HIV Screen 4th Generation wRfx Non Reactive Non Reactive    Comment: Performed at [facility] [address], [place], [place] [address]  Comprehensive metabolic panel     Status: Abnormal   Collection Time: 05/14/20  6:46 AM  Result Value Ref Range   Sodium 142 135 - 145 mmol/L   Potassium 4.3 3.5 - 5.1 mmol/L   Chloride 116 (H) 98 - 111 mmol/L   CO2 17 (L) 22 - 32 mmol/L   Glucose, Bld 151 (H) 70 - 99 mg/dL    Comment: Glucose reference range applies only to samples taken after fasting for at least 8 hours.   BUN 39 (H) 6 - 20 mg/dL   Creatinine, Ser 2.14 (H) 0.44 - 1.00 mg/dL   Calcium 8.3 (L) 8.9 - 10.3 mg/dL   Total Protein 6.3 (L) 6.5 - 8.1 g/dL   Albumin 2.8 (L) 3.5 - 5.0 g/dL   AST 9 (L) 15 - 41 U/L   ALT 10 0 - 44 U/L   Alkaline  Phosphatase 63 38 - 126 U/L   Total Bilirubin 1.2 0.3 - 1.2 mg/dL   GFR calc non Af Amer 25 (L) >60 mL/min   GFR calc Af Amer 29 (L) >60 mL/min   Anion gap 9 5 - 15    Comment: Performed at [facility], [address] [place], [place] [address]  CBC     Status: Abnormal   Collection Time: 05/14/20  6:46 AM  Result Value Ref Range   WBC 17.0 (H) 4.0 - 10.5 K/uL   RBC 3.23  (L) 3.87 - 5.11 MIL/uL   Hemoglobin 9.7 (L) 12.0 - 15.0 g/dL   HCT 29.3 (L) 36 - 46 %   MCV 90.7 80.0 - 100.0 fL   MCH 30.0 26.0 - 34.0 pg   MCHC 33.1 30.0 - 36.0 g/dL   RDW 15.7 (H) 11.5 - 15.5 %   Platelets 306 150 - 400 K/uL   nRBC 0.0 0.0 - 0.2 %    Comment: Performed at [facility], [address] [place], [place] [address]  Procalcitonin - Baseline     Status: None   Collection Time: 05/14/20  6:46 AM  Result Value Ref Range   Procalcitonin 1.41 ng/mL    Comment:        Interpretation: PCT > 0.5 ng/mL and <= 2 ng/mL: Systemic infection (sepsis) is possible, but other conditions are known to elevate PCT as well. (NOTE)       Sepsis PCT Algorithm           Lower Respiratory Tract                                      Infection PCT Algorithm    ----------------------------     ----------------------------         PCT < 0.25 ng/mL                PCT < 0.10 ng/mL          Strongly encourage             Strongly discourage   discontinuation of antibiotics    initiation of antibiotics    ----------------------------     -----------------------------       PCT 0.25 - 0.50 ng/mL            PCT 0.10 - 0.25 ng/mL               OR       >80% decrease in PCT            Discourage initiation of                                            antibiotics      Encourage discontinuation           of antibiotics    ----------------------------     -----------------------------         PCT >= 0.50 ng/mL              PCT 0.26 - 0.50 ng/mL                AND       <80% decrease in PCT  Encourage initiation of                                             antibiotics       Encourage continuation           of antibiotics    ----------------------------     -----------------------------        PCT >= 0.50 ng/mL                  PCT > 0.50 ng/mL               AND         increase in PCT                  Strongly encourage                                      initiation of  antibiotics    Strongly encourage escalation           of antibiotics                                     -----------------------------                                           PCT <= 0.25 ng/mL                                                 OR                                        > 80% decrease in PCT                                      Discontinue / Do not initiate                                             antibiotics  Performed at [facility], [address] [place], [place] [address]    CT ABDOMEN PELVIS WO CONTRAST  Result Date: 05/13/2020 CLINICAL DATA:  Abdominal pain with nausea and vomit EXAM: CT ABDOMEN AND PELVIS WITHOUT CONTRAST TECHNIQUE: Multidetector CT imaging of the abdomen and pelvis was performed following the standard protocol without IV contrast. COMPARISON:  May 25, 2016 FINDINGS: Lower chest: The visualized heart size within normal limits. No pericardial fluid/thickening. There is a small hiatal hernia. Multifocal patchy ground-glass opacities with tree-in-bud opacities are seen at both lung bases. Hepatobiliary: Although limited due to the lack of intravenous contrast, normal in appearance without gross focal abnormality. No evidence of calcified gallstones or biliary ductal dilatation. Pancreas:  Unremarkable.  No surrounding inflammatory changes. Spleen: Normal in size. Although limited due to the lack of intravenous contrast, normal in appearance. Adrenals/Urinary Tract: Both adrenal glands appear normal. The kidneys and collecting system appear normal without evidence of urinary tract calculus or hydronephrosis. Bladder is unremarkable. Stomach/Bowel: The stomach, small bowel, and colon are normal in appearance. No inflammatory changes or obstructive findings. appendix is normal. Vascular/Lymphatic: There are no enlarged abdominal or pelvic lymph nodes. Mild scattered aorto bi iliac atherosclerosis is seen. Reproductive: The uterus and adnexa are  unremarkable. Other: No evidence of abdominal wall mass or hernia. Musculoskeletal: No acute or significant osseous findings. IMPRESSION: Multifocal patchy ground-glass opacities are seen at both lung bases, concerning for multifocal pneumonia. No acute intra-abdominal or pelvic pathology to explain the patient's symptoms. Aortic Atherosclerosis (ICD10-I70.0). Electronically Signed   By: Prudencio Pair M.D.   On: 05/13/2020 22:18   DG Chest 2 View  Result Date: 05/13/2020 CLINICAL DATA:  Chest pain EXAM: CHEST - 2 VIEW COMPARISON:  July 02, 2019 FINDINGS: There is mild cardiomegaly. Tracheostomy tube tip seen at the level of the clavicular heads. Mildly increased interstitial markings seen at both lung bases which may be due to atelectasis and/chronic lung changes. No large airspace consolidation or effusion. The visualized skeletal structures are unremarkable. IMPRESSION: Mildly increased interstitial markings seen at both lung bases, likely due to atelectasis and/or chronic lung changes. Electronically Signed   By: Prudencio Pair M.D.   On: 05/13/2020 17:48    Pending Labs Unresulted Labs (From admission, onward) Comment          Start     Ordered   05/15/20 0500  CBC  Tomorrow morning,   STAT        05/14/20 1359   05/15/20 9935  Basic metabolic panel  Tomorrow morning,   STAT        05/14/20 1401          Vitals/Pain Today's Vitals   05/14/20 1019 05/14/20 1200 05/14/20 1407 05/14/20 1408  BP: 105/60  93/62 (!) 93/53  Pulse: 90  79 93  Resp: (!) 22   20  Temp: 98.5 F (36.9 C)     TempSrc: Oral     SpO2: 98%  97% 99%  Weight:      Height:      PainSc: 8  4       Isolation Precautions No active isolations  Medications Medications  cefTRIAXone (ROCEPHIN) 2 g in sodium chloride 0.9 % 100 mL IVPB (0 g Intravenous Stopped 05/14/20 0116)  baclofen (LIORESAL) tablet 20 mg (has no administration in time range)  buprenorphine (SUBUTEX) SL tablet 2 mg (2 mg Sublingual Not Given  05/14/20 1338)  QUEtiapine (SEROQUEL) tablet 25 mg (0 mg Oral Hold 05/14/20 0200)  tiZANidine (ZANAFLEX) tablet 4 mg (4 mg Oral Given 05/14/20 1404)  traZODone (DESYREL) tablet 100 mg (0 mg Oral Hold 05/14/20 0200)  pantoprazole (PROTONIX) EC tablet 20 mg (20 mg Oral Given 05/14/20 1010)  ondansetron (ZOFRAN) tablet 4 mg (has no administration in time range)  estradiol (ESTRACE) vaginal cream 1 Applicatorful (has no administration in time range)  lidocaine (LIDODERM) 5 % 1 patch (1 patch Transdermal Patch Applied 05/14/20 1015)  heparin injection 5,000 Units (5,000 Units Subcutaneous Given 05/14/20 1405)  sodium chloride flush (NS) 0.9 % injection 3 mL (3 mLs Intravenous Given 05/14/20 0335)  sodium chloride flush (NS) 0.9 % injection 3 mL (has no administration in time range)  0.9 %  sodium chloride infusion (has no administration in time range)  acetaminophen (TYLENOL) tablet 650 mg (has no administration in time range)    Or  acetaminophen (TYLENOL) suppository 650 mg (has no administration in time range)  albuterol (PROVENTIL) (2.5 MG/3ML) 0.083% nebulizer solution 2.5 mg (has no administration in time range)  gabapentin (NEURONTIN) capsule 300 mg (has no administration in time range)  0.9 %  sodium chloride infusion (has no administration in time range)  doxycycline (VIBRA-TABS) tablet 100 mg (has no administration in time range)  oxyCODONE-acetaminophen (PERCOCET) 7.5-325 MG per tablet 1 tablet (has no administration in time range)  sodium chloride 0.9 % bolus 1,000 mL (0 mLs Intravenous Stopped 05/14/20 0108)  doxycycline (VIBRAMYCIN) 100 mg in sodium chloride 0.9 % 250 mL IVPB (0 mg Intravenous Stopped 05/14/20 0335)    Mobility walks Low fall risk   Focused Assessments Cardiac Assessment Handoff:  Cardiac Rhythm: Normal sinus rhythm (HR 74) Lab Results  Component Value Date   CKTOTAL 133 10/31/2011   CKMB 7.6 (HH) 10/31/2011   TROPONINI <0.30 01/10/2014   Lab Results  Component  Value Date   DDIMER 0.42 01/27/2015   Does the Patient currently have chest pain? No     R Recommendations: See Admitting Provider Note  Report given to:   Additional Notes: iv in foot
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
## 2020-05-15 DIAGNOSIS — G8929 Other chronic pain: Secondary | ICD-10-CM

## 2020-05-15 DIAGNOSIS — M549 Dorsalgia, unspecified: Secondary | ICD-10-CM

## 2020-05-15 LAB — BASIC METABOLIC PANEL
Anion gap: 4 — ABNORMAL LOW (ref 5–15)
BUN: 42 mg/dL — ABNORMAL HIGH (ref 6–20)
CO2: 19 mmol/L — ABNORMAL LOW (ref 22–32)
Calcium: 8 mg/dL — ABNORMAL LOW (ref 8.9–10.3)
Chloride: 117 mmol/L — ABNORMAL HIGH (ref 98–111)
Creatinine, Ser: 1.61 mg/dL — ABNORMAL HIGH (ref 0.44–1.00)
GFR calc Af Amer: 40 mL/min — ABNORMAL LOW (ref >60)
GFR calc non Af Amer: 35 mL/min — ABNORMAL LOW (ref >60)
Glucose, Bld: 112 mg/dL — ABNORMAL HIGH (ref 70–99)
Potassium: 4.9 mmol/L (ref 3.5–5.1)
Sodium: 140 mmol/L (ref 135–145)

## 2020-05-15 LAB — CBC
HCT: 26.9 % — ABNORMAL LOW (ref 36.0–46.0)
Hemoglobin: 8.9 g/dL — ABNORMAL LOW (ref 12.0–15.0)
MCH: 30.5 pg (ref 26.0–34.0)
MCHC: 33.1 g/dL (ref 30.0–36.0)
MCV: 92.1 fL (ref 80.0–100.0)
Platelets: 259 10*3/uL (ref 150–400)
RBC: 2.92 MIL/uL — ABNORMAL LOW (ref 3.87–5.11)
RDW: 15.9 % — ABNORMAL HIGH (ref 11.5–15.5)
WBC: 10.3 10*3/uL (ref 4.0–10.5)
nRBC: 0 % (ref 0.0–0.2)
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
## 2020-05-16 LAB — GLUCOSE, CAPILLARY: Glucose-Capillary: 81 mg/dL (ref 70–99)

## 2020-05-16 MED ORDER — CEFDINIR 300 MG PO CAPS
300.0000 mg | ORAL_CAPSULE | Freq: Two times a day (BID) | ORAL | Status: DC
  Administered 2020-05-16: 300 mg via ORAL
  Filled 2020-05-16 (×2): qty 1

## 2020-05-16 MED ORDER — DOXYCYCLINE HYCLATE 100 MG PO TABS: 100.0000 mg | ORAL_TABLET | Freq: Two times a day (BID) | ORAL | 0 refills | Status: AC

## 2020-05-16 MED ORDER — ASPIRIN-ACETAMINOPHEN-CAFFEINE 250-250-65 MG PO TABS: 2.0000 | ORAL_TABLET | Freq: Once | ORAL | Status: DC

## 2020-05-16 MED ORDER — CEFDINIR 300 MG PO CAPS: 300.0000 mg | ORAL_CAPSULE | Freq: Two times a day (BID) | ORAL | 0 refills | Status: AC

## 2020-05-16 MED ORDER — BUTALBITAL-APAP-CAFFEINE 50-325-40 MG PO TABS
2.0000 | ORAL_TABLET | Freq: Once | ORAL | Status: AC
  Administered 2020-05-16: 14:00:00 2 via ORAL
  Filled 2020-05-16: qty 2

## 2020-05-16 NOTE — Plan of Care (Signed)

## 2020-05-16 NOTE — Discharge Instructions (Signed)
Community-Acquired Pneumonia, Adult Pneumonia is an infection of the lungs. It causes swelling in the airways of the lungs. Mucus and fluid may also build up inside the airways. One type of pneumonia can happen while a person is in a hospital. A different type can happen when a person is not in a hospital (community-acquired pneumonia).  What are the causes?  This condition is caused by germs (viruses, bacteria, or fungi). Some types of germs can be passed from one person to another. This can happen when you breathe in droplets from the cough or sneeze of an infected person. What increases the risk? You are more likely to develop this condition if you:  Have a long-term (chronic) disease, such as: ? Chronic obstructive pulmonary disease (COPD). ? Asthma. ? Cystic fibrosis. ? Congestive heart failure. ? Diabetes. ? Kidney disease.  Have HIV.  Have sickle cell disease.  Have had your spleen removed.  Do not take good care of your teeth and mouth (poor dental hygiene).  Have a medical condition that increases the risk of breathing in droplets from your own mouth and nose.  Have a weakened body defense system (immune system).  Are a smoker.  Travel to areas where the germs that cause this illness are common.  Are around certain animals or the places they live. What are the signs or symptoms?  A dry cough.  A wet (productive) cough.  Fever.  Sweating.  Chest pain. This often happens when breathing deeply or coughing.  Fast breathing or trouble breathing.  Shortness of breath.  Shaking chills.  Feeling tired (fatigue).  Muscle aches. How is this treated? Treatment for this condition depends on many things. Most adults can be treated at home. In some cases, treatment must happen in a hospital. Treatment may include:  Medicines given by mouth or through an IV tube.  Being given extra oxygen.  Respiratory therapy. In rare cases, treatment for very bad pneumonia  may include:  Using a machine to help you breathe.  Having a procedure to remove fluid from around your lungs. Follow these instructions at home: Medicines  Take over-the-counter and prescription medicines only as told by your doctor. ? Only take cough medicine if you are losing sleep.  If you were prescribed an antibiotic medicine, take it as told by your doctor. Do not stop taking the antibiotic even if you start to feel better. General instructions   Sleep with your head and neck raised (elevated). You can do this by sleeping in a recliner or by putting a few pillows under your head.  Rest as needed. Get at least 8 hours of sleep each night.  Drink enough water to keep your pee (urine) pale yellow.  Eat a healthy diet that includes plenty of vegetables, fruits, whole grains, low-fat dairy products, and lean protein.  Do not use any products that contain nicotine or tobacco. These include cigarettes, e-cigarettes, and chewing tobacco. If you need help quitting, ask your doctor.  Keep all follow-up visits as told by your doctor. This is important. How is this prevented? A shot (vaccine) can help prevent pneumonia. Shots are often suggested for:  People older than 58 years of age.  People older than 58 years of age who: ? Are having cancer treatment. ? Have long-term (chronic) lung disease. ? Have problems with their body's defense system. You may also prevent pneumonia if you take these actions:  Get the flu (influenza) shot every year.  Go to the dentist as   often as told.  Wash your hands often. If you cannot use soap and water, use hand sanitizer. Contact a doctor if:  You have a fever.  You lose sleep because your cough medicine does not help. Get help right away if:  You are short of breath and it gets worse.  You have more chest pain.  Your sickness gets worse. This is very serious if: ? You are an older adult. ? Your body's defense system is weak.  You  cough up blood. Summary  Pneumonia is an infection of the lungs.  Most adults can be treated at home. Some will need treatment in a hospital.  Drink enough water to keep your pee pale yellow.  Get at least 8 hours of sleep each night. This information is not intended to replace advice given to you by your health care provider. Make sure you discuss any questions you have with your health care provider. Document Revised: 02/06/2019 Document Reviewed: 06/14/2018 Elsevier Patient Education  2020 [employer]  

## 2020-05-16 NOTE — Progress Notes (Signed)
MD order received in [facility] to discharge pt home today; verbally reviewed AVS with pt; Rxs escribed to [facility] in [place], [place]; no questions voiced at this time; pt discharged via wheelchair by nursing to the [place] entrance (ride waiting there for her)
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
## 2020-05-26 DIAGNOSIS — G4700 Insomnia, unspecified: Secondary | ICD-10-CM | POA: Diagnosis not present

## 2020-05-26 DIAGNOSIS — N1832 Chronic kidney disease, stage 3b: Secondary | ICD-10-CM | POA: Diagnosis not present

## 2020-05-26 DIAGNOSIS — M549 Dorsalgia, unspecified: Secondary | ICD-10-CM | POA: Diagnosis not present

## 2020-05-26 DIAGNOSIS — K30 Functional dyspepsia: Secondary | ICD-10-CM | POA: Diagnosis not present

## 2020-05-29 DIAGNOSIS — M545 Low back pain: Secondary | ICD-10-CM | POA: Diagnosis not present

## 2020-05-29 DIAGNOSIS — M5412 Radiculopathy, cervical region: Secondary | ICD-10-CM | POA: Diagnosis not present

## 2020-05-29 DIAGNOSIS — R202 Paresthesia of skin: Secondary | ICD-10-CM | POA: Diagnosis not present

## 2020-05-29 DIAGNOSIS — M546 Pain in thoracic spine: Secondary | ICD-10-CM | POA: Diagnosis not present

## 2020-05-29 DIAGNOSIS — M542 Cervicalgia: Secondary | ICD-10-CM | POA: Diagnosis not present

## 2020-05-29 DIAGNOSIS — G894 Chronic pain syndrome: Secondary | ICD-10-CM | POA: Diagnosis not present

## 2020-05-29 DIAGNOSIS — G8929 Other chronic pain: Secondary | ICD-10-CM | POA: Diagnosis not present

## 2020-05-29 DIAGNOSIS — Z79891 Long term (current) use of opiate analgesic: Secondary | ICD-10-CM | POA: Diagnosis not present

## 2020-06-23 DIAGNOSIS — M549 Dorsalgia, unspecified: Secondary | ICD-10-CM | POA: Diagnosis not present

## 2020-06-23 DIAGNOSIS — I10 Essential (primary) hypertension: Secondary | ICD-10-CM | POA: Diagnosis not present

## 2020-06-23 DIAGNOSIS — G8929 Other chronic pain: Secondary | ICD-10-CM | POA: Diagnosis not present

## 2020-06-23 DIAGNOSIS — G629 Polyneuropathy, unspecified: Secondary | ICD-10-CM | POA: Diagnosis not present

## 2020-06-23 DIAGNOSIS — M129 Arthropathy, unspecified: Secondary | ICD-10-CM | POA: Diagnosis not present

## 2020-06-23 DIAGNOSIS — N1832 Chronic kidney disease, stage 3b: Secondary | ICD-10-CM | POA: Diagnosis not present

## 2020-06-23 DIAGNOSIS — R5383 Other fatigue: Secondary | ICD-10-CM | POA: Diagnosis not present

## 2020-06-23 DIAGNOSIS — M791 Myalgia, unspecified site: Secondary | ICD-10-CM | POA: Diagnosis not present

## 2020-06-26 DIAGNOSIS — M542 Cervicalgia: Secondary | ICD-10-CM | POA: Diagnosis not present

## 2020-06-26 DIAGNOSIS — M546 Pain in thoracic spine: Secondary | ICD-10-CM | POA: Diagnosis not present

## 2020-06-26 DIAGNOSIS — Z79891 Long term (current) use of opiate analgesic: Secondary | ICD-10-CM | POA: Diagnosis not present

## 2020-06-26 DIAGNOSIS — G890 Central pain syndrome: Secondary | ICD-10-CM | POA: Diagnosis not present

## 2020-06-26 DIAGNOSIS — G8929 Other chronic pain: Secondary | ICD-10-CM | POA: Diagnosis not present

## 2020-06-26 DIAGNOSIS — G894 Chronic pain syndrome: Secondary | ICD-10-CM | POA: Diagnosis not present

## 2020-06-26 DIAGNOSIS — M545 Low back pain: Secondary | ICD-10-CM | POA: Diagnosis not present

## 2020-06-26 DIAGNOSIS — M5412 Radiculopathy, cervical region: Secondary | ICD-10-CM | POA: Diagnosis not present

## 2020-07-21 DIAGNOSIS — M797 Fibromyalgia: Secondary | ICD-10-CM | POA: Diagnosis not present

## 2020-07-21 DIAGNOSIS — Z743 Need for continuous supervision: Secondary | ICD-10-CM | POA: Diagnosis not present

## 2020-07-21 DIAGNOSIS — J90 Pleural effusion, not elsewhere classified: Secondary | ICD-10-CM | POA: Diagnosis not present

## 2020-07-21 DIAGNOSIS — U071 COVID-19: Secondary | ICD-10-CM | POA: Diagnosis not present

## 2020-07-21 DIAGNOSIS — D6869 Other thrombophilia: Secondary | ICD-10-CM | POA: Diagnosis not present

## 2020-07-21 DIAGNOSIS — Z79891 Long term (current) use of opiate analgesic: Secondary | ICD-10-CM | POA: Diagnosis not present

## 2020-07-21 DIAGNOSIS — R0902 Hypoxemia: Secondary | ICD-10-CM | POA: Diagnosis not present

## 2020-07-21 DIAGNOSIS — Z9981 Dependence on supplemental oxygen: Secondary | ICD-10-CM | POA: Diagnosis not present

## 2020-07-21 DIAGNOSIS — A4189 Other specified sepsis: Secondary | ICD-10-CM | POA: Diagnosis not present

## 2020-07-21 DIAGNOSIS — T40601A Poisoning by unspecified narcotics, accidental (unintentional), initial encounter: Secondary | ICD-10-CM | POA: Diagnosis not present

## 2020-07-21 DIAGNOSIS — Z930 Tracheostomy status: Secondary | ICD-10-CM | POA: Diagnosis not present

## 2020-07-21 DIAGNOSIS — G8929 Other chronic pain: Secondary | ICD-10-CM | POA: Diagnosis not present

## 2020-07-21 DIAGNOSIS — R0600 Dyspnea, unspecified: Secondary | ICD-10-CM | POA: Diagnosis not present

## 2020-07-21 DIAGNOSIS — J9601 Acute respiratory failure with hypoxia: Secondary | ICD-10-CM | POA: Diagnosis not present

## 2020-07-21 DIAGNOSIS — I509 Heart failure, unspecified: Secondary | ICD-10-CM | POA: Diagnosis not present

## 2020-07-21 DIAGNOSIS — R6520 Severe sepsis without septic shock: Secondary | ICD-10-CM | POA: Diagnosis not present

## 2020-07-21 DIAGNOSIS — J9621 Acute and chronic respiratory failure with hypoxia: Secondary | ICD-10-CM | POA: Diagnosis not present

## 2020-07-21 DIAGNOSIS — J80 Acute respiratory distress syndrome: Secondary | ICD-10-CM | POA: Diagnosis not present

## 2020-07-21 DIAGNOSIS — R918 Other nonspecific abnormal finding of lung field: Secondary | ICD-10-CM | POA: Diagnosis not present

## 2020-07-21 DIAGNOSIS — I519 Heart disease, unspecified: Secondary | ICD-10-CM | POA: Diagnosis not present

## 2020-07-21 DIAGNOSIS — R531 Weakness: Secondary | ICD-10-CM | POA: Diagnosis not present

## 2020-07-21 DIAGNOSIS — E860 Dehydration: Secondary | ICD-10-CM | POA: Diagnosis not present

## 2020-07-21 DIAGNOSIS — J386 Stenosis of larynx: Secondary | ICD-10-CM | POA: Diagnosis not present

## 2020-07-21 DIAGNOSIS — R069 Unspecified abnormalities of breathing: Secondary | ICD-10-CM | POA: Diagnosis not present

## 2020-07-21 DIAGNOSIS — R231 Pallor: Secondary | ICD-10-CM | POA: Diagnosis not present

## 2020-07-21 DIAGNOSIS — N179 Acute kidney failure, unspecified: Secondary | ICD-10-CM | POA: Diagnosis not present

## 2020-07-21 DIAGNOSIS — J1282 Pneumonia due to coronavirus disease 2019: Secondary | ICD-10-CM | POA: Diagnosis not present

## 2020-07-23 DIAGNOSIS — I313 Pericardial effusion (noninflammatory): Secondary | ICD-10-CM | POA: Diagnosis not present

## 2020-07-23 DIAGNOSIS — J1282 Pneumonia due to coronavirus disease 2019: Secondary | ICD-10-CM | POA: Diagnosis not present

## 2020-07-23 DIAGNOSIS — Z79891 Long term (current) use of opiate analgesic: Secondary | ICD-10-CM | POA: Diagnosis not present

## 2020-07-23 DIAGNOSIS — J386 Stenosis of larynx: Secondary | ICD-10-CM | POA: Diagnosis not present

## 2020-07-23 DIAGNOSIS — G8929 Other chronic pain: Secondary | ICD-10-CM | POA: Diagnosis not present

## 2020-07-23 DIAGNOSIS — U071 COVID-19: Secondary | ICD-10-CM | POA: Diagnosis not present

## 2020-07-23 DIAGNOSIS — J80 Acute respiratory distress syndrome: Secondary | ICD-10-CM | POA: Diagnosis not present

## 2020-07-23 DIAGNOSIS — N179 Acute kidney failure, unspecified: Secondary | ICD-10-CM | POA: Diagnosis not present

## 2020-07-23 DIAGNOSIS — Z930 Tracheostomy status: Secondary | ICD-10-CM | POA: Diagnosis not present

## 2020-07-23 DIAGNOSIS — J9601 Acute respiratory failure with hypoxia: Secondary | ICD-10-CM | POA: Diagnosis not present

## 2020-07-23 DIAGNOSIS — J9621 Acute and chronic respiratory failure with hypoxia: Secondary | ICD-10-CM | POA: Diagnosis not present

## 2020-07-23 DIAGNOSIS — A4189 Other specified sepsis: Secondary | ICD-10-CM | POA: Diagnosis not present

## 2020-07-23 DIAGNOSIS — R6520 Severe sepsis without septic shock: Secondary | ICD-10-CM | POA: Diagnosis not present

## 2020-07-23 DIAGNOSIS — D6869 Other thrombophilia: Secondary | ICD-10-CM | POA: Diagnosis not present

## 2020-07-23 DIAGNOSIS — M797 Fibromyalgia: Secondary | ICD-10-CM | POA: Diagnosis not present

## 2020-07-24 DIAGNOSIS — I313 Pericardial effusion (noninflammatory): Secondary | ICD-10-CM | POA: Diagnosis not present

## 2020-07-26 DIAGNOSIS — J9601 Acute respiratory failure with hypoxia: Secondary | ICD-10-CM | POA: Diagnosis not present

## 2020-07-26 DIAGNOSIS — G8929 Other chronic pain: Secondary | ICD-10-CM | POA: Diagnosis not present

## 2020-07-27 DIAGNOSIS — J9601 Acute respiratory failure with hypoxia: Secondary | ICD-10-CM | POA: Diagnosis not present

## 2020-07-27 DIAGNOSIS — G8929 Other chronic pain: Secondary | ICD-10-CM | POA: Diagnosis not present

## 2020-07-28 DIAGNOSIS — J9601 Acute respiratory failure with hypoxia: Secondary | ICD-10-CM | POA: Diagnosis not present

## 2020-07-28 DIAGNOSIS — G8929 Other chronic pain: Secondary | ICD-10-CM | POA: Diagnosis not present

## 2020-07-29 DIAGNOSIS — G8929 Other chronic pain: Secondary | ICD-10-CM | POA: Diagnosis not present

## 2020-07-29 DIAGNOSIS — J9601 Acute respiratory failure with hypoxia: Secondary | ICD-10-CM | POA: Diagnosis not present

## 2020-07-30 DIAGNOSIS — G8929 Other chronic pain: Secondary | ICD-10-CM | POA: Diagnosis not present

## 2020-07-30 DIAGNOSIS — J9601 Acute respiratory failure with hypoxia: Secondary | ICD-10-CM | POA: Diagnosis not present

## 2020-07-31 DIAGNOSIS — J9601 Acute respiratory failure with hypoxia: Secondary | ICD-10-CM | POA: Diagnosis not present

## 2020-07-31 DIAGNOSIS — J386 Stenosis of larynx: Secondary | ICD-10-CM | POA: Diagnosis not present

## 2020-08-03 DIAGNOSIS — J9611 Chronic respiratory failure with hypoxia: Secondary | ICD-10-CM | POA: Diagnosis not present

## 2020-08-03 DIAGNOSIS — M797 Fibromyalgia: Secondary | ICD-10-CM | POA: Diagnosis not present

## 2020-08-03 DIAGNOSIS — Z930 Tracheostomy status: Secondary | ICD-10-CM | POA: Diagnosis not present

## 2020-08-03 DIAGNOSIS — F32A Depression, unspecified: Secondary | ICD-10-CM | POA: Diagnosis not present

## 2020-08-03 DIAGNOSIS — G8929 Other chronic pain: Secondary | ICD-10-CM | POA: Diagnosis not present

## 2020-08-03 DIAGNOSIS — Z9981 Dependence on supplemental oxygen: Secondary | ICD-10-CM | POA: Diagnosis not present

## 2020-08-03 DIAGNOSIS — J386 Stenosis of larynx: Secondary | ICD-10-CM | POA: Diagnosis not present

## 2020-08-03 DIAGNOSIS — J309 Allergic rhinitis, unspecified: Secondary | ICD-10-CM | POA: Diagnosis not present

## 2020-08-03 DIAGNOSIS — J398 Other specified diseases of upper respiratory tract: Secondary | ICD-10-CM | POA: Diagnosis not present

## 2020-08-04 DIAGNOSIS — Z9981 Dependence on supplemental oxygen: Secondary | ICD-10-CM | POA: Diagnosis not present

## 2020-08-04 DIAGNOSIS — J309 Allergic rhinitis, unspecified: Secondary | ICD-10-CM | POA: Diagnosis not present

## 2020-08-04 DIAGNOSIS — F32A Depression, unspecified: Secondary | ICD-10-CM | POA: Diagnosis not present

## 2020-08-04 DIAGNOSIS — J398 Other specified diseases of upper respiratory tract: Secondary | ICD-10-CM | POA: Diagnosis not present

## 2020-08-04 DIAGNOSIS — M797 Fibromyalgia: Secondary | ICD-10-CM | POA: Diagnosis not present

## 2020-08-04 DIAGNOSIS — J9611 Chronic respiratory failure with hypoxia: Secondary | ICD-10-CM | POA: Diagnosis not present

## 2020-08-04 DIAGNOSIS — G8929 Other chronic pain: Secondary | ICD-10-CM | POA: Diagnosis not present

## 2020-08-04 DIAGNOSIS — Z930 Tracheostomy status: Secondary | ICD-10-CM | POA: Diagnosis not present

## 2020-08-04 DIAGNOSIS — J386 Stenosis of larynx: Secondary | ICD-10-CM | POA: Diagnosis not present

## 2020-08-26 DIAGNOSIS — G894 Chronic pain syndrome: Secondary | ICD-10-CM | POA: Diagnosis not present

## 2020-08-26 DIAGNOSIS — R202 Paresthesia of skin: Secondary | ICD-10-CM | POA: Diagnosis not present

## 2020-08-26 DIAGNOSIS — Z79891 Long term (current) use of opiate analgesic: Secondary | ICD-10-CM | POA: Diagnosis not present

## 2020-08-26 DIAGNOSIS — M542 Cervicalgia: Secondary | ICD-10-CM | POA: Diagnosis not present

## 2020-08-26 DIAGNOSIS — M545 Low back pain, unspecified: Secondary | ICD-10-CM | POA: Diagnosis not present

## 2020-08-26 DIAGNOSIS — G8929 Other chronic pain: Secondary | ICD-10-CM | POA: Diagnosis not present

## 2020-08-26 DIAGNOSIS — M5412 Radiculopathy, cervical region: Secondary | ICD-10-CM | POA: Diagnosis not present

## 2020-08-26 DIAGNOSIS — M546 Pain in thoracic spine: Secondary | ICD-10-CM | POA: Diagnosis not present

## 2020-11-02 DIAGNOSIS — M549 Dorsalgia, unspecified: Secondary | ICD-10-CM | POA: Diagnosis not present

## 2020-11-02 DIAGNOSIS — G8929 Other chronic pain: Secondary | ICD-10-CM | POA: Diagnosis not present

## 2020-11-02 DIAGNOSIS — R110 Nausea: Secondary | ICD-10-CM | POA: Diagnosis not present

## 2020-11-13 DIAGNOSIS — J9690 Respiratory failure, unspecified, unspecified whether with hypoxia or hypercapnia: Secondary | ICD-10-CM | POA: Diagnosis not present

## 2020-11-13 DIAGNOSIS — Z930 Tracheostomy status: Secondary | ICD-10-CM | POA: Diagnosis not present

## 2020-11-13 DIAGNOSIS — J398 Other specified diseases of upper respiratory tract: Secondary | ICD-10-CM | POA: Diagnosis not present

## 2020-11-13 DIAGNOSIS — R1319 Other dysphagia: Secondary | ICD-10-CM | POA: Diagnosis not present

## 2020-11-20 DIAGNOSIS — G4700 Insomnia, unspecified: Secondary | ICD-10-CM | POA: Diagnosis not present

## 2020-11-20 DIAGNOSIS — M549 Dorsalgia, unspecified: Secondary | ICD-10-CM | POA: Diagnosis not present

## 2020-11-20 DIAGNOSIS — I10 Essential (primary) hypertension: Secondary | ICD-10-CM | POA: Diagnosis not present

## 2020-11-20 DIAGNOSIS — G8929 Other chronic pain: Secondary | ICD-10-CM | POA: Diagnosis not present

## 2020-11-20 DIAGNOSIS — K30 Functional dyspepsia: Secondary | ICD-10-CM | POA: Diagnosis not present

## 2020-11-20 DIAGNOSIS — N1832 Chronic kidney disease, stage 3b: Secondary | ICD-10-CM | POA: Diagnosis not present

## 2020-11-30 DIAGNOSIS — Z79891 Long term (current) use of opiate analgesic: Secondary | ICD-10-CM | POA: Diagnosis not present

## 2020-11-30 DIAGNOSIS — M79604 Pain in right leg: Secondary | ICD-10-CM | POA: Diagnosis not present

## 2020-11-30 DIAGNOSIS — M545 Low back pain, unspecified: Secondary | ICD-10-CM | POA: Diagnosis not present

## 2020-11-30 DIAGNOSIS — Z79899 Other long term (current) drug therapy: Secondary | ICD-10-CM | POA: Diagnosis not present

## 2020-11-30 DIAGNOSIS — G894 Chronic pain syndrome: Secondary | ICD-10-CM | POA: Diagnosis not present

## 2020-12-23 DIAGNOSIS — M792 Neuralgia and neuritis, unspecified: Secondary | ICD-10-CM | POA: Diagnosis not present

## 2020-12-23 DIAGNOSIS — G894 Chronic pain syndrome: Secondary | ICD-10-CM | POA: Diagnosis not present

## 2020-12-28 DIAGNOSIS — M545 Low back pain, unspecified: Secondary | ICD-10-CM | POA: Diagnosis not present

## 2020-12-28 DIAGNOSIS — M79604 Pain in right leg: Secondary | ICD-10-CM | POA: Diagnosis not present

## 2020-12-28 DIAGNOSIS — G894 Chronic pain syndrome: Secondary | ICD-10-CM | POA: Diagnosis not present

## 2020-12-29 ENCOUNTER — Other Ambulatory Visit: Payer: Self-pay | Admitting: Pain Medicine

## 2020-12-29 DIAGNOSIS — M545 Low back pain, unspecified: Secondary | ICD-10-CM

## 2020-12-29 DIAGNOSIS — G8929 Other chronic pain: Secondary | ICD-10-CM

## 2021-01-01 DIAGNOSIS — M545 Low back pain, unspecified: Secondary | ICD-10-CM | POA: Diagnosis not present

## 2021-01-25 DIAGNOSIS — M545 Low back pain, unspecified: Secondary | ICD-10-CM | POA: Diagnosis not present

## 2021-01-25 DIAGNOSIS — M79604 Pain in right leg: Secondary | ICD-10-CM | POA: Diagnosis not present

## 2021-01-25 DIAGNOSIS — G894 Chronic pain syndrome: Secondary | ICD-10-CM | POA: Diagnosis not present

## 2021-01-25 DIAGNOSIS — M47817 Spondylosis without myelopathy or radiculopathy, lumbosacral region: Secondary | ICD-10-CM | POA: Diagnosis not present

## 2021-02-01 DIAGNOSIS — M549 Dorsalgia, unspecified: Secondary | ICD-10-CM | POA: Diagnosis not present

## 2021-02-01 DIAGNOSIS — G8929 Other chronic pain: Secondary | ICD-10-CM | POA: Diagnosis not present

## 2021-02-01 DIAGNOSIS — N1832 Chronic kidney disease, stage 3b: Secondary | ICD-10-CM | POA: Diagnosis not present

## 2021-02-01 DIAGNOSIS — I10 Essential (primary) hypertension: Secondary | ICD-10-CM | POA: Diagnosis not present

## 2021-02-01 DIAGNOSIS — G4700 Insomnia, unspecified: Secondary | ICD-10-CM | POA: Diagnosis not present

## 2021-02-10 DIAGNOSIS — M47817 Spondylosis without myelopathy or radiculopathy, lumbosacral region: Secondary | ICD-10-CM | POA: Diagnosis not present

## 2021-03-01 DIAGNOSIS — M47817 Spondylosis without myelopathy or radiculopathy, lumbosacral region: Secondary | ICD-10-CM | POA: Diagnosis not present

## 2021-03-01 DIAGNOSIS — M79604 Pain in right leg: Secondary | ICD-10-CM | POA: Diagnosis not present

## 2021-03-01 DIAGNOSIS — M545 Low back pain, unspecified: Secondary | ICD-10-CM | POA: Diagnosis not present

## 2021-03-01 DIAGNOSIS — Z79891 Long term (current) use of opiate analgesic: Secondary | ICD-10-CM | POA: Diagnosis not present

## 2021-03-01 DIAGNOSIS — G894 Chronic pain syndrome: Secondary | ICD-10-CM | POA: Diagnosis not present

## 2021-03-01 DIAGNOSIS — Z79899 Other long term (current) drug therapy: Secondary | ICD-10-CM | POA: Diagnosis not present

## 2021-03-08 DIAGNOSIS — M4727 Other spondylosis with radiculopathy, lumbosacral region: Secondary | ICD-10-CM | POA: Diagnosis not present

## 2021-03-09 DIAGNOSIS — G8929 Other chronic pain: Secondary | ICD-10-CM | POA: Diagnosis not present

## 2021-03-09 DIAGNOSIS — M549 Dorsalgia, unspecified: Secondary | ICD-10-CM | POA: Diagnosis not present

## 2021-03-09 DIAGNOSIS — N1832 Chronic kidney disease, stage 3b: Secondary | ICD-10-CM | POA: Diagnosis not present

## 2021-03-09 DIAGNOSIS — G43909 Migraine, unspecified, not intractable, without status migrainosus: Secondary | ICD-10-CM | POA: Diagnosis not present

## 2021-03-09 DIAGNOSIS — I10 Essential (primary) hypertension: Secondary | ICD-10-CM | POA: Diagnosis not present

## 2021-03-31 DIAGNOSIS — R200 Anesthesia of skin: Secondary | ICD-10-CM | POA: Diagnosis not present

## 2021-03-31 DIAGNOSIS — G894 Chronic pain syndrome: Secondary | ICD-10-CM | POA: Diagnosis not present

## 2021-03-31 DIAGNOSIS — M79604 Pain in right leg: Secondary | ICD-10-CM | POA: Diagnosis not present

## 2021-03-31 DIAGNOSIS — M47817 Spondylosis without myelopathy or radiculopathy, lumbosacral region: Secondary | ICD-10-CM | POA: Diagnosis not present

## 2021-04-28 DIAGNOSIS — M47817 Spondylosis without myelopathy or radiculopathy, lumbosacral region: Secondary | ICD-10-CM | POA: Diagnosis not present

## 2021-05-12 DIAGNOSIS — M47817 Spondylosis without myelopathy or radiculopathy, lumbosacral region: Secondary | ICD-10-CM | POA: Diagnosis not present

## 2021-05-26 DIAGNOSIS — R200 Anesthesia of skin: Secondary | ICD-10-CM | POA: Diagnosis not present

## 2021-05-26 DIAGNOSIS — G894 Chronic pain syndrome: Secondary | ICD-10-CM | POA: Diagnosis not present

## 2021-05-26 DIAGNOSIS — M47817 Spondylosis without myelopathy or radiculopathy, lumbosacral region: Secondary | ICD-10-CM | POA: Diagnosis not present

## 2021-05-26 DIAGNOSIS — M79604 Pain in right leg: Secondary | ICD-10-CM | POA: Diagnosis not present

## 2021-05-28 DIAGNOSIS — J9690 Respiratory failure, unspecified, unspecified whether with hypoxia or hypercapnia: Secondary | ICD-10-CM | POA: Diagnosis not present

## 2021-05-28 DIAGNOSIS — R1319 Other dysphagia: Secondary | ICD-10-CM | POA: Diagnosis not present

## 2021-05-28 DIAGNOSIS — J398 Other specified diseases of upper respiratory tract: Secondary | ICD-10-CM | POA: Diagnosis not present

## 2021-05-28 DIAGNOSIS — Z930 Tracheostomy status: Secondary | ICD-10-CM | POA: Diagnosis not present

## 2021-05-31 DIAGNOSIS — I10 Essential (primary) hypertension: Secondary | ICD-10-CM | POA: Diagnosis not present

## 2021-05-31 DIAGNOSIS — I700 Atherosclerosis of aorta: Secondary | ICD-10-CM | POA: Diagnosis not present

## 2021-06-28 DIAGNOSIS — Z79891 Long term (current) use of opiate analgesic: Secondary | ICD-10-CM | POA: Diagnosis not present

## 2021-06-28 DIAGNOSIS — M79604 Pain in right leg: Secondary | ICD-10-CM | POA: Diagnosis not present

## 2021-06-28 DIAGNOSIS — R200 Anesthesia of skin: Secondary | ICD-10-CM | POA: Diagnosis not present

## 2021-06-28 DIAGNOSIS — M47817 Spondylosis without myelopathy or radiculopathy, lumbosacral region: Secondary | ICD-10-CM | POA: Diagnosis not present

## 2021-07-07 DIAGNOSIS — M549 Dorsalgia, unspecified: Secondary | ICD-10-CM | POA: Diagnosis not present

## 2021-07-07 DIAGNOSIS — I10 Essential (primary) hypertension: Secondary | ICD-10-CM | POA: Diagnosis not present

## 2021-07-07 DIAGNOSIS — G8929 Other chronic pain: Secondary | ICD-10-CM | POA: Diagnosis not present

## 2021-07-07 DIAGNOSIS — G4700 Insomnia, unspecified: Secondary | ICD-10-CM | POA: Diagnosis not present

## 2021-07-07 DIAGNOSIS — G43909 Migraine, unspecified, not intractable, without status migrainosus: Secondary | ICD-10-CM | POA: Diagnosis not present

## 2021-07-28 DIAGNOSIS — Z79891 Long term (current) use of opiate analgesic: Secondary | ICD-10-CM | POA: Diagnosis not present

## 2021-07-28 DIAGNOSIS — G894 Chronic pain syndrome: Secondary | ICD-10-CM | POA: Diagnosis not present

## 2021-07-28 DIAGNOSIS — Z79899 Other long term (current) drug therapy: Secondary | ICD-10-CM | POA: Diagnosis not present

## 2021-08-25 DIAGNOSIS — M47817 Spondylosis without myelopathy or radiculopathy, lumbosacral region: Secondary | ICD-10-CM | POA: Diagnosis not present

## 2021-08-25 DIAGNOSIS — M79604 Pain in right leg: Secondary | ICD-10-CM | POA: Diagnosis not present

## 2021-08-25 DIAGNOSIS — R200 Anesthesia of skin: Secondary | ICD-10-CM | POA: Diagnosis not present

## 2021-08-25 DIAGNOSIS — Z79891 Long term (current) use of opiate analgesic: Secondary | ICD-10-CM | POA: Diagnosis not present

## 2021-08-25 DIAGNOSIS — Z79899 Other long term (current) drug therapy: Secondary | ICD-10-CM | POA: Diagnosis not present

## 2021-08-25 DIAGNOSIS — G894 Chronic pain syndrome: Secondary | ICD-10-CM | POA: Diagnosis not present

## 2021-10-11 DIAGNOSIS — I10 Essential (primary) hypertension: Secondary | ICD-10-CM | POA: Diagnosis not present

## 2021-10-11 DIAGNOSIS — K30 Functional dyspepsia: Secondary | ICD-10-CM | POA: Diagnosis not present

## 2021-10-11 DIAGNOSIS — G43909 Migraine, unspecified, not intractable, without status migrainosus: Secondary | ICD-10-CM | POA: Diagnosis not present

## 2021-10-11 DIAGNOSIS — J069 Acute upper respiratory infection, unspecified: Secondary | ICD-10-CM | POA: Diagnosis not present

## 2021-10-11 DIAGNOSIS — M549 Dorsalgia, unspecified: Secondary | ICD-10-CM | POA: Diagnosis not present

## 2021-10-11 DIAGNOSIS — G8929 Other chronic pain: Secondary | ICD-10-CM | POA: Diagnosis not present

## 2021-10-11 DIAGNOSIS — Z23 Encounter for immunization: Secondary | ICD-10-CM | POA: Diagnosis not present

## 2021-10-11 DIAGNOSIS — Z6824 Body mass index (BMI) 24.0-24.9, adult: Secondary | ICD-10-CM | POA: Diagnosis not present

## 2021-10-11 DIAGNOSIS — G4700 Insomnia, unspecified: Secondary | ICD-10-CM | POA: Diagnosis not present

## 2021-10-11 DIAGNOSIS — I700 Atherosclerosis of aorta: Secondary | ICD-10-CM | POA: Diagnosis not present

## 2021-10-26 DIAGNOSIS — N1831 Chronic kidney disease, stage 3a: Secondary | ICD-10-CM | POA: Diagnosis not present

## 2021-10-26 DIAGNOSIS — I10 Essential (primary) hypertension: Secondary | ICD-10-CM | POA: Diagnosis not present

## 2021-10-26 DIAGNOSIS — G629 Polyneuropathy, unspecified: Secondary | ICD-10-CM | POA: Diagnosis not present

## 2021-10-26 DIAGNOSIS — G4700 Insomnia, unspecified: Secondary | ICD-10-CM | POA: Diagnosis not present

## 2021-10-26 DIAGNOSIS — K30 Functional dyspepsia: Secondary | ICD-10-CM | POA: Diagnosis not present

## 2021-10-26 DIAGNOSIS — G8929 Other chronic pain: Secondary | ICD-10-CM | POA: Diagnosis not present

## 2021-10-26 DIAGNOSIS — J069 Acute upper respiratory infection, unspecified: Secondary | ICD-10-CM | POA: Diagnosis not present

## 2021-10-26 DIAGNOSIS — M549 Dorsalgia, unspecified: Secondary | ICD-10-CM | POA: Diagnosis not present

## 2021-10-26 DIAGNOSIS — Z6824 Body mass index (BMI) 24.0-24.9, adult: Secondary | ICD-10-CM | POA: Diagnosis not present

## 2021-10-26 DIAGNOSIS — I700 Atherosclerosis of aorta: Secondary | ICD-10-CM | POA: Diagnosis not present

## 2021-10-26 DIAGNOSIS — G43909 Migraine, unspecified, not intractable, without status migrainosus: Secondary | ICD-10-CM | POA: Diagnosis not present

## 2021-10-26 DIAGNOSIS — R7989 Other specified abnormal findings of blood chemistry: Secondary | ICD-10-CM | POA: Diagnosis not present

## 2021-10-27 DIAGNOSIS — M47817 Spondylosis without myelopathy or radiculopathy, lumbosacral region: Secondary | ICD-10-CM | POA: Diagnosis not present

## 2021-10-27 DIAGNOSIS — G894 Chronic pain syndrome: Secondary | ICD-10-CM | POA: Diagnosis not present

## 2021-10-27 DIAGNOSIS — R200 Anesthesia of skin: Secondary | ICD-10-CM | POA: Diagnosis not present

## 2021-10-27 DIAGNOSIS — M79604 Pain in right leg: Secondary | ICD-10-CM | POA: Diagnosis not present

## 2022-06-22 ENCOUNTER — Other Ambulatory Visit (HOSPITAL_COMMUNITY): Payer: Self-pay

## 2022-06-23 ENCOUNTER — Other Ambulatory Visit (HOSPITAL_COMMUNITY): Payer: Self-pay

## 2022-06-23 ENCOUNTER — Other Ambulatory Visit: Payer: Self-pay

## 2022-06-23 MED ORDER — OXYCODONE-ACETAMINOPHEN 10-325 MG PO TABS
ORAL_TABLET | ORAL | 0 refills | Status: AC
  Filled 2022-06-23: qty 120, 30d supply, fill #0

## 2022-06-23 MED ORDER — AMLODIPINE BESYLATE 5 MG PO TABS
5.0000 mg | ORAL_TABLET | Freq: Every day | ORAL | 3 refills | Status: AC
  Filled 2022-06-23: qty 30, 30d supply, fill #0

## 2022-07-08 ENCOUNTER — Other Ambulatory Visit (HOSPITAL_BASED_OUTPATIENT_CLINIC_OR_DEPARTMENT_OTHER): Payer: Self-pay

## 2022-07-23 DIAGNOSIS — M792 Neuralgia and neuritis, unspecified: Secondary | ICD-10-CM | POA: Diagnosis not present

## 2022-07-23 DIAGNOSIS — I10 Essential (primary) hypertension: Secondary | ICD-10-CM | POA: Diagnosis not present

## 2022-07-23 DIAGNOSIS — E785 Hyperlipidemia, unspecified: Secondary | ICD-10-CM | POA: Diagnosis not present

## 2022-07-23 DIAGNOSIS — Z79899 Other long term (current) drug therapy: Secondary | ICD-10-CM | POA: Diagnosis not present

## 2022-07-23 DIAGNOSIS — Z789 Other specified health status: Secondary | ICD-10-CM | POA: Diagnosis not present

## 2022-07-23 DIAGNOSIS — Z0130 Encounter for examination of blood pressure without abnormal findings: Secondary | ICD-10-CM | POA: Diagnosis not present

## 2022-07-23 DIAGNOSIS — R7303 Prediabetes: Secondary | ICD-10-CM | POA: Diagnosis not present

## 2022-07-23 DIAGNOSIS — R030 Elevated blood-pressure reading, without diagnosis of hypertension: Secondary | ICD-10-CM | POA: Diagnosis not present

## 2022-07-25 DIAGNOSIS — Z79899 Other long term (current) drug therapy: Secondary | ICD-10-CM | POA: Diagnosis not present

## 2023-04-20 DIAGNOSIS — Z79899 Other long term (current) drug therapy: Secondary | ICD-10-CM | POA: Diagnosis not present

## 2023-04-20 DIAGNOSIS — M545 Low back pain, unspecified: Secondary | ICD-10-CM | POA: Diagnosis not present

## 2023-04-20 DIAGNOSIS — R892 Abnormal level of other drugs, medicaments and biological substances in specimens from other organs, systems and tissues: Secondary | ICD-10-CM | POA: Diagnosis not present

## 2023-04-20 DIAGNOSIS — G8929 Other chronic pain: Secondary | ICD-10-CM | POA: Diagnosis not present

## 2023-04-20 DIAGNOSIS — M62838 Other muscle spasm: Secondary | ICD-10-CM | POA: Diagnosis not present

## 2023-04-24 DIAGNOSIS — Z79899 Other long term (current) drug therapy: Secondary | ICD-10-CM | POA: Diagnosis not present

## 2023-04-24 NOTE — Telephone Encounter (Signed)
 Pt hasn't been seen in 3 years and she's requesting orders for trach supplies. I advised to go to ED for urgent trach changes until she can get more supplies since provider and nurse are out this week. Sent message to provider.

## 2023-05-23 DIAGNOSIS — M545 Low back pain, unspecified: Secondary | ICD-10-CM | POA: Diagnosis not present

## 2023-05-23 DIAGNOSIS — Z79899 Other long term (current) drug therapy: Secondary | ICD-10-CM | POA: Diagnosis not present

## 2023-05-23 DIAGNOSIS — R892 Abnormal level of other drugs, medicaments and biological substances in specimens from other organs, systems and tissues: Secondary | ICD-10-CM | POA: Diagnosis not present

## 2023-05-23 DIAGNOSIS — G8929 Other chronic pain: Secondary | ICD-10-CM | POA: Diagnosis not present

## 2023-05-23 DIAGNOSIS — M62838 Other muscle spasm: Secondary | ICD-10-CM | POA: Diagnosis not present

## 2023-05-29 DIAGNOSIS — J309 Allergic rhinitis, unspecified: Secondary | ICD-10-CM | POA: Diagnosis not present

## 2023-05-29 DIAGNOSIS — Z681 Body mass index (BMI) 19 or less, adult: Secondary | ICD-10-CM | POA: Diagnosis not present

## 2023-05-29 DIAGNOSIS — Z930 Tracheostomy status: Secondary | ICD-10-CM | POA: Diagnosis not present

## 2023-05-29 DIAGNOSIS — G629 Polyneuropathy, unspecified: Secondary | ICD-10-CM | POA: Diagnosis not present

## 2023-05-29 DIAGNOSIS — G43909 Migraine, unspecified, not intractable, without status migrainosus: Secondary | ICD-10-CM | POA: Diagnosis not present

## 2023-05-29 DIAGNOSIS — I10 Essential (primary) hypertension: Secondary | ICD-10-CM | POA: Diagnosis not present

## 2023-05-29 DIAGNOSIS — M159 Polyosteoarthritis, unspecified: Secondary | ICD-10-CM | POA: Diagnosis not present

## 2023-06-12 DIAGNOSIS — M159 Polyosteoarthritis, unspecified: Secondary | ICD-10-CM | POA: Diagnosis not present

## 2023-06-12 DIAGNOSIS — G629 Polyneuropathy, unspecified: Secondary | ICD-10-CM | POA: Diagnosis not present

## 2023-06-12 DIAGNOSIS — J309 Allergic rhinitis, unspecified: Secondary | ICD-10-CM | POA: Diagnosis not present

## 2023-06-12 DIAGNOSIS — I10 Essential (primary) hypertension: Secondary | ICD-10-CM | POA: Diagnosis not present

## 2023-06-12 DIAGNOSIS — Z681 Body mass index (BMI) 19 or less, adult: Secondary | ICD-10-CM | POA: Diagnosis not present

## 2023-06-12 DIAGNOSIS — Z930 Tracheostomy status: Secondary | ICD-10-CM | POA: Diagnosis not present

## 2023-06-12 DIAGNOSIS — G43909 Migraine, unspecified, not intractable, without status migrainosus: Secondary | ICD-10-CM | POA: Diagnosis not present

## 2023-06-15 DIAGNOSIS — G43909 Migraine, unspecified, not intractable, without status migrainosus: Secondary | ICD-10-CM | POA: Diagnosis not present

## 2023-06-15 DIAGNOSIS — M159 Polyosteoarthritis, unspecified: Secondary | ICD-10-CM | POA: Diagnosis not present

## 2023-06-15 DIAGNOSIS — I10 Essential (primary) hypertension: Secondary | ICD-10-CM | POA: Diagnosis not present

## 2023-06-15 DIAGNOSIS — G629 Polyneuropathy, unspecified: Secondary | ICD-10-CM | POA: Diagnosis not present

## 2023-06-15 DIAGNOSIS — J309 Allergic rhinitis, unspecified: Secondary | ICD-10-CM | POA: Diagnosis not present

## 2023-06-15 DIAGNOSIS — Z681 Body mass index (BMI) 19 or less, adult: Secondary | ICD-10-CM | POA: Diagnosis not present

## 2023-06-15 DIAGNOSIS — Z930 Tracheostomy status: Secondary | ICD-10-CM | POA: Diagnosis not present

## 2023-06-20 DIAGNOSIS — G629 Polyneuropathy, unspecified: Secondary | ICD-10-CM | POA: Diagnosis not present

## 2023-06-20 DIAGNOSIS — Z930 Tracheostomy status: Secondary | ICD-10-CM | POA: Diagnosis not present

## 2023-06-20 DIAGNOSIS — I10 Essential (primary) hypertension: Secondary | ICD-10-CM | POA: Diagnosis not present

## 2023-06-20 DIAGNOSIS — Z681 Body mass index (BMI) 19 or less, adult: Secondary | ICD-10-CM | POA: Diagnosis not present

## 2023-06-20 DIAGNOSIS — J309 Allergic rhinitis, unspecified: Secondary | ICD-10-CM | POA: Diagnosis not present

## 2023-06-20 DIAGNOSIS — G43909 Migraine, unspecified, not intractable, without status migrainosus: Secondary | ICD-10-CM | POA: Diagnosis not present

## 2023-06-20 DIAGNOSIS — M159 Polyosteoarthritis, unspecified: Secondary | ICD-10-CM | POA: Diagnosis not present

## 2023-07-11 DIAGNOSIS — G43909 Migraine, unspecified, not intractable, without status migrainosus: Secondary | ICD-10-CM | POA: Diagnosis not present

## 2023-07-11 DIAGNOSIS — J309 Allergic rhinitis, unspecified: Secondary | ICD-10-CM | POA: Diagnosis not present

## 2023-07-11 DIAGNOSIS — Z681 Body mass index (BMI) 19 or less, adult: Secondary | ICD-10-CM | POA: Diagnosis not present

## 2023-07-11 DIAGNOSIS — I10 Essential (primary) hypertension: Secondary | ICD-10-CM | POA: Diagnosis not present

## 2023-07-11 DIAGNOSIS — Z930 Tracheostomy status: Secondary | ICD-10-CM | POA: Diagnosis not present

## 2023-07-11 DIAGNOSIS — M159 Polyosteoarthritis, unspecified: Secondary | ICD-10-CM | POA: Diagnosis not present

## 2023-07-11 DIAGNOSIS — G629 Polyneuropathy, unspecified: Secondary | ICD-10-CM | POA: Diagnosis not present
